# Patient Record
Sex: Female | Born: 1950
Health system: Southern US, Community
[De-identification: ages and names within clinical notes are randomized; demographics above are authoritative.]

## PROBLEM LIST (undated history)

## (undated) DIAGNOSIS — Z8269 Family history of other diseases of the musculoskeletal system and connective tissue: Secondary | ICD-10-CM

## (undated) DIAGNOSIS — H269 Unspecified cataract: Secondary | ICD-10-CM

## (undated) DIAGNOSIS — Z87410 Personal history of cervical dysplasia: Secondary | ICD-10-CM

## (undated) DIAGNOSIS — M19041 Primary osteoarthritis, right hand: Secondary | ICD-10-CM

## (undated) DIAGNOSIS — M3501 Sicca syndrome with keratoconjunctivitis: Secondary | ICD-10-CM

## (undated) DIAGNOSIS — Z8659 Personal history of other mental and behavioral disorders: Secondary | ICD-10-CM

## (undated) DIAGNOSIS — M1990 Unspecified osteoarthritis, unspecified site: Secondary | ICD-10-CM

## (undated) DIAGNOSIS — F32A Depression, unspecified: Secondary | ICD-10-CM

## (undated) DIAGNOSIS — F329 Major depressive disorder, single episode, unspecified: Secondary | ICD-10-CM

## (undated) DIAGNOSIS — R0789 Other chest pain: Secondary | ICD-10-CM

## (undated) DIAGNOSIS — F419 Anxiety disorder, unspecified: Secondary | ICD-10-CM

## (undated) DIAGNOSIS — E785 Hyperlipidemia, unspecified: Secondary | ICD-10-CM

## (undated) DIAGNOSIS — Z87891 Personal history of nicotine dependence: Secondary | ICD-10-CM

## (undated) DIAGNOSIS — T7840XA Allergy, unspecified, initial encounter: Secondary | ICD-10-CM

## (undated) DIAGNOSIS — G4730 Sleep apnea, unspecified: Secondary | ICD-10-CM

## (undated) DIAGNOSIS — J309 Allergic rhinitis, unspecified: Secondary | ICD-10-CM

## (undated) DIAGNOSIS — M797 Fibromyalgia: Secondary | ICD-10-CM

## (undated) DIAGNOSIS — R0602 Shortness of breath: Secondary | ICD-10-CM

## (undated) DIAGNOSIS — E7800 Pure hypercholesterolemia, unspecified: Secondary | ICD-10-CM

## (undated) DIAGNOSIS — Z8639 Personal history of other endocrine, nutritional and metabolic disease: Secondary | ICD-10-CM

## (undated) DIAGNOSIS — K219 Gastro-esophageal reflux disease without esophagitis: Secondary | ICD-10-CM

## (undated) DIAGNOSIS — M7061 Trochanteric bursitis, right hip: Secondary | ICD-10-CM

## (undated) DIAGNOSIS — M19042 Primary osteoarthritis, left hand: Secondary | ICD-10-CM

## (undated) HISTORY — PX: LYMPH NODE BIOPSY: SHX201

## (undated) HISTORY — DX: Other chest pain: R07.89

## (undated) HISTORY — PX: HYSTEROTOMY: SHX1776

## (undated) HISTORY — DX: Allergic rhinitis, unspecified: J30.9

## (undated) HISTORY — DX: Depression, unspecified: F32.A

## (undated) HISTORY — DX: Unspecified osteoarthritis, unspecified site: M19.90

## (undated) HISTORY — DX: Personal history of other endocrine, nutritional and metabolic disease: Z86.39

## (undated) HISTORY — DX: Fibromyalgia: M79.7

## (undated) HISTORY — DX: Primary osteoarthritis, left hand: M19.042

## (undated) HISTORY — DX: Gastro-esophageal reflux disease without esophagitis: K21.9

## (undated) HISTORY — DX: Allergy, unspecified, initial encounter: T78.40XA

## (undated) HISTORY — DX: Primary osteoarthritis, right hand: M19.041

## (undated) HISTORY — DX: Sicca syndrome with keratoconjunctivitis: M35.01

## (undated) HISTORY — DX: Family history of other diseases of the musculoskeletal system and connective tissue: Z82.69

## (undated) HISTORY — DX: Pure hypercholesterolemia, unspecified: E78.00

## (undated) HISTORY — DX: Trochanteric bursitis, right hip: M70.61

## (undated) HISTORY — DX: Shortness of breath: R06.02

## (undated) HISTORY — DX: Unspecified cataract: H26.9

## (undated) HISTORY — DX: Hyperlipidemia, unspecified: E78.5

## (undated) HISTORY — DX: Major depressive disorder, single episode, unspecified: F32.9

## (undated) HISTORY — PX: EYE SURGERY: SHX253

## (undated) HISTORY — DX: Personal history of other mental and behavioral disorders: Z86.59

## (undated) HISTORY — DX: Sleep apnea, unspecified: G47.30

## (undated) HISTORY — DX: Personal history of cervical dysplasia: Z87.410

## (undated) HISTORY — PX: KNEE SURGERY: SHX244

## (undated) HISTORY — DX: Personal history of nicotine dependence: Z87.891

## (undated) HISTORY — DX: Anxiety disorder, unspecified: F41.9

---

## 2010-09-11 ENCOUNTER — Encounter
Admission: RE | Admit: 2010-09-11 | Discharge: 2010-09-11 | Payer: Self-pay | Source: Home / Self Care | Attending: Obstetrics and Gynecology | Admitting: Obstetrics and Gynecology

## 2011-11-05 ENCOUNTER — Other Ambulatory Visit: Payer: Self-pay | Admitting: Obstetrics and Gynecology

## 2011-11-05 DIAGNOSIS — Z1231 Encounter for screening mammogram for malignant neoplasm of breast: Secondary | ICD-10-CM

## 2011-11-05 DIAGNOSIS — Z13820 Encounter for screening for osteoporosis: Secondary | ICD-10-CM

## 2011-11-20 ENCOUNTER — Ambulatory Visit
Admission: RE | Admit: 2011-11-20 | Discharge: 2011-11-20 | Disposition: A | Discharge status: Home / Self Care | Payer: BC Managed Care – PPO | Source: Ambulatory Visit | Attending: Obstetrics and Gynecology | Admitting: Obstetrics and Gynecology

## 2011-11-20 DIAGNOSIS — Z780 Asymptomatic menopausal state: Secondary | ICD-10-CM

## 2011-11-20 DIAGNOSIS — Z1231 Encounter for screening mammogram for malignant neoplasm of breast: Secondary | ICD-10-CM

## 2011-12-09 ENCOUNTER — Other Ambulatory Visit: Payer: Self-pay | Admitting: Orthopaedic Surgery

## 2011-12-09 DIAGNOSIS — M542 Cervicalgia: Secondary | ICD-10-CM

## 2011-12-12 ENCOUNTER — Ambulatory Visit
Admission: RE | Admit: 2011-12-12 | Discharge: 2011-12-12 | Disposition: A | Discharge status: Home / Self Care | Payer: BC Managed Care – PPO | Source: Ambulatory Visit | Attending: Orthopaedic Surgery | Admitting: Orthopaedic Surgery

## 2011-12-12 DIAGNOSIS — M542 Cervicalgia: Secondary | ICD-10-CM

## 2013-03-08 ENCOUNTER — Other Ambulatory Visit: Payer: Self-pay

## 2013-03-08 DIAGNOSIS — Z1231 Encounter for screening mammogram for malignant neoplasm of breast: Secondary | ICD-10-CM

## 2013-04-01 ENCOUNTER — Ambulatory Visit
Admission: RE | Admit: 2013-04-01 | Discharge: 2013-04-01 | Disposition: A | Discharge status: Home / Self Care | Payer: BC Managed Care – PPO | Source: Ambulatory Visit

## 2013-04-01 DIAGNOSIS — Z1231 Encounter for screening mammogram for malignant neoplasm of breast: Secondary | ICD-10-CM

## 2014-04-01 ENCOUNTER — Other Ambulatory Visit: Payer: Self-pay

## 2014-04-01 DIAGNOSIS — Z1231 Encounter for screening mammogram for malignant neoplasm of breast: Secondary | ICD-10-CM

## 2014-04-11 ENCOUNTER — Ambulatory Visit
Admission: RE | Admit: 2014-04-11 | Discharge: 2014-04-11 | Disposition: A | Discharge status: Home / Self Care | Payer: BC Managed Care – PPO | Source: Ambulatory Visit

## 2014-04-11 DIAGNOSIS — Z1231 Encounter for screening mammogram for malignant neoplasm of breast: Secondary | ICD-10-CM

## 2016-04-02 DIAGNOSIS — E7800 Pure hypercholesterolemia, unspecified: Secondary | ICD-10-CM | POA: Insufficient documentation

## 2016-04-02 DIAGNOSIS — F419 Anxiety disorder, unspecified: Secondary | ICD-10-CM

## 2016-04-02 DIAGNOSIS — Z87410 Personal history of cervical dysplasia: Secondary | ICD-10-CM

## 2016-04-02 DIAGNOSIS — E785 Hyperlipidemia, unspecified: Secondary | ICD-10-CM | POA: Insufficient documentation

## 2016-04-02 HISTORY — DX: Anxiety disorder, unspecified: F41.9

## 2016-04-02 HISTORY — DX: Personal history of cervical dysplasia: Z87.410

## 2016-04-02 HISTORY — DX: Pure hypercholesterolemia, unspecified: E78.00

## 2016-04-03 ENCOUNTER — Other Ambulatory Visit: Payer: Self-pay | Admitting: Obstetrics and Gynecology

## 2016-04-03 DIAGNOSIS — Z1231 Encounter for screening mammogram for malignant neoplasm of breast: Secondary | ICD-10-CM

## 2016-04-10 ENCOUNTER — Ambulatory Visit
Admission: RE | Admit: 2016-04-10 | Discharge: 2016-04-10 | Disposition: A | Discharge status: Home / Self Care | Payer: BLUE CROSS/BLUE SHIELD | Source: Ambulatory Visit | Attending: Obstetrics and Gynecology | Admitting: Obstetrics and Gynecology

## 2016-04-10 DIAGNOSIS — Z1231 Encounter for screening mammogram for malignant neoplasm of breast: Secondary | ICD-10-CM

## 2017-09-29 ENCOUNTER — Ambulatory Visit (INDEPENDENT_AMBULATORY_CARE_PROVIDER_SITE_OTHER): Payer: Medicare Other

## 2017-09-29 ENCOUNTER — Encounter (INDEPENDENT_AMBULATORY_CARE_PROVIDER_SITE_OTHER): Payer: Self-pay | Admitting: Orthopaedic Surgery

## 2017-09-29 ENCOUNTER — Ambulatory Visit (INDEPENDENT_AMBULATORY_CARE_PROVIDER_SITE_OTHER): Payer: Medicare Other | Admitting: Orthopaedic Surgery

## 2017-09-29 VITALS — BP 99/67 | HR 59 | Resp 16 | Ht 62.0 in | Wt 160.0 lb

## 2017-09-29 DIAGNOSIS — M545 Low back pain, unspecified: Secondary | ICD-10-CM

## 2017-09-29 NOTE — Progress Notes (Signed)
Office Visit Note   Patient: Kaitlyn Hudson           Date of Birth: [birth year]           MRN: [id number] Visit Date: 09/29/2017              Requested by: No referring provider defined for this encounter. PCP: Nonnie Done., MD   Assessment & Plan: Visit Diagnoses:  1. Lumbar pain     Plan: Recurrent episodes of low back pain associated with right buttock and hip discomfort. Has significant degenerative changes in the lower lumbar spine. I think at this point it's time to obtain an MRI scan. Long discussion with Kaitlyn Hudson who agrees. We'll set this up. plan to see her back  thereafter and consider treatment options.  Follow-Up Instructions: No Follow-up on file.   Orders:  Orders Placed This Encounter  Procedures  . XR Lumbar Spine 2-3 Views  . MR Lumbar Spine w/o contrast   No orders of the defined types were placed in this encounter.     Procedures: No procedures performed   Clinical Data: No additional findings.   Subjective: Chief Complaint  Patient presents with  . Right Hip - Pain    Kaitlyn Hudson is a 67 y o here today for chronic Right hip and lumbar pain.   Kaitlyn Hudson has been seen on number of occasions over the past several years for problems referable to her back buttock and right hip. Cortisone injection over the greater trochanter her hip, provided little if any relief. She has trouble when she sits for a length of time. Her pain seems to originate somewhere in the lower lumbar spine and radiates to the right side of her buttock and her right leg as far distally as the right knee. She does not have any groin pain. Does not walk with a limp. Prior x-rays of her pelvis did not reveal any degenerative changes of hip joints. She was last seen in 2016 when she had the right trochanteric per the dictation there were some degenerative changes at L5-S1. She has tried over-the-counter medicines and a back support. Pain is reached a point where it really compromises her  activities. She's been having trouble for over 3-4 years with no prior further advanced diagnostic studies.  HPI  Review of Systems  Constitutional: Negative for chills, fatigue and fever.  Eyes: Negative for itching.  Respiratory: Negative for chest tightness and shortness of breath.   Cardiovascular: Negative for chest pain, palpitations and leg swelling.  Gastrointestinal: Negative for blood in stool, constipation and diarrhea.  Endocrine: Negative for polyuria.  Genitourinary: Negative for dysuria.  Musculoskeletal: Positive for arthralgias and back pain. Negative for joint swelling, neck pain and neck stiffness.  Allergic/Immunologic: Negative for immunocompromised state.  Neurological: Negative for dizziness and numbness.  Hematological: Does not bruise/bleed easily.  Psychiatric/Behavioral: Positive for sleep disturbance. The patient is not nervous/anxious.      Objective: Vital Signs: BP 99/67   Pulse (!) 59   Resp 16   Ht 5\' 2"  (1.575 m)   Wt 160 lb (72.6 kg)   BMI 29.26 kg/m   Physical Exam  Ortho Exam awake alert and oriented 3. Comfortable sitting. Pleasant. Straight leg raise minimally positive for back pain and not leg pain more on the right than the left. Reflexes symmetrical. Motor and sensory exam intact. No significant tenderness over either greater trochanter. Painless range of motion of both hips. Good strength.  Specialty  Comments:  No specialty comments available.  Imaging: Xr Lumbar Spine 2-3 Views  Result Date: 09/29/2017 Films of the lumbar spine obtained in 2 projections.  no films for comparison. There is significant decrease in the disc space height at L5-S1. Diffuse calcification the abdominal aorta without aneurysmal dilatation. No evidence of listhesis. Diffuse facet joint sclerosis at L5-S1    PMFS History: There are no active problems to display for this patient.  Past Medical History:  Diagnosis Date  . Osteoarthritis     History  reviewed. No pertinent family history.  Past Surgical History:  Procedure Laterality Date  . HYSTEROTOMY    . KNEE ARTHROSCOPY    . KNEE SURGERY     Social History   Occupational History  . Not on file  Tobacco Use  . Smoking status: Former Games developermoker  . Smokeless tobacco: Never Used  Substance and Sexual Activity  . Alcohol use: No    Frequency: Never  . Drug use: No  . Sexual activity: Not on file

## 2017-10-07 ENCOUNTER — Ambulatory Visit
Admission: RE | Admit: 2017-10-07 | Discharge: 2017-10-07 | Disposition: A | Discharge status: Home / Self Care | Payer: Medicare Other | Source: Ambulatory Visit | Attending: Orthopaedic Surgery | Admitting: Orthopaedic Surgery

## 2017-10-07 DIAGNOSIS — M545 Low back pain, unspecified: Secondary | ICD-10-CM

## 2017-10-09 ENCOUNTER — Other Ambulatory Visit (INDEPENDENT_AMBULATORY_CARE_PROVIDER_SITE_OTHER): Payer: Self-pay | Admitting: *Deleted

## 2017-10-09 ENCOUNTER — Encounter (INDEPENDENT_AMBULATORY_CARE_PROVIDER_SITE_OTHER): Payer: Self-pay | Admitting: Orthopaedic Surgery

## 2017-10-09 ENCOUNTER — Ambulatory Visit (INDEPENDENT_AMBULATORY_CARE_PROVIDER_SITE_OTHER): Payer: Medicare Other | Admitting: Orthopaedic Surgery

## 2017-10-09 DIAGNOSIS — M545 Low back pain, unspecified: Secondary | ICD-10-CM

## 2017-10-09 DIAGNOSIS — M25551 Pain in right hip: Secondary | ICD-10-CM | POA: Diagnosis not present

## 2017-10-09 MED ORDER — LIDOCAINE HCL 1 % IJ SOLN
2.0000 mL | INTRAMUSCULAR | Status: AC | PRN
  Administered 2017-10-09: 2 mL

## 2017-10-09 MED ORDER — BUPIVACAINE HCL 0.5 % IJ SOLN
2.0000 mL | INTRAMUSCULAR | Status: AC | PRN
  Administered 2017-10-09: 2 mL via INTRA_ARTICULAR

## 2017-10-09 MED ORDER — METHYLPREDNISOLONE ACETATE 40 MG/ML IJ SUSP
80.0000 mg | INTRAMUSCULAR | Status: AC | PRN
  Administered 2017-10-09: 80 mg

## 2017-10-09 NOTE — Progress Notes (Signed)
Office Visit Note   Patient: Kaitlyn Hudson           Date of Birth: 04/07/1951           MRN: [id number] Visit Date: 10/09/2017              Requested by: Nonnie DoneSlatosky, John J., MD [address] [place], [place] [id number] PCP: Nonnie DoneSlatosky, John J., MD   Assessment & Plan: Visit Diagnoses:  1. Pain of right hip joint     Plan: Pain right hip consistent with greater trochanteric bursitis. Has had a prior diagnosis of the same with successful cortisone injections. MRI of lumbar spine reveals some mild degenerative changes throughout the lumbar spine I discussed this in detail with Kaitlyn Hudson. She like to try a cortisone injection over the greater trochanter.  Follow-Up Instructions: Return if symptoms worsen or fail to improve.   Orders:  No orders of the defined types were placed in this encounter.  No orders of the defined types were placed in this encounter.     Procedures: Large Joint Inj: R greater trochanter on 10/09/2017 3:49 PM Indications: pain and diagnostic evaluation Details: 25 G 1.5 in needle  Arthrogram: No  Medications: 2 mL lidocaine 1 %; 2 mL bupivacaine 0.5 %; 80 mg methylPREDNISolone acetate 40 MG/ML Procedure, treatment alternatives, risks and benefits explained, specific risks discussed. Consent was given by the patient. Immediately prior to procedure a time out was called to verify the correct patient, procedure, equipment, support staff and site/side marked as required. Patient was prepped and draped in the usual sterile fashion.       Clinical Data: No additional findings.   Subjective: No chief complaint on file. Has had some chronic issues with her lumbar spine. I obtained an MRI scan and she's here for review. This demonstrates some mild degenerative changes throughout the lumbar spine but greater in the lower spine region. She also was had recurrent pain directly over the greater trochanter of her right hip. She believes that this is recurrent  Bursitis.  She's had cortisone in an area before with excellent relief. Spend 4 years  HPI  Review of Systems   Objective: Vital Signs: There were no vitals taken for this visit.  Physical Exam  Ortho Exam awake alert and oriented 3. Comfortable sitting. Tenderness directly over the greater trochanter of her right hip. No pain in the groin with range of motion. Straight leg raise negative. No percussible tenderness lumbar spine. Skin intact. Neurovascular exam intact  Specialty Comments:  No specialty comments available.  Imaging: No results found.   PMFS History: There are no active problems to display for this patient.  Past Medical History:  Diagnosis Date  . Osteoarthritis     History reviewed. No pertinent family history.  Past Surgical History:  Procedure Laterality Date  . HYSTEROTOMY    . KNEE ARTHROSCOPY    . KNEE SURGERY     Social History   Occupational History  . Not on file  Tobacco Use  . Smoking status: Former Games developermoker  . Smokeless tobacco: Never Used  Substance and Sexual Activity  . Alcohol use: No    Frequency: Never  . Drug use: No  . Sexual activity: Not on file     Valeria BatmanPeter W Kaede Clendenen, MD   Note - This record has been created using [employer] software.  Chart creation errors have been sought, but may not always  have been located. Such creation errors do not reflect on  the standard of medical care.

## 2018-01-02 ENCOUNTER — Ambulatory Visit (INDEPENDENT_AMBULATORY_CARE_PROVIDER_SITE_OTHER): Payer: Medicare Other | Admitting: Orthopaedic Surgery

## 2018-01-09 ENCOUNTER — Encounter (INDEPENDENT_AMBULATORY_CARE_PROVIDER_SITE_OTHER): Payer: Self-pay | Admitting: Orthopaedic Surgery

## 2018-01-09 ENCOUNTER — Ambulatory Visit (INDEPENDENT_AMBULATORY_CARE_PROVIDER_SITE_OTHER): Payer: Medicare Other | Admitting: Orthopaedic Surgery

## 2018-01-09 VITALS — BP 129/70 | HR 75 | Resp 18 | Ht 63.0 in | Wt 170.0 lb

## 2018-01-09 DIAGNOSIS — M7061 Trochanteric bursitis, right hip: Secondary | ICD-10-CM | POA: Diagnosis not present

## 2018-01-09 HISTORY — DX: Trochanteric bursitis, right hip: M70.61

## 2018-01-09 MED ORDER — LIDOCAINE HCL 1 % IJ SOLN
2.0000 mL | INTRAMUSCULAR | Status: AC | PRN
  Administered 2018-01-09: 2 mL

## 2018-01-09 MED ORDER — METHYLPREDNISOLONE ACETATE 40 MG/ML IJ SUSP
80.0000 mg | INTRAMUSCULAR | Status: AC | PRN
  Administered 2018-01-09: 80 mg

## 2018-01-09 MED ORDER — BUPIVACAINE HCL 0.5 % IJ SOLN
2.0000 mL | INTRAMUSCULAR | Status: AC | PRN
Start: 2018-01-09 — End: 2018-01-09
  Administered 2018-01-09: 2 mL via INTRA_ARTICULAR

## 2018-01-09 NOTE — Progress Notes (Signed)
Office Visit Note   Patient: Kaitlyn Hudson           Date of Birth: 17-Jan-1951           MRN: [id number] Visit Date: 01/09/2018              Requested by: Nonnie Done., MD [address] [place], [place] [address] PCP: Nonnie Done., MD   Assessment & Plan: Visit  Diagnoses:  1. Trochanteric bursitis, right hip     Plan: Recurrent pain over the greater trochanter right hip.  Excellent response to cortisone 3 months ago.  Would like another injection.  Prior MRI scan of lumbar spine reveals some mild spondylosis and foraminal stenosis.. Limited views of right hip did not reveal any degenerative change.  No recent history of injury or trauma.  Will reinject hip and return as needed  Follow-Up Instructions: Return if symptoms worsen or fail to improve.   Orders:  No orders of the defined types were placed in this encounter.  No orders of the defined types were placed in this encounter.     Procedures: Large Joint Inj: R greater trochanter on 01/09/2018 11:28 AM Indications: pain and diagnostic evaluation Details: 25 G 1.5 in needle  Arthrogram: No  Medications: 2 mL lidocaine 1 %; 2 mL bupivacaine 0.5 %; 80 mg methylPREDNISolone acetate 40 MG/ML Procedure, treatment alternatives, risks and benefits explained, specific risks discussed. Consent was given by the patient. Immediately prior to procedure a time out was called to verify the correct patient, procedure, equipment, support staff and site/side marked as required. Patient was prepped and draped in the usual sterile fashion.       Clinical Data: No additional findings.   Subjective: Chief Complaint  Patient presents with  . Right Hip - Pain  . Follow-up    R HIP INJECTION, LAST ONE WAS FEB/2019, NO INJURY  Recurrent pain over the greater trochanter right hip.  Prior injection 3 months ago alleviated all of her pain.  Just had recurrent pain without injury or trauma.  No numbness or tingling.  No related groin pain.  HPI  Review of Systems  Constitutional: Negative for fatigue and fever.  HENT: Negative for ear pain.   Eyes: Negative for pain.  Respiratory: Negative for cough and shortness of breath.   Cardiovascular: Negative for leg swelling.  Gastrointestinal: Negative for  constipation and diarrhea.  Genitourinary: Negative for difficulty urinating.  Musculoskeletal: Positive for back pain and neck pain.  Skin: Negative for rash.  Allergic/Immunologic: Negative for food allergies.  Neurological: Negative for weakness and numbness.  Hematological: Does not bruise/bleed easily.  Psychiatric/Behavioral: Positive for sleep disturbance.     Objective: Vital Signs: BP 129/70 (BP Location: Left Arm, Patient Position: Sitting, Cuff Size: Normal)   Pulse 75   Resp 18   Ht  (1.6 m)   Wt 170 lb (77.1 kg)   BMI 30.11 kg/m   Physical Exam  Constitutional: She is oriented to person, place, and time. She appears well-developed and well-nourished.  HENT:  Mouth/Throat: Oropharynx is clear and moist.  Eyes: Pupils are equal, round, and reactive to light. EOM are normal.  Pulmonary/Chest: Effort normal.  Neurological: She is alert and oriented to person, place, and time.  Skin: Skin is warm and dry.  Psychiatric: She has a normal mood and affect. Her behavior is normal.    Ortho Exam awake alert and oriented x3.  Comfortable sitting.  No pain with internal/external  rotation of right hip.  Tenderness directly over the greater trochanter right hip skin intact.  Straight leg raise negative.  No percussible tenderness of lumbar spine  Specialty Comments:  No specialty comments available.  Imaging: No results found.   PMFS History: Patient Active Problem List   Diagnosis Date Noted  . Trochanteric bursitis, right hip 01/09/2018   Past Medical History:  Diagnosis Date  . Osteoarthritis     History reviewed. No pertinent family history.  Past Surgical History:  Procedure Laterality Date  . HYSTEROTOMY    . KNEE ARTHROSCOPY    . KNEE SURGERY     Social History   Occupational History  . Not on file  Tobacco Use  . Smoking status: Former Games developer  . Smokeless tobacco: Current User    Types: Chew  Substance and Sexual Activity  . Alcohol use: No      Frequency: Never  . Drug use: No  . Sexual activity: Not on file

## 2018-03-04 NOTE — Progress Notes (Signed)
Office Visit Note  Patient: Kaitlyn Hudson             Date of Birth: 1951-03-22         Kaitlyn Hudson  MRN: [id number]             PCP: Nonnie DoneSlatosky, John J., MD Referring: Nonnie DoneSlatosky, John J., MD Visit Date: 03/16/2018 Occupation: @GUAROCC @    Subjective:  Pain in multiple joints and muscles.  History of Present Illness: Kaitlyn Hudson is a 67 y.o. female seen in consultation per request of her PCP.  According to patient she has had history of fatigue for at least 2 years.  She is also noticed some arthritic changes in her hands over the years and had discomfort in her bilateral CMC joints.  She states she has been having joint pain which moves around for a while.  Approximately a month ago she started experiencing some pain in her neck, jawline, across her shoulders chest area in her rib cage.  She states the pain moves around except that the right shoulder pain and right arm pain has been persistent.  She was seen by her PCP about a month ago when she had labs which were abnormal and she was referred to me for further evaluation.  She also reports having weight gain of 30 pounds in the last 1 year.  She has history of chronic insomnia.  There is positive family history of systemic lupus in her mother.  She was treated with the statins in the past which she has not taken in the last 2 years.  She denies any history of joint swelling.    Activities of Daily Living:  Patient reports morning stiffness for 5  minutes.   Patient Reports nocturnal pain.  Difficulty dressing/grooming: Denies Difficulty climbing stairs: Denies Difficulty getting out of chair: Denies Difficulty using hands for taps, buttons, cutlery, and/or writing: Denies   Review of Systems  Constitutional: Positive for fatigue and weight gain.  HENT: Negative for mouth sores, mouth dryness and nose dryness.   Eyes: Positive for dryness. Negative for pain and visual disturbance.  Respiratory: Negative for cough, hemoptysis, shortness of breath  and difficulty breathing.   Cardiovascular: Negative for chest pain, palpitations, hypertension and swelling in legs/feet.  Gastrointestinal: Negative for blood in stool, constipation and diarrhea.  Endocrine: Negative for increased urination.  Genitourinary: Negative for painful urination.  Musculoskeletal: Positive for arthralgias, joint pain, myalgias, morning stiffness, muscle tenderness and myalgias. Negative for joint swelling and muscle weakness.  Skin: Positive for hair loss and sensitivity to sunlight. Negative for color change, pallor, rash, nodules/bumps, skin tightness and ulcers.  Allergic/Immunologic: Negative for susceptible to infections.  Neurological: Positive for headaches. Negative for dizziness, numbness and weakness.  Hematological: Negative for swollen glands.  Psychiatric/Behavioral: Positive for depressed mood. Negative for sleep disturbance. The patient is nervous/anxious.     PMFS History:  Patient Active Problem List   Diagnosis Date Noted  . History of depression 03/16/2018  . History of hyperlipidemia 03/16/2018  . Trochanteric bursitis, right hip 01/09/2018    Past Medical History:  Diagnosis Date  . Osteoarthritis     Family History  Problem Relation Age of Onset  . Lupus Mother   . Osteoarthritis Mother   . Polymyalgia rheumatica Mother   . Osteoarthritis Father    Past Surgical History:  Procedure Laterality Date  . HYSTEROTOMY    . KNEE SURGERY Right   . LYMPH NODE BIOPSY     groin  Social History   Social History Narrative  . Not on file     Objective: Vital Signs: BP 119/74 (BP Location: Right Arm, Patient Position: Sitting, Cuff Size: Normal)   Pulse 74   Resp 14   Ht 5' 2.6" (1.59 m)   Wt 190 lb (86.2 kg)   BMI 34.09 kg/m    Physical Exam  Constitutional: She is oriented to person, place, and time. She appears well-developed and well-nourished.  HENT:  Head: Normocephalic and atraumatic.  Eyes: Conjunctivae and EOM are  normal.  Neck: Normal range of motion.  Cardiovascular: Normal rate, regular rhythm, normal heart sounds and intact distal pulses.  Pulmonary/Chest: Effort normal and breath sounds normal.  Abdominal: Soft. Bowel sounds are normal.  Lymphadenopathy:    She has no cervical adenopathy.  Neurological: She is alert and oriented to person, place, and time.  Skin: Skin is warm and dry. Capillary refill takes less than 2 seconds.  She has hyperemia over bilateral upper extremities.  Psychiatric: She has a normal mood and affect. Her behavior is normal.  Nursing note and vitals reviewed.    Musculoskeletal Exam: C-spine thoracic lumbar spine good range of motion.  She has some discomfort with range of motion of her lumbar spine.  She has discomfort range of motion of her right shoulder.  Elbow joints wrist joints were in good range of motion.  She has tenderness over bilateral CMC joints.  She has DIP and PIP thickening in her bilateral hands.  No synovitis was noted.  Hip joints and knee joints were in good range of motion with no synovitis.  She had tenderness over right trochanteric area.  CDAI Exam: No CDAI exam completed.    Investigation: Findings:  02/20/18: Uric acid 4.7 CBC WNL, sed rate 50, RF 36.4 CRP 1, [place] spotted fever IgG negative, CMP WNL, TSH 3.230, ANA positive, dsDNA 1, RNP 0.6, Smith negative, SCL 70-, Ro positive, La positive, anti-chromatin negative, anti-Jo 1-, anticentromere antibodies negative, parvo B19 negative, Lyme disease antibodies negative     Imaging: Xr Hand 2 View Left  Result Date: 03/16/2018 All PIP and DIP narrowing was noted.  Subluxation of third PIP joint was noted.  No MCP PIP or intercarpal joint space narrowing was noted.  CMC narrowing was noted. Impression: These findings are consistent with osteoarthritis of the hand.  Xr Hand 2 View Right  Result Date: 03/16/2018 All PIP and DIP narrowing was noted.  No MCP joint changes were noted.  No  intercarpal radiocarpal joint space narrowing was noted.  Severe CMC narrowing and spurring was noted. Impression: These findings are consistent with osteoarthritis of the hand.  Xr Shoulder Right  Result Date: 03/16/2018 No glenohumeral joint or acromioclavicular joint space narrowing was noted.  No chondrocalcinosis was noted.   Speciality Comments: No specialty comments available.    Procedures:  No procedures performed Allergies: Codeine   Assessment / Plan:     Visit diagnosis- Positive ANA- 02/20/18: Uric acid 4.7 CBC WNL, sed rate 50, RF 36.4 CRP 1, ANA +, SSA positive, SSB positive.  Patient gives history of fatigue and sicca symptoms.  She also has history of polyarthralgia.  She gives history of polymyalgia.  There is positive family history of systemic lupus in her mother.  She states her mother has been treated with methotrexate.  She had some hyperemia on her extremities.  No synovitis was noted.  I will obtain AVISE study.  Pain in both hands -the clinical findings  are consistent with osteoarthritis.  Plan: XR Hand 2 View Right, XR Hand 2 View Left.  The x-rays were consistent with osteoarthritis of bilateral hands.  Chronic right shoulder pain - Plan: XR Shoulder Right.  The x-ray of the shoulder joint was unremarkable.  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  I do not see any synovitis on examination.  Myalgia-she also complains of muscle pain which moves around.  Other fatigue-she has been experiencing fatigue for some time.  Trochanteric bursitis, right hip-IT band stretches were discussed.  Shortness of breath -she is concerned about shortness of breath.  She states she is was a former smoker.  She would like a referral to pulmonary.  Plan: DG Chest 2 View  Former smoker  History of hyperlipidemia-patient was treated with statins in the past.  She states she has not taken any statins in 2 years.  History of  depression  Dyslipidemia  Family history of systemic lupus erythematosus (SLE) in mother -her mother is treated with methotrexate.   Orders: Orders Placed This Encounter  Procedures  . XR Shoulder Right  . XR Hand 2 View Right  . XR Hand 2 View Left  . DG Chest 2 View  . CK  . Urinalysis, Routine w reflex microscopic  . TSH  . Lupus Anticoagulant Eval w/Reflex  . Serum protein electrophoresis with reflex  . Glucose 6 phosphate dehydrogenase  . Hepatitis B core antibody, IgM  . Hepatitis B surface antigen  . Hepatitis C antibody  . IgG, IgA, IgM  . Sedimentation rate  . Ambulatory referral to Pulmonology   No orders of the defined types were placed in this encounter.   Face-to-face time spent with patient was 50 minutes. Greater than 50% of time was spent in counseling and coordination of care.  Follow-Up Instructions: Return for Autoimmune disease.   Pollyann Savoy, MD  Note - This record has been created using [employer].  Chart creation errors have been sought, but may not always  have been located. Such creation errors do not reflect on  the standard of medical care.

## 2018-03-16 ENCOUNTER — Ambulatory Visit (INDEPENDENT_AMBULATORY_CARE_PROVIDER_SITE_OTHER): Payer: Medicare Other | Admitting: Rheumatology

## 2018-03-16 ENCOUNTER — Encounter: Payer: Self-pay | Admitting: Rheumatology

## 2018-03-16 ENCOUNTER — Ambulatory Visit (INDEPENDENT_AMBULATORY_CARE_PROVIDER_SITE_OTHER): Payer: Self-pay

## 2018-03-16 ENCOUNTER — Ambulatory Visit (INDEPENDENT_AMBULATORY_CARE_PROVIDER_SITE_OTHER): Payer: Medicare Other

## 2018-03-16 VITALS — BP 119/74 | HR 74 | Resp 14 | Ht 62.6 in | Wt 190.0 lb

## 2018-03-16 DIAGNOSIS — Z8659 Personal history of other mental and behavioral disorders: Secondary | ICD-10-CM

## 2018-03-16 DIAGNOSIS — M7061 Trochanteric bursitis, right hip: Secondary | ICD-10-CM

## 2018-03-16 DIAGNOSIS — M79641 Pain in right hand: Secondary | ICD-10-CM

## 2018-03-16 DIAGNOSIS — M79642 Pain in left hand: Secondary | ICD-10-CM

## 2018-03-16 DIAGNOSIS — M791 Myalgia, unspecified site: Secondary | ICD-10-CM

## 2018-03-16 DIAGNOSIS — G8929 Other chronic pain: Secondary | ICD-10-CM

## 2018-03-16 DIAGNOSIS — Z8269 Family history of other diseases of the musculoskeletal system and connective tissue: Secondary | ICD-10-CM

## 2018-03-16 DIAGNOSIS — M2550 Pain in unspecified joint: Secondary | ICD-10-CM | POA: Diagnosis not present

## 2018-03-16 DIAGNOSIS — Z87891 Personal history of nicotine dependence: Secondary | ICD-10-CM | POA: Diagnosis not present

## 2018-03-16 DIAGNOSIS — Z8639 Personal history of other endocrine, nutritional and metabolic disease: Secondary | ICD-10-CM | POA: Insufficient documentation

## 2018-03-16 DIAGNOSIS — E785 Hyperlipidemia, unspecified: Secondary | ICD-10-CM | POA: Diagnosis not present

## 2018-03-16 DIAGNOSIS — M25511 Pain in right shoulder: Secondary | ICD-10-CM

## 2018-03-16 DIAGNOSIS — R0602 Shortness of breath: Secondary | ICD-10-CM

## 2018-03-16 DIAGNOSIS — R768 Other specified abnormal immunological findings in serum: Secondary | ICD-10-CM

## 2018-03-16 DIAGNOSIS — R5383 Other fatigue: Secondary | ICD-10-CM

## 2018-03-16 HISTORY — DX: Personal history of other endocrine, nutritional and metabolic disease: Z86.39

## 2018-03-16 HISTORY — DX: Personal history of other mental and behavioral disorders: Z86.59

## 2018-03-16 NOTE — Patient Instructions (Signed)
Iliotibial Band Syndrome Rehab  Ask your health care provider which exercises are safe for you. Do exercises exactly as told by your health care provider and adjust them as directed. It is normal to feel mild stretching, pulling, tightness, or discomfort as you do these exercises, but you should stop right away if you feel sudden pain or your pain gets worse. Do not begin these exercises until told by your health care provider.  Stretching and range of motion exercises  These exercises warm up your muscles and joints and improve the movement and flexibility of your hip and pelvis.  Exercise A: Quadriceps, prone    1. Lie on your abdomen on a firm surface, such as a bed or padded floor.  2. Bend your left / right knee and hold your ankle. If you cannot reach your ankle or pant leg, loop a belt around your foot and grab the belt instead.  3. Gently pull your heel toward your buttocks. Your knee should not slide out to the side. You should feel a stretch in the front of your thigh and knee.  4. Hold this position for __________ seconds.  Repeat __________ times. Complete this stretch __________ times a day.  Exercise B: Iliotibial band    1. Lie on your side with your left / right leg in the top position.  2. Bend both of your knees and grab your left / right ankle. Stretch out your bottom arm to help you balance.  3. Slowly bring your top knee back so your thigh goes behind your trunk.  4. Slowly lower your top leg toward the floor until you feel a gentle stretch on the outside of your left / right hip and thigh. If you do not feel a stretch and your knee will not fall farther, place the heel of your other foot on top of your knee and pull your knee down toward the floor with your foot.  5. Hold this position for __________ seconds.  Repeat __________ times. Complete this stretch __________ times a day.  Strengthening exercises  These exercises build strength and endurance in your hip and pelvis. Endurance is the  ability to use your muscles for a long time, even after they get tired.  Exercise C: Straight leg raises (  hip abductors)  1. Lie on your side with your left / right leg in the top position. Lie so your head, shoulder, knee, and hip line up. You may bend your bottom knee to help you balance.  2. Roll your hips slightly forward so your hips are stacked directly over each other and your left / right knee is facing forward.  3. Tense the muscles in your outer thigh and lift your top leg 4-6 inches (10-15 cm).  4. Hold this position for __________ seconds.  5. Slowly return to the starting position. Let your muscles relax completely before doing another repetition.  Repeat __________ times. Complete this exercise __________ times a day.  Exercise D: Straight leg raises (  hip extensors)  1. Lie on your abdomen on your bed or a firm surface. You can put a pillow under your hips if that is more comfortable.  2. Bend your left / right knee so your foot is straight up in the air.  3. Squeeze your buttock muscles and lift your left / right thigh off the bed. Do not let your back arch.  4. Tense this muscle as hard as you can without increasing any knee pain.    5. Hold this position for __________ seconds.  6. Slowly lower your leg to the starting position and allow it to relax completely.  Repeat __________ times. Complete this exercise __________ times a day.  Exercise E: Hip hike  1. Stand sideways on a bottom step. Stand on your left / right leg with your other foot unsupported next to the step. You can hold onto the railing or wall if needed for balance.  2. Keep your knees straight and your torso square. Then, lift your left / right hip up toward the ceiling.  3. Slowly let your left / right hip lower toward the floor, past the starting position. Your foot should get closer to the floor. Do not lean or bend your knees.  Repeat __________ times. Complete this exercise __________ times a day.  This information is not  intended to replace advice given to you by your health care provider. Make sure you discuss any questions you have with your health care provider.  Document Released: 08/19/2005 Document Revised: 04/23/2016 Document Reviewed: 07/21/2015  Elsevier Interactive Patient Education © 2018 [employer]

## 2018-03-18 LAB — PROTEIN ELECTROPHORESIS, SERUM, WITH REFLEX
Albumin ELP: 4.6 g/dL (ref 3.8–4.8)
Alpha 1: 0.2 g/dL (ref 0.2–0.3)
Alpha 2: 0.6 g/dL (ref 0.5–0.9)
BETA 2: 0.3 g/dL (ref 0.2–0.5)
Beta Globulin: 0.4 g/dL (ref 0.4–0.6)
GAMMA GLOBULIN: 1.6 g/dL (ref 0.8–1.7)
Total Protein: 7.8 g/dL (ref 6.1–8.1)

## 2018-03-18 LAB — TSH: TSH: 3.64 mIU/L (ref 0.40–4.50)

## 2018-03-18 LAB — LUPUS ANTICOAGULANT EVAL W/ REFLEX
DRVVT: 32 s (ref <=45)
PTT-LA SCREEN: 33 s (ref <=40)

## 2018-03-18 LAB — HEPATITIS B SURFACE ANTIGEN: Hepatitis B Surface Ag: NONREACTIVE

## 2018-03-18 LAB — URINALYSIS, ROUTINE W REFLEX MICROSCOPIC
BILIRUBIN URINE: NEGATIVE
Glucose, UA: NEGATIVE
HGB URINE DIPSTICK: NEGATIVE
KETONES UR: NEGATIVE
Leukocytes, UA: NEGATIVE
Nitrite: NEGATIVE
Protein, ur: NEGATIVE
Specific Gravity, Urine: 1.014 (ref 1.001–1.03)

## 2018-03-18 LAB — GLUCOSE 6 PHOSPHATE DEHYDROGENASE: G-6PDH: 13.7 U/g Hgb (ref 7.0–20.5)

## 2018-03-18 LAB — HEPATITIS C ANTIBODY
Hepatitis C Ab: NONREACTIVE
SIGNAL TO CUT-OFF: 0.02 (ref <1.00)

## 2018-03-18 LAB — HEPATITIS B CORE ANTIBODY, IGM: Hep B C IgM: NONREACTIVE

## 2018-03-18 LAB — SEDIMENTATION RATE: SED RATE: 6 mm/h (ref 0–30)

## 2018-03-18 LAB — CK: Total CK: 50 U/L (ref 29–143)

## 2018-03-18 LAB — IGG, IGA, IGM
IgG (Immunoglobin G), Serum: 1748 mg/dL — ABNORMAL HIGH (ref 600–1540)
IgM, Serum: 163 mg/dL (ref 50–300)
Immunoglobulin A: 228 mg/dL (ref 20–320)

## 2018-03-18 NOTE — Progress Notes (Signed)
I will discuss at the follow-up visit.

## 2018-03-25 ENCOUNTER — Ambulatory Visit: Payer: Medicare Other | Admitting: Rheumatology

## 2018-04-03 ENCOUNTER — Ambulatory Visit (INDEPENDENT_AMBULATORY_CARE_PROVIDER_SITE_OTHER): Payer: Medicare Other | Admitting: Pulmonary Disease

## 2018-04-03 ENCOUNTER — Ambulatory Visit (INDEPENDENT_AMBULATORY_CARE_PROVIDER_SITE_OTHER)
Admission: RE | Admit: 2018-04-03 | Discharge: 2018-04-03 | Disposition: A | Discharge status: Home / Self Care | Payer: Medicare Other | Source: Ambulatory Visit | Attending: Pulmonary Disease | Admitting: Pulmonary Disease

## 2018-04-03 ENCOUNTER — Encounter: Payer: Self-pay | Admitting: Pulmonary Disease

## 2018-04-03 VITALS — BP 122/72 | HR 80 | Ht 62.5 in | Wt 189.0 lb

## 2018-04-03 DIAGNOSIS — R0602 Shortness of breath: Secondary | ICD-10-CM

## 2018-04-03 DIAGNOSIS — G4710 Hypersomnia, unspecified: Secondary | ICD-10-CM

## 2018-04-03 NOTE — Progress Notes (Signed)
Kaitlyn Hudson    [id number]    1951-08-22  Primary Care Physician:Slatosky, Marshall Cork., MD  Referring Physician: Bo Merino, MD [address] [place], [place] [address]  Chief complaint:  Patient being evaluated for possible obstructive lung disease, history of shortness of breath at rest and with activity Progressive symptoms with shortness of breath with mild activity  HPI:  Symptoms have been for about 6 months  Been evaluated for possible autoimmune disorder History of lupus in her mother She does have joint pains, arthralgias, myalgias, sensitivity to sunlight  1 block of walking has become challenging for her  She has become less active and has gained about 30 pounds  Nonrestorative sleep, waking up feeling exhausted, excessive daytime sleepiness  She denies any significant central chest pain or pleuritic chest pains  She does have a history of depression, trochanteric bursitis  pet: Has a dog Occupation: No pertinent occupational history Exposures: No skin exposure history Smoking history: She did smoke in the past quit in 2012 Travel history: No recent travel Relevant family history: Mother had lupus  Outpatient Encounter Medications as of 04/03/2018  Medication Sig  . ibuprofen (ADVIL) 200 MG tablet Take 800 mg by mouth every 6 (six) hours as needed.  . pravastatin (PRAVACHOL) 10 MG tablet Take 10 mg by mouth daily.  . rosuvastatin (CRESTOR) 10 MG tablet Take 10 mg by mouth daily.  . sertraline (ZOLOFT) 25 MG tablet Take 25 mg by mouth daily.   No facility-administered encounter medications on file as of 04/03/2018.     Allergies as of 04/03/2018 - Review Complete 04/03/2018  Allergen Reaction Noted  . Codeine  09/29/2017    Past Medical History:  Diagnosis Date  . Osteoarthritis     Past Surgical History:  Procedure Laterality Date  . HYSTEROTOMY    . KNEE SURGERY Right   . LYMPH NODE BIOPSY     groin     Family History    Problem Relation Age of Onset  . Lupus Mother   . Osteoarthritis Mother   . Polymyalgia rheumatica Mother   . Osteoarthritis Father     Social History   Socioeconomic History  . Marital status: Single    Spouse name: Not on file  . Number of children: Not on file  . Years of education: Not on file  . Highest education level: Not on file  Occupational History  . Not on file  Social Needs  . Financial resource strain: Not on file  . Food insecurity:    Worry: Not on file    Inability: Not on file  . Transportation needs:    Medical: Not on file    Non-medical: Not on file  Tobacco Use  . Smoking status: Former Smoker    Packs/day: 0.50    Years: 30.00    Pack years: 15.00    Types: Cigarettes    Last attempt to quit: 09/02/2010    Years since quitting: 7.5  . Smokeless tobacco: Never Used  Substance and Sexual Activity  . Alcohol use: No    Frequency: Never  . Drug use: Never  . Sexual activity: Not on file  Lifestyle  . Physical activity:    Days per week: Not on file    Minutes per session: Not on file  . Stress: Not on file  Relationships  . Social connections:    Talks on phone: Not on file    Gets together: Not  on file    Attends religious service: Not on file    Active member of club or organization: Not on file    Attends meetings of clubs or organizations: Not on file    Relationship status: Not on file  . Intimate partner violence:    Fear of current or ex partner: Not on file    Emotionally abused: Not on file    Physically abused: Not on file    Forced sexual activity: Not on file  Other Topics Concern  . Not on file  Social History Narrative  . Not on file    Review of Systems  Constitutional: Positive for activity change and fatigue. Negative for fever.  HENT: Negative.   Eyes: Negative.   Respiratory: Positive for shortness of breath. Negative for wheezing.   Cardiovascular: Negative for chest pain and leg swelling.  Gastrointestinal:  Negative.   Endocrine: Negative.   Genitourinary: Negative.   Musculoskeletal: Positive for arthralgias and myalgias.  Skin: Negative.   Allergic/Immunologic: Negative.   Neurological: Negative.   Hematological: Negative.   Psychiatric/Behavioral: Negative.     Vitals:   04/03/18 1449  BP: 122/72  Pulse: 80  SpO2: 96%     Physical Exam  Constitutional: She is oriented to person, place, and time. She appears well-developed and well-nourished.  HENT:  Head: Normocephalic and atraumatic.  Eyes: Pupils are equal, round, and reactive to light. Conjunctivae are normal. Right eye exhibits no discharge. Left eye exhibits no discharge.  Neck: Normal range of motion. Neck supple. No tracheal deviation present. No thyromegaly present.  Cardiovascular: Normal rate and regular rhythm.  Pulmonary/Chest: Breath sounds normal. No respiratory distress. She has no wheezes. She has no rales.  Abdominal: Soft. Bowel sounds are normal. She exhibits no distension. There is no tenderness.  Musculoskeletal: Normal range of motion. She exhibits no edema or deformity.  Neurological: She is alert and oriented to person, place, and time. She has normal reflexes. No cranial nerve deficit. Coordination normal.  Skin: Skin is warm and dry. No rash noted. No erythema.  Psychiatric: Her behavior is normal.   Data Reviewed:  Latest ESR was 6 She did have an elevated ESR -as high as 50 in the past ANA is positive, anti-Ro and anti-La positive  Assessment:  Shortness of breath  Past history of smoking  History of daytime sleepiness and nonrestorative sleep Significant weight gain in the recent months  Autoimmune disease work-up is ongoing at present--may have Sjogren's  Polyarthralgia  Plan/Recommendations:  Obtain pulmonary function study-looking for obstructive and also restrictive disease Obtain chest x-ray Obtain echocardiogram To further evaluate her shortness of breath-evaluate pulmonary  pressures  Obtain a home sleep study Nonrestorative sleep, excessive daytime sleepiness, significant weight gain recently    Sherrilyn Rist MD [facility] 04/03/2018, 3:08 PM  CC: Bo Merino, MD

## 2018-04-03 NOTE — Patient Instructions (Signed)
Shortness of breath  Musculoskeletal pain and discomfort with fatigue  Daytime sleepiness and exhaustion  History of smoking   Obtain pulmonary function study  Obtain echocardiogram for shortness of breath  Obtain chest x-ray  Obtain home sleep study--fatigue, nonrestorative sleep, daytime sleepiness  We will see you back in the office in 3 months  We will update you as the studies come through

## 2018-04-07 ENCOUNTER — Telehealth: Payer: Self-pay | Admitting: Pulmonary Disease

## 2018-04-07 NOTE — Telephone Encounter (Signed)
Called and spoke with patient she was already made aware of results. Nothing further needed.

## 2018-04-08 DIAGNOSIS — Z8269 Family history of other diseases of the musculoskeletal system and connective tissue: Secondary | ICD-10-CM | POA: Insufficient documentation

## 2018-04-08 DIAGNOSIS — Z87891 Personal history of nicotine dependence: Secondary | ICD-10-CM

## 2018-04-08 DIAGNOSIS — M19041 Primary osteoarthritis, right hand: Secondary | ICD-10-CM | POA: Insufficient documentation

## 2018-04-08 DIAGNOSIS — M3501 Sicca syndrome with keratoconjunctivitis: Secondary | ICD-10-CM | POA: Insufficient documentation

## 2018-04-08 DIAGNOSIS — E785 Hyperlipidemia, unspecified: Secondary | ICD-10-CM

## 2018-04-08 DIAGNOSIS — M19042 Primary osteoarthritis, left hand: Secondary | ICD-10-CM

## 2018-04-08 HISTORY — DX: Sjogren syndrome with keratoconjunctivitis: M35.01

## 2018-04-08 HISTORY — DX: Family history of other diseases of the musculoskeletal system and connective tissue: Z82.69

## 2018-04-08 HISTORY — DX: Hyperlipidemia, unspecified: E78.5

## 2018-04-08 HISTORY — DX: Personal history of nicotine dependence: Z87.891

## 2018-04-08 HISTORY — DX: Primary osteoarthritis, right hand: M19.041

## 2018-04-08 HISTORY — DX: Primary osteoarthritis, left hand: M19.042

## 2018-04-08 NOTE — Progress Notes (Signed)
Office Visit Note  Patient: Kaitlyn Hudson             Date of Birth: [birth year]           MRN: [id number]             PCP: Enid Skeens., MD Referring: Enid Skeens., MD Visit Date: 04/22/2018 Occupation: @GUAROCC @  Subjective:  Pain in right shoulder , both knees and muscles.  History of Present Illness: Kaitlyn Hudson is a 67 y.o. female history of Sjogren's and osteoarthritis.  She states she continues to have some discomfort in her right shoulder, bilateral knee joints.  She has been also having a lot of muscle pain which moves around.  She has been having discomfort in the bilateral trapezius area and neck.  She continues to have shortness of breath.  Activities of Daily Living:  Patient reports morning stiffness for a few minutes.   Patient Reports nocturnal pain.  Difficulty dressing/grooming: Denies Difficulty climbing stairs: Reports Difficulty getting out of chair: Denies Difficulty using hands for taps, buttons, cutlery, and/or writing: Reports  Review of Systems  Constitutional: Positive for fatigue. Negative for night sweats, weight gain and weight loss.  HENT: Positive for mouth dryness. Negative for mouth sores, trouble swallowing, trouble swallowing and nose dryness.   Eyes: Positive for dryness. Negative for pain, redness and visual disturbance.  Respiratory: Negative for cough, shortness of breath, wheezing and difficulty breathing.   Cardiovascular: Negative for chest pain, palpitations, hypertension, irregular heartbeat and swelling in legs/feet.  Gastrointestinal: Negative for abdominal pain, blood in stool, constipation, diarrhea, nausea and vomiting.  Endocrine: Negative for increased urination.  Genitourinary: Negative for nocturia, pelvic pain and vaginal dryness.  Musculoskeletal: Positive for arthralgias, joint pain and morning stiffness. Negative for joint swelling, myalgias, muscle weakness, muscle tenderness and myalgias.  Skin: Negative for color  change, rash, hair loss, skin tightness, ulcers and sensitivity to sunlight.  Allergic/Immunologic: Negative for susceptible to infections.  Neurological: Negative for dizziness, light-headedness, numbness, headaches, memory loss, night sweats and weakness.  Hematological: Negative for bruising/bleeding tendency and swollen glands.  Psychiatric/Behavioral: Negative for depressed mood, confusion and sleep disturbance. The patient is not nervous/anxious.     PMFS History:  Patient Active Problem List   Diagnosis Date Noted  . Sjogren's syndrome with keratoconjunctivitis sicca ([place]) 04/08/2018  . Former smoker 04/08/2018  . Family history of systemic lupus erythematosus (SLE) in mother 04/08/2018  . Dyslipidemia 04/08/2018  . Primary osteoarthritis of both hands 04/08/2018  . History of depression 03/16/2018  . History of hyperlipidemia 03/16/2018  . Trochanteric bursitis, right hip 01/09/2018    Past Medical History:  Diagnosis Date  . Osteoarthritis     Family History  Problem Relation Age of Onset  . Lupus Mother   . Osteoarthritis Mother   . Polymyalgia rheumatica Mother   . Osteoarthritis Father    Past Surgical History:  Procedure Laterality Date  . HYSTEROTOMY    . KNEE SURGERY Right   . LYMPH NODE BIOPSY     groin    Social History   Social History Narrative  . Not on file    Objective: Vital Signs: BP 123/78 (BP Location: Left Arm, Patient Position: Sitting, Cuff Size: Normal)   Pulse 90   Resp 14   Ht 5' 2.5" (1.588 m)   Wt 189 lb 9.6 oz (86 kg)   BMI 34.13 kg/m    Physical Exam  Constitutional: She is oriented to person, place,  and time. She appears well-developed and well-nourished.  HENT:  Head: Normocephalic and atraumatic.  Eyes: Conjunctivae and EOM are normal.  Neck: Normal range of motion.  Cardiovascular: Normal rate, regular rhythm, normal heart sounds and intact distal pulses.  Pulmonary/Chest: Effort normal and breath sounds normal.    Abdominal: Soft. Bowel sounds are normal.  Lymphadenopathy:    She has no cervical adenopathy.  Neurological: She is alert and oriented to person, place, and time.  Skin: Skin is warm and dry. Capillary refill takes less than 2 seconds.  Psychiatric: She has a normal mood and affect. Her behavior is normal.  Nursing note and vitals reviewed.    Musculoskeletal Exam: C-spine thoracic lumbar spine good range of motion.  Shoulder joints elbow joints wrist joints MCPs PIPs DIPs were in good range of motion.  She has some DIP PIP thickening in her hands consistent with osteoarthritis.  Hip joints knee joints ankles MTPs PIPs were in good range of motion with no synovitis.  She had tenderness on palpation of bilateral trapezius area, bilateral lateral epicondyle, bilateral trochanteric bursa area, bilateral medial aspect of her knee joints.  She also generalized hyperalgesia.  CDAI Exam: No CDAI exam completed.   Investigation: Findings:  02/20/18: Uric acid 4.7 CBC WNL, sed rate 50, RF 36.4 CRP 1, [place] spotted fever IgG negative, CMP WNL, TSH 3.230, ANA positive, dsDNA 1, RNP 0.6, Smith negative, SCL 70-, Ro positive, La positive, anti-chromatin negative, anti-Jo 1-, anticentromere antibodies negative, parvo B19 negative, Lyme disease antibodies negative  01/14/2018 AVISE index -1.7, ANA 1: 640 homogeneous, SSA positive, SSB positive (dsDNA, Smith, RNP, SCL 70, Jo 1-), CB CAP negative, RF positive, anti-CCP negative, anti-carbamylated protein negative, anticardiolipin negative, beta-2 GP 1-, antithyroglobulin antibody negative, antithyroid peroxidase antibody negative   Imaging: Dg Chest 2 View  Result Date: 04/03/2018 CLINICAL DATA:  Dyspnea for 6 months.  Ex smoker. EXAM: CHEST - 2 VIEW FINDINGS: The heart size and mediastinal contours are within normal limits. Both lungs are clear. The visualized skeletal structures are unremarkable. IMPRESSION: No active cardiopulmonary disease.  Electronically Signed   By: Staci Righter M.D.   On: 04/03/2018 16:28    Recent Labs: Lab Results  Component Value Date   PROT 7.8 03/16/2018  UA negative, SPEP negative, immunoglobulins IgG elevated, hepatitis B-, hepatitis C negative, CK normal, TSH normal, G6PD normal, ESR 6, lupus anticoagulant negative  Speciality Comments: No specialty comments available.  Procedures:  No procedures performed Allergies: Codeine   Assessment / Plan:     Visit Diagnoses: Sjogren's syndrome with keratoconjunctivitis sicca (HCC) - +ANA, +Ro, +La, sicca symptoms, fatigue.  Patient has significant sicca symptoms.  Over-the-counter products were discussed at length.  I also discussed the option of possible Plaquenil.  Patient declined.  When she has pulmonary work-up we may consider pilocarpine.  Primary osteoarthritis of both hands-joint protection muscle strengthening was discussed.  Fibromyalgia-she continues to have generalized pain and positive tender points.  She has hyperalgesia.  She meets the criteria for fibromyalgia.  Detailed counseling was provided.  I have advised her to discuss this further with her PCP.  She may benefit from water therapy and good sleep hygiene.  A muscle relaxer could be helpful but it will cause dry mouth.  He was given a prescription for tizanidine 4 mg p.o. nightly as needed total 30 tablets with 0 refills were given.  She was made aware that the medication can cause worsening of dry mouth and dry eyes.  Shortness of breath - Former smoker, April 03, 2018 chest x-ray normal, referred to pulmonologist.  Pulmonary work-up is a still pending.  Patient states she has appointment with cardiologist as well.  Former smoker  Dyslipidemia  History of depression  Family history of systemic lupus erythematosus (SLE) in mother - AVISE index was negative.  Orders: No orders of the defined types were placed in this encounter.  Meds ordered this encounter  Medications  .  tiZANidine (ZANAFLEX) 4 MG tablet    Sig: Take 1 tablet (4 mg total) by mouth every 6 (six) hours as needed for muscle spasms.    Dispense:  30 tablet    Refill:  0    Face-to-face time spent with patient was 30 minutes. Greater than 50% of time was spent in counseling and coordination of care.  Follow-Up Instructions: Return in about 3 months (around 07/23/2018) for Sjogren's syndrome, Osteoarthritis.   Bo Merino, MD  Note - This record has been created using Editor, commissioning.  Chart creation errors have been sought, but may not always  have been located. Such creation errors do not reflect on  the standard of medical care.

## 2018-04-22 ENCOUNTER — Ambulatory Visit (INDEPENDENT_AMBULATORY_CARE_PROVIDER_SITE_OTHER): Payer: Medicare Other | Admitting: Rheumatology

## 2018-04-22 ENCOUNTER — Encounter: Payer: Self-pay | Admitting: Rheumatology

## 2018-04-22 VITALS — BP 123/78 | HR 90 | Resp 14 | Ht 62.5 in | Wt 189.6 lb

## 2018-04-22 DIAGNOSIS — Z87891 Personal history of nicotine dependence: Secondary | ICD-10-CM

## 2018-04-22 DIAGNOSIS — E785 Hyperlipidemia, unspecified: Secondary | ICD-10-CM

## 2018-04-22 DIAGNOSIS — Z8659 Personal history of other mental and behavioral disorders: Secondary | ICD-10-CM

## 2018-04-22 DIAGNOSIS — M3501 Sicca syndrome with keratoconjunctivitis: Secondary | ICD-10-CM

## 2018-04-22 DIAGNOSIS — Z8269 Family history of other diseases of the musculoskeletal system and connective tissue: Secondary | ICD-10-CM

## 2018-04-22 DIAGNOSIS — M19041 Primary osteoarthritis, right hand: Secondary | ICD-10-CM | POA: Diagnosis not present

## 2018-04-22 DIAGNOSIS — M797 Fibromyalgia: Secondary | ICD-10-CM

## 2018-04-22 DIAGNOSIS — R0602 Shortness of breath: Secondary | ICD-10-CM | POA: Diagnosis not present

## 2018-04-22 DIAGNOSIS — M19042 Primary osteoarthritis, left hand: Secondary | ICD-10-CM

## 2018-04-22 MED ORDER — TIZANIDINE HCL 4 MG PO TABS: 4.0000 mg | ORAL_TABLET | Freq: Four times a day (QID) | ORAL | 0 refills | Status: DC | PRN

## 2018-04-22 NOTE — Progress Notes (Signed)
Pharmacy Counseling Note  Patient presents today to the [facility] to see Dr. Corliss Skainseveshwar for Sjogren's and fibromyalgia.   Patient seen by the pharmacist for counseling on new medications tizanidine, magnesium, and biotene products.  Counseled patient on purpose, proper use, and adverse effects of tizanidine.  Reviewed the most common adverse effects including dizziness, drowsiness, and dry mouth.  Recommended for patient to try at night as it can help with sleep and she can see how she tolerates the medication before taking during the daytime.  Advised abstaining from alcohol since it can increase the risk of adverse effects.  Advised patient to stop taking if dry mouth becomes an issue.  Recommended Biotene products to help with the dry mouth such as the toothpaste and mouthwash.    Counseled on purpose, proper use, and adverse effects of magnesium malate.   All questions were encouraged and answered.   Verlin FesterAmber Yue Glasheen, PharmD, [facility] LPBCACP Rheumatology Clinical Pharmacist  04/22/2018 3:55 PM

## 2018-04-23 ENCOUNTER — Telehealth: Payer: Self-pay | Admitting: Rheumatology

## 2018-04-23 NOTE — Telephone Encounter (Signed)
I spoke with patient and discussed that the treatment for fibromyalgia is good night sleep, exercise and water aerobics.  She was in agreement .

## 2018-04-23 NOTE — Telephone Encounter (Signed)
Patient called stating Dr. Corliss Skainseveshwar told her she should see her PCP for her fibromyalgia.  Patient states she spoke with her PCP Dr. Egbert GaribaldiSlatosky who told her he refers Fibromyalgia to Rheumatology.  Patient is not sure who she should see and is requesting a return call.

## 2018-04-29 ENCOUNTER — Other Ambulatory Visit: Payer: Self-pay | Admitting: Obstetrics and Gynecology

## 2018-04-29 DIAGNOSIS — Z1231 Encounter for screening mammogram for malignant neoplasm of breast: Secondary | ICD-10-CM

## 2018-05-07 DIAGNOSIS — G4733 Obstructive sleep apnea (adult) (pediatric): Secondary | ICD-10-CM

## 2018-05-08 ENCOUNTER — Telehealth: Payer: Self-pay | Admitting: Pulmonary Disease

## 2018-05-08 DIAGNOSIS — G4733 Obstructive sleep apnea (adult) (pediatric): Secondary | ICD-10-CM

## 2018-05-08 NOTE — Telephone Encounter (Signed)
Dr. Wynona Neat has reviewed the home sleep test this showed moderately severe sleep apnea with moderate oxygen desaturation .   Recommendations   Treatment options are CPAP with the settings auto 5 to 15.    Weight loss measures .   Advise against driving while sleepy & against medication with sedative side effects.   Patient will need ono once on cpap therapy to ensure adequate saturation is maintained with therapy.   Make appointment for 8 to 10 weeks for compliance with download with Dr. Wynona Neat.

## 2018-05-11 ENCOUNTER — Other Ambulatory Visit: Payer: Self-pay | Admitting: *Deleted

## 2018-05-11 ENCOUNTER — Encounter: Payer: Self-pay | Admitting: Cardiology

## 2018-05-11 DIAGNOSIS — R0602 Shortness of breath: Secondary | ICD-10-CM

## 2018-05-11 DIAGNOSIS — M797 Fibromyalgia: Secondary | ICD-10-CM | POA: Insufficient documentation

## 2018-05-11 DIAGNOSIS — R0789 Other chest pain: Secondary | ICD-10-CM | POA: Insufficient documentation

## 2018-05-11 DIAGNOSIS — G4710 Hypersomnia, unspecified: Secondary | ICD-10-CM

## 2018-05-11 HISTORY — DX: Shortness of breath: R06.02

## 2018-05-11 HISTORY — DX: Other chest pain: R07.89

## 2018-05-11 NOTE — Telephone Encounter (Signed)
Patient is aware of results and orders have been placed. Follow up appointment was made also.

## 2018-05-13 ENCOUNTER — Other Ambulatory Visit: Payer: Self-pay

## 2018-05-13 ENCOUNTER — Ambulatory Visit (INDEPENDENT_AMBULATORY_CARE_PROVIDER_SITE_OTHER): Payer: Medicare Other

## 2018-05-13 DIAGNOSIS — R0602 Shortness of breath: Secondary | ICD-10-CM | POA: Diagnosis not present

## 2018-05-13 NOTE — Progress Notes (Signed)
Complete echocardiogram has been performed.  Jimmy Danisha Brassfield RDCS 

## 2018-05-14 ENCOUNTER — Ambulatory Visit
Admission: RE | Admit: 2018-05-14 | Discharge: 2018-05-14 | Disposition: A | Discharge status: Home / Self Care | Payer: Medicare Other | Source: Ambulatory Visit | Attending: Obstetrics and Gynecology | Admitting: Obstetrics and Gynecology

## 2018-05-14 DIAGNOSIS — Z1231 Encounter for screening mammogram for malignant neoplasm of breast: Secondary | ICD-10-CM

## 2018-05-14 NOTE — Progress Notes (Signed)
Cardiology Office Note:    Date:  05/15/2018   ID:  Kaitlyn FriendlyMary A Mccole, DOB 12-Sep-1950, MRN [id number]  PCP:  Nonnie DoneSlatosky, John J., MD  Cardiologist:  Norman HerrlichBrian Munley, MD   Referring MD: Nonnie DoneSlatosky, John J., MD  ASSESSMENT:    1. Angina pectoris (HCC)   2. Chest pressure   3. SOB (shortness of breath) on exertion   4. Sjogren's syndrome with keratoconjunctivitis sicca (HCC)   5. Obstructive sleep apnea syndrome   6. Mixed hyperlipidemia    PLAN:    In order of problems listed above:  1. She is having atypical angina after review of options of typical cardiac imaging nuclear or echo versus cardiac CTA we decided to pursue cardiac CTA for diagnosis of coronary artery disease.  If present and evidence of flow-limiting stenosis she would benefit from revascularization.  In the interim she will continue aspirin she has a prescription for nitroglycerin and was started on lipid-lowering therapy with pravastatin. 2. See above her symptoms are quite concerning appear to be atypical to angina further evaluation of cardiac CTA 3. Without obvious cause, she has been seen by pulmonary is pending pulmonary function test was found to have sleep apnea we will initiate CPAP for further evaluation we will ask her to have a BNP level her echocardiogram does not show evidence of heart failure or pulmonary artery hypertension.  This may well be anginal equivalent symptoms. 4. Stable managed by pulmonary note she does have an elevated sed rate and a positive ANA 5. Hyperlipidemia initiated on lipid-lowering therapy with a statin 6. Sleep apnea pending evaluation and treatment CPAP  Next appointment 2 months    Medication Adjustments/Labs and Tests Ordered: Current medicines are reviewed at length with the patient today.  Concerns regarding medicines are outlined above.  Orders Placed This Encounter  Procedures  . CT CORONARY MORPH W/CTA COR W/SCORE W/CA W/CM &/OR WO/CM  . CT CORONARY FRACTIONAL FLOW RESERVE DATA  PREP  . CT CORONARY FRACTIONAL FLOW RESERVE FLUID ANALYSIS  . Troponin I  . Pro b natriuretic peptide (BNP)  . EKG 12-Lead   Meds ordered this encounter  Medications  . aspirin EC 81 MG tablet    Sig: Take 1 tablet (81 mg total) by mouth daily.    Dispense:  90 tablet    Refill:  3     Chief Complaint  Patient presents with  . Chest Pain  . Shortness of Breath    History of Present Illness:    Kaitlyn Hudson is a 67 y.o. female who is being seen today for the evaluation of chest pain and SOB at the request of Slatosky, Excell SeltzerJohn J., MD. She was recently diagnosed with sleep apnea and is awaiting CPAP.   She has not felt well for several weeks her deterioration really began in January she was caring for her father who has since died was physically and emotionally fatigued and had the onset of chest pain she describes it as severe substernal pressure radiates up to the shoulders clavicles and into the neck her first episode was so severe she reports she is having a heart attack and she consider coming to the hospital but did not subsequent episodes have not been as severe last a few minutes and resolve with rest and have been occurring about every other day.  The chest pain is nonexertional in nature.  She also has developed progressive shortness of breath with relatively mild old activities.  She has had a little  or no cough no bronchospasm has a history of previous cigarette smoking and normal chest x-ray and was found to have sleep apnea she is been seen by pulmonary she is pending PFTs.  As a child there is a question rheumatic fever but no history of murmur or rheumatic heart disease she has not had edema orthopnea palpitation or syncope.  Unfortunately I do not have access to routine labs performed through ribs PCP but she tells me that lipids showed a very elevated triglyceride cholesterol and was started on lipid-lowering therapy.  Echo 05/13/18:  ------------------------------------------------------------------- Study Conclusions - Left ventricle: The cavity size was normal. Systolic function was   normal. The estimated ejection fraction was in the range of 55%   to 60%. Wall motion was normal; there were no regional wall   motion abnormalities. Left ventricular diastolic function   parameters were normal. Impressions: - Grossly normal study.  CXR 04/03/18 Normal Past Medical History:  Diagnosis Date  . Allergic rhinitis   . Anxiety 04/02/2016  . Chest pressure 05/11/2018  . Depression   . Dyslipidemia 04/08/2018  . Exertional shortness of breath 05/11/2018  . Family history of systemic lupus erythematosus (SLE) in mother 04/08/2018  . Fibromyalgia   . Former smoker 04/08/2018  . History of cervical dysplasia 04/02/2016   2013; hysterectomy d/t recurrent dysplasia s/p LEEP  . History of depression 03/16/2018  . History of hyperlipidemia 03/16/2018  . Hypercholesteremia 04/02/2016  . Osteoarthritis   . Primary osteoarthritis of both hands 04/08/2018  . Sjogren's syndrome with keratoconjunctivitis sicca (HCC) 04/08/2018   +ANA, +Ro, +La, sicca symptoms, fatigue  . Trochanteric bursitis, right hip 01/09/2018    Past Surgical History:  Procedure Laterality Date  . HYSTEROTOMY    . KNEE SURGERY Right   . LYMPH NODE BIOPSY     groin     Current Medications: Current Meds  Medication Sig  . ibuprofen (ADVIL) 200 MG tablet Take 800 mg by mouth every 6 (six) hours as needed.  . NON FORMULARY Take 0.25 drops by mouth daily as needed. CBD Oil Full Spectrum 1000mg   . pravastatin (PRAVACHOL) 10 MG tablet Take 10 mg by mouth daily.  . sertraline (ZOLOFT) 100 MG tablet Take 100 mg by mouth daily.      Allergies:   Codeine   Social History   Socioeconomic History  . Marital status: Married    Spouse name: Not on file  . Number of children: Not on file  . Years of education: Not on file  . Highest education level: Not on file    Occupational History  . Not on file  Social Needs  . Financial resource strain: Not on file  . Food insecurity:    Worry: Not on file    Inability: Not on file  . Transportation needs:    Medical: Not on file    Non-medical: Not on file  Tobacco Use  . Smoking status: Former Smoker    Packs/day: 0.50    Years: 30.00    Pack years: 15.00    Types: Cigarettes    Last attempt to quit: 09/02/2010    Years since quitting: 7.7  . Smokeless tobacco: Never Used  Substance and Sexual Activity  . Alcohol use: No    Frequency: Never  . Drug use: Never  . Sexual activity: Not on file  Lifestyle  . Physical activity:    Days per week: Not on file    Minutes per session: Not on file  .  Stress: Not on file  Relationships  . Social connections:    Talks on phone: Not on file    Gets together: Not on file    Attends religious service: Not on file    Active member of club or organization: Not on file    Attends meetings of clubs or organizations: Not on file    Relationship status: Not on file  Other Topics Concern  . Not on file  Social History Narrative  . Not on file     Family History: The patient's family history includes Alzheimer's disease in her father; Breast cancer in her maternal grandmother; Lupus in her mother; Osteoarthritis in her father and mother; Polymyalgia rheumatica in her mother.  ROS:   Review of Systems  Constitution: Positive for malaise/fatigue.  HENT: Negative.   Eyes: Negative.   Cardiovascular: Positive for chest pain and dyspnea on exertion.  Respiratory: Positive for shortness of breath.   Endocrine: Negative.   Hematologic/Lymphatic: Negative.   Skin: Negative.   Musculoskeletal: Positive for joint pain, muscle weakness and myalgias.  Gastrointestinal: Negative.   Genitourinary: Negative.   Neurological: Negative.   Psychiatric/Behavioral: Negative.   Allergic/Immunologic: Negative.    Please see the history of present illness.     All  other systems reviewed and are negative.  EKGs/Labs/Other Studies Reviewed:    The following studies were reviewed today: Pulmonary consultation reviewed prior to visit Echo EF 60-65% LA normal  EKG:  EKG is  ordered today.  The ekg ordered today demonstrates [facility] normal EKG 05/01/18 [facility] and normal Recent Labs:   CBC CMP are normal 03/16/2018: TSH 3.64  Recent Lipid Panel   Chol 277 HDL 38 LDL No results found for: CHOL, TRIG, HDL, CHOLHDL, VLDL, LDLCALC, LDLDIRECT  Physical Exam:    VS:  BP 114/72 (BP Location: Right Arm, Patient Position: Sitting, Cuff Size: Large)   Pulse 65   Ht 5' 2.5" (1.588 m)   Wt 191 lb (86.6 kg)   SpO2 98%   BMI 34.38 kg/m     Wt Readings from Last 3 Encounters:  05/15/18 191 lb (86.6 kg)  04/22/18 189 lb 9.6 oz (86 kg)  04/03/18 189 lb (85.7 kg)     GEN:  Well nourished, well developed in no acute distress HEENT: Normal NECK: No JVD; No carotid bruits LYMPHATICS: No lymphadenopathy CARDIAC: RRR, no murmurs, rubs, gallops RESPIRATORY:  Clear to auscultation without rales, wheezing or rhonchi  ABDOMEN: Soft, non-tender, non-distended MUSCULOSKELETAL:  No edema; No deformity  SKIN: Warm and dry NEUROLOGIC:  Alert and oriented x 3 PSYCHIATRIC:  Normal affect     Signed, Norman Herrlich, MD  05/15/2018 2:20 PM    [facility]

## 2018-05-15 ENCOUNTER — Ambulatory Visit (INDEPENDENT_AMBULATORY_CARE_PROVIDER_SITE_OTHER): Payer: Medicare Other | Admitting: Cardiology

## 2018-05-15 ENCOUNTER — Encounter: Payer: Self-pay | Admitting: Cardiology

## 2018-05-15 ENCOUNTER — Encounter: Payer: Self-pay | Admitting: *Deleted

## 2018-05-15 VITALS — BP 114/72 | HR 65 | Ht 62.5 in | Wt 191.0 lb

## 2018-05-15 DIAGNOSIS — R0789 Other chest pain: Secondary | ICD-10-CM

## 2018-05-15 DIAGNOSIS — R0602 Shortness of breath: Secondary | ICD-10-CM | POA: Diagnosis not present

## 2018-05-15 DIAGNOSIS — E782 Mixed hyperlipidemia: Secondary | ICD-10-CM

## 2018-05-15 DIAGNOSIS — I209 Angina pectoris, unspecified: Secondary | ICD-10-CM | POA: Insufficient documentation

## 2018-05-15 DIAGNOSIS — G4733 Obstructive sleep apnea (adult) (pediatric): Secondary | ICD-10-CM

## 2018-05-15 DIAGNOSIS — M3501 Sicca syndrome with keratoconjunctivitis: Secondary | ICD-10-CM | POA: Diagnosis not present

## 2018-05-15 DIAGNOSIS — G4730 Sleep apnea, unspecified: Secondary | ICD-10-CM | POA: Insufficient documentation

## 2018-05-15 HISTORY — DX: Angina pectoris, unspecified: I20.9

## 2018-05-15 MED ORDER — METOPROLOL TARTRATE 50 MG PO TABS: 50.0000 mg | ORAL_TABLET | Freq: Once | ORAL | 0 refills | Status: DC

## 2018-05-15 MED ORDER — ASPIRIN EC 81 MG PO TBEC: 81.0000 mg | DELAYED_RELEASE_TABLET | Freq: Every day | ORAL | 3 refills | Status: AC

## 2018-05-15 NOTE — Patient Instructions (Addendum)
Medication Instructions:  Your physician has recommended you make the following change in your medication:  DECREASE aspirin 81 mg daily   Labwork: Your physician recommends that you return for lab work today: STAT troponin I, ProBNP.   Testing/Procedures: You had an EKG today.   Your physician has requested that you have cardiac CT. Cardiac computed tomography (CT) is a painless test that uses an x-ray machine to take clear, detailed pictures of your heart. For further information please visit [email or web address] Please follow instruction sheet as given.   Please arrive at the [facility] main entrance of [facility] at xx:xx AM (30-45 minutes prior to test start time)  [facility] [address] [place], [place] [address] [phone]  Proceed to the [facility] Radiology Department (First Floor).  Please follow these instructions carefully (unless otherwise directed):   On the Night Before the Test: . Drink plenty of water. . Do not consume any caffeinated/decaffeinated beverages or chocolate 12 hours prior to your test. . Do not take any antihistamines 12 hours prior to your test.  On the Day of the Test: . Drink plenty of water. Do not drink any water within one hour of the test. . Do not eat any food 4 hours prior to the test. . You may take your regular medications prior to the test. . IF NOT ON A BETA BLOCKER - Take 50 mg of lopressor (metoprolol) one hour before the test.  After the Test: . Drink plenty of water. . After receiving IV contrast, you may experience a mild flushed feeling. This is normal. . On occasion, you may experience a mild rash up to 24 hours after the test. This is not dangerous. If this occurs, you can take Benadryl 25 mg and increase your fluid intake. . If you experience trouble breathing, this can be serious. If it is severe call 911 IMMEDIATELY. If it is mild, please call our office.   Follow-Up: Your physician  recommends that you schedule a follow-up appointment in: 6 weeks.  If you need a refill on your cardiac medications before your next appointment, please call your pharmacy.   Thank you for choosing [facility]! Mady Gemma, RN [phone]    Cardiac CT Angiogram A cardiac CT angiogram is a procedure to look at the heart and the area around the heart. It may be done to help find the cause of chest pains or other symptoms of heart disease. During this procedure, a large X-ray machine, called a CT scanner, takes detailed pictures of the heart and the surrounding area after a dye (contrast material) has been injected into blood vessels in the area. The procedure is also sometimes called a coronary CT angiogram, coronary artery scanning, or CTA. A cardiac CT angiogram allows the health care provider to see how well blood is flowing to and from the heart. The health care provider will be able to see if there are any problems, such as:  Blockage or narrowing of the coronary arteries in the heart.  Fluid around the heart.  Signs of weakness or disease in the muscles, valves, and tissues of the heart.  Tell a health care provider about:  Any allergies you have. This is especially important if you have had a previous allergic reaction to contrast dye.  All medicines you are taking, including vitamins, herbs, eye drops, creams, and over-the-counter medicines.  Any blood disorders you have.  Any surgeries you have had.  Any medical conditions you have.  Whether you are pregnant or may be pregnant.  Any anxiety disorders, chronic pain, or other conditions you have that may increase your stress or prevent you from lying still. What are the risks? Generally, this is a safe procedure. However, problems may occur, including:  Bleeding.  Infection.  Allergic reactions to medicines or dyes.  Damage to other structures or organs.  Kidney damage from the dye or contrast that is  used.  Increased risk of cancer from radiation exposure. This risk is low. Talk with your health care provider about: ? The risks and benefits of testing. ? How you can receive the lowest dose of radiation.  What happens before the procedure?  Wear comfortable clothing and remove any jewelry, glasses, dentures, and hearing aids.  Follow instructions from your health care provider about eating and drinking. This may include: ? For 12 hours before the test - avoid caffeine. This includes tea, coffee, soda, energy drinks, and diet pills. Drink plenty of water or other fluids that do not have caffeine in them. Being well-hydrated can prevent complications. ? For 4-6 hours before the test - stop eating and drinking. The contrast dye can cause nausea, but this is less likely if your stomach is empty.  Ask your health care provider about changing or stopping your regular medicines. This is especially important if you are taking diabetes medicines, blood thinners, or medicines to treat erectile dysfunction. What happens during the procedure?  Hair on your chest may need to be removed so that small sticky patches called electrodes can be placed on your chest. These will transmit information that helps to monitor your heart during the test.  An IV tube will be inserted into one of your veins.  You might be given a medicine to control your heart rate during the test. This will help to ensure that good images are obtained.  You will be asked to lie on an exam table. This table will slide in and out of the CT machine during the procedure.  Contrast dye will be injected into the IV tube. You might feel warm, or you may get a metallic taste in your mouth.  You will be given a medicine (nitroglycerin) to relax (dilate) the arteries in your heart.  The table that you are lying on will move into the CT machine tunnel for the scan.  The person running the machine will give you instructions while the  scans are being done. You may be asked to: ? Keep your arms above your head. ? Hold your breath. ? Stay very still, even if the table is moving.  When the scanning is complete, you will be moved out of the machine.  The IV tube will be removed. The procedure may vary among health care providers and hospitals. What happens after the procedure?  You might feel warm, or you may get a metallic taste in your mouth from the contrast dye.  You may have a headache from the nitroglycerin.  After the procedure, drink water or other fluids to wash (flush) the contrast material out of your body.  Contact a health care provider if you have any symptoms of allergy to the contrast. These symptoms include: ? Shortness of breath. ? Rash or hives. ? A racing heartbeat.  Most people can return to their normal activities right after the procedure. Ask your health care provider what activities are safe for you.  It is up to you to get the results of your procedure. Ask your  health care provider, or the department that is doing the procedure, when your results will be ready. Summary  A cardiac CT angiogram is a procedure to look at the heart and the area around the heart. It may be done to help find the cause of chest pains or other symptoms of heart disease.  During this procedure, a large X-ray machine, called a CT scanner, takes detailed pictures of the heart and the surrounding area after a dye (contrast material) has been injected into blood vessels in the area.  Ask your health care provider about changing or stopping your regular medicines before the procedure. This is especially important if you are taking diabetes medicines, blood thinners, or medicines to treat erectile dysfunction.  After the procedure, drink water or other fluids to wash (flush) the contrast material out of your body. This information is not intended to replace advice given to you by your health care provider. Make sure you  discuss any questions you have with your health care provider. Document Released: 08/01/2008 Document Revised: 07/08/2016 Document Reviewed: 07/08/2016 Elsevier Interactive Patient Education  2017 Elsevier Inc.   Cardiac-Specific Troponin I and T Test Why am I having this test? You may have this test if you have experienced chest pain. The test can be used to determine if you have had a heart attack or injury to heart (cardiac) muscle. This test can also help predict the possibility of future heart attacks. This test measures the concentration of cardiac-specific troponin in your blood. Troponins are proteins that help muscles contract. There are three forms of troponin, including troponins C, I, and T. The types of troponins I and T that are found in cardiac muscle are different from the troponins I and T that are found in skeletal muscle. Therefore, testing can be done for cardiac-specific troponins I and T. These types of troponin are normally present in very small quantities in the blood. When there is damage to heart muscle cells, cardiac troponins I and T are released into circulation. The more damage there is, the greater the concentration of troponins I and T. When a person has a heart attack, levels of troponin can become elevated in the blood within 3-4 hours after injury and may remain elevated for 10-14 days. What kind of sample is taken? A blood sample is required for this test. It is usually collected by inserting a needle into a vein. Usually, an initial blood sample is collected, and then another blood sample is collected 12 hours later. After these samples, you will have your blood tested daily for 3-5 days. You might also have it tested weekly for 5-6 weeks. How do I prepare for this test? There is no preparation required for this test. However, be aware that you will need to make arrangements to have your blood collected frequently. What are the reference ranges? Reference values  are considered healthy values established after testing a large group of healthy people. Reference values may vary among different people, labs, and hospitals. It is your responsibility to obtain your test results. Ask the lab or department performing the test when and how you will get your results. Reference values for cardiac troponins are as follows:  Cardiac troponin T: less than 0.1 ng/mL.  Cardiac troponin I: less than 0.03 ng/mL.  What do the results mean? Troponin values above the reference values may indicate:  Injury to the heart muscle.  Heart attack.  Talk with your health care provider to discuss your results, treatment  options, and if necessary, the need for more tests. Talk with your health care provider if you have any questions about your results. Talk with your health care provider to discuss your results, treatment options, and if necessary, the need for more tests. Talk with your health care provider if you have any questions about your results. This information is not intended to replace advice given to you by your health care provider. Make sure you discuss any questions you have with your health care provider. Document Released: 09/21/2004 Document Revised: 04/23/2016 Document Reviewed: 01/12/2014 Elsevier Interactive Patient Education  [employer]2018 [employer] Inc.

## 2018-05-16 LAB — PRO B NATRIURETIC PEPTIDE: NT-PRO BNP: 64 pg/mL (ref 0–301)

## 2018-05-16 LAB — TROPONIN I: Troponin I: <0.01 ng/mL (ref 0.00–0.04)

## 2018-05-28 ENCOUNTER — Other Ambulatory Visit: Payer: Self-pay | Admitting: *Deleted

## 2018-05-28 ENCOUNTER — Telehealth: Payer: Self-pay | Admitting: *Deleted

## 2018-05-28 DIAGNOSIS — I209 Angina pectoris, unspecified: Secondary | ICD-10-CM

## 2018-05-28 DIAGNOSIS — Z01818 Encounter for other preprocedural examination: Secondary | ICD-10-CM

## 2018-05-28 NOTE — Telephone Encounter (Signed)
Patient informed that she needs to have lab work drawn 3-7 days before cardiac CT scheduled on 06/11/18 at 1:30 pm. Patient will return to the [place] office on 06/05/18 to have a BMP drawn. No further questions.

## 2018-06-05 LAB — BASIC METABOLIC PANEL
BUN/Creatinine Ratio: 21 (ref 12–28)
BUN: 15 mg/dL (ref 8–27)
CHLORIDE: 103 mmol/L (ref 96–106)
CO2: 21 mmol/L (ref 20–29)
Calcium: 9.3 mg/dL (ref 8.7–10.3)
Creatinine, Ser: 0.72 mg/dL (ref 0.57–1.00)
GFR calc non Af Amer: 88 mL/min/{1.73_m2} (ref >59)
GFR, EST AFRICAN AMERICAN: 102 mL/min/{1.73_m2} (ref >59)
GLUCOSE: 88 mg/dL (ref 65–99)
Potassium: 4 mmol/L (ref 3.5–5.2)
Sodium: 138 mmol/L (ref 134–144)

## 2018-06-11 ENCOUNTER — Encounter: Payer: Medicare Other | Admitting: *Deleted

## 2018-06-11 ENCOUNTER — Ambulatory Visit (HOSPITAL_COMMUNITY)
Admission: RE | Admit: 2018-06-11 | Discharge: 2018-06-11 | Disposition: A | Discharge status: Home / Self Care | Payer: Medicare Other | Source: Ambulatory Visit | Attending: Cardiology | Admitting: Cardiology

## 2018-06-11 ENCOUNTER — Encounter (HOSPITAL_COMMUNITY): Payer: Self-pay

## 2018-06-11 ENCOUNTER — Ambulatory Visit (HOSPITAL_COMMUNITY): Payer: Medicare Other

## 2018-06-11 DIAGNOSIS — I209 Angina pectoris, unspecified: Secondary | ICD-10-CM | POA: Diagnosis present

## 2018-06-11 DIAGNOSIS — I2510 Atherosclerotic heart disease of native coronary artery without angina pectoris: Secondary | ICD-10-CM | POA: Insufficient documentation

## 2018-06-11 DIAGNOSIS — Z006 Encounter for examination for normal comparison and control in clinical research program: Secondary | ICD-10-CM

## 2018-06-11 MED ORDER — NITROGLYCERIN 0.4 MG SL SUBL
SUBLINGUAL_TABLET | SUBLINGUAL | Status: AC
  Filled 2018-06-11: qty 2

## 2018-06-11 MED ORDER — METOPROLOL TARTRATE 5 MG/5ML IV SOLN
INTRAVENOUS | Status: AC
  Filled 2018-06-11: qty 20

## 2018-06-11 MED ORDER — METOPROLOL TARTRATE 5 MG/5ML IV SOLN
10.0000 mg | INTRAVENOUS | Status: DC | PRN
  Administered 2018-06-11: 5 mg via INTRAVENOUS
  Filled 2018-06-11 (×2): qty 10

## 2018-06-11 MED ORDER — NITROGLYCERIN 0.4 MG SL SUBL
0.8000 mg | SUBLINGUAL_TABLET | Freq: Once | SUBLINGUAL | Status: AC
  Administered 2018-06-11: 0.8 mg via SUBLINGUAL
  Filled 2018-06-11: qty 25

## 2018-06-11 MED ORDER — IOPAMIDOL (ISOVUE-370) INJECTION 76%
100.0000 mL | Freq: Once | INTRAVENOUS | Status: AC | PRN
  Administered 2018-06-11: 80 mL via INTRAVENOUS

## 2018-06-11 NOTE — Research (Signed)
Kaitlyn Hudson met criteria for the CADFEM trial. The trial was explained to her including the risk/benefits. Kaitlyn Hudson had the opportunity to read the consent and ask questions. The consent was signed and a copy was given to the patient. No study related procedures were performed prior to the consent being signed at 12:35 pm. 

## 2018-06-11 NOTE — Progress Notes (Signed)
CT complete. Patient voices no complaints. Pt having snack and drink at this time.

## 2018-06-11 NOTE — Progress Notes (Signed)
Patient ambulatory out of department with steady gait. Denies any complaints.  

## 2018-06-15 ENCOUNTER — Ambulatory Visit (HOSPITAL_COMMUNITY)
Admission: RE | Admit: 2018-06-15 | Discharge: 2018-06-15 | Disposition: A | Discharge status: Home / Self Care | Payer: Medicare Other | Source: Ambulatory Visit | Attending: Cardiology | Admitting: Cardiology

## 2018-06-15 DIAGNOSIS — I209 Angina pectoris, unspecified: Secondary | ICD-10-CM | POA: Diagnosis present

## 2018-06-17 ENCOUNTER — Telehealth: Payer: Self-pay | Admitting: *Deleted

## 2018-06-17 NOTE — Telephone Encounter (Signed)
Patient informed of cardiac CTA results and advised to continue current medications. Dr. Dulce Sellar recommends cardiac rehab and patient is interested in this option but wants to discuss in detail during follow up appointment on Friday, 06/26/18 at 3:40 pm. No further questions. Dr. Dulce Sellar made aware.

## 2018-06-26 ENCOUNTER — Encounter: Payer: Self-pay | Admitting: Cardiology

## 2018-06-26 ENCOUNTER — Ambulatory Visit (INDEPENDENT_AMBULATORY_CARE_PROVIDER_SITE_OTHER): Payer: Medicare Other | Admitting: Cardiology

## 2018-06-26 DIAGNOSIS — I2510 Atherosclerotic heart disease of native coronary artery without angina pectoris: Secondary | ICD-10-CM | POA: Insufficient documentation

## 2018-06-26 HISTORY — DX: Atherosclerotic heart disease of native coronary artery without angina pectoris: I25.10

## 2018-06-26 MED ORDER — DILTIAZEM HCL ER COATED BEADS 240 MG PO CP24: 240.0000 mg | ORAL_CAPSULE | Freq: Every day | ORAL | 3 refills | Status: DC

## 2018-06-26 NOTE — Progress Notes (Signed)
Cardiology Office Note:    Date:  06/26/2018   ID:  Kaitlyn Hudson, DOB 1950-10-21, MRN [id number]  PCP:  Kaitlyn Hudson., MD  Cardiologist:  Norman Herrlich, MD    Referring MD: Kaitlyn Hudson., MD    ASSESSMENT:    1. CAD in native artery    PLAN:    In order of problems listed above:  [name] seen in the office after cardiac CTA.  She has mild to moderate CAD but the LAD stenosis looks to be flow-limiting I thought she was taking a beta-blocker but her medication list was for a single tablet before cardiac CTA she continues to have exercise intolerance exertional dyspnea but is had no further chest pain this is anginal equivalent syndrome.  We reviewed the option of referring for coronary angioplasty I think in this case we bed best to embrace medical therapy she will start a rate limiting calcium channel blocker and I will see back in the office in 1 month if she remains symptomatic and is pleased with the quality of her life and is having significant angina and anginal equivalent dyspnea she will be appropriate for cardiac intervention.  She has not needed nitroglycerin.  In the interim she has had a sleep study and has mild sleep apnea.  I sent in the office we looked at the report and I reviewed her findings with her she is comfortable with her diagnosis and treatment plan.  Her medical regimen now includes aspirin rate limiting calcium channel blocker and she will continue her statin   Next appointment: 4 weeks   Medication Adjustments/Labs and Tests Ordered: Current medicines are reviewed at length with the patient today.  Concerns regarding medicines are outlined above.  No orders of the defined types were placed in this encounter.  No orders of the defined types were placed in this encounter.   Chief Complaint  Patient presents with  . Follow-up    History of Present Illness:    Kaitlyn Hudson is a 67 y.o. female with a hx of angina last seen 05/15/18. She had a CCTA with  LAD stenosis, distal LAD FFR was abnormal..  Left Anterior Descending Coronary Artery: Mild ostial LAD mixed atherosclerosis (25-49% stenosis), mild proximal LAD noncalcified atherosclerosis (25-49% stenosis) just proximal to the takeoff of the first septal perforator. There is a moderate mixed atherosclerotic lesion in the mid LAD (50-69 % stenosis). Mild scattered mixed atherosclerosis in the distal LAD (25-49% stenosis). Patent diagonal branches. FFR: LAD: Ostial LAD FFR =0.92, proximal LAD FFR = 0.92, mid LAD FFR = 0.84. There is distal LAD disease with FFR = 0.7  Compliance with diet, lifestyle and medications: yes Past Medical History:  Diagnosis Date  . Allergic rhinitis   . Anxiety 04/02/2016  . Chest pressure 05/11/2018  . Depression   . Dyslipidemia 04/08/2018  . Exertional shortness of breath 05/11/2018  . Family history of systemic lupus erythematosus (SLE) in mother 04/08/2018  . Fibromyalgia   . Former smoker 04/08/2018  . History of cervical dysplasia 04/02/2016   2013; hysterectomy d/t recurrent dysplasia s/p LEEP  . History of depression 03/16/2018  . History of hyperlipidemia 03/16/2018  . Hypercholesteremia 04/02/2016  . Osteoarthritis   . Primary osteoarthritis of both hands 04/08/2018  . Sjogren's syndrome with keratoconjunctivitis sicca (HCC) 04/08/2018   +ANA, +Ro, +La, sicca symptoms, fatigue  . Trochanteric bursitis, right hip 01/09/2018    Past Surgical History:  Procedure Laterality Date  . HYSTEROTOMY    .  KNEE SURGERY Right   . LYMPH NODE BIOPSY     groin     Current Medications: Current Meds  Medication Sig  . aspirin EC 81 MG tablet Take 1 tablet (81 mg total) by mouth daily.  Marland Kitchen ibuprofen (ADVIL) 200 MG tablet Take 800 mg by mouth every 6 (six) hours as needed.  . NON FORMULARY Take 0.25 drops by mouth daily as needed. CBD Oil Full Spectrum 1000mg   . pravastatin (PRAVACHOL) 10 MG tablet Take 10 mg by mouth daily.  . sertraline (ZOLOFT) 100 MG tablet Take 100 mg by  mouth daily.      Allergies:   Codeine   Social History   Socioeconomic History  . Marital status: Married    Spouse name: Not on file  . Number of children: Not on file  . Years of education: Not on file  . Highest education level: Not on file  Occupational History  . Not on file  Social Needs  . Financial resource strain: Not on file  . Food insecurity:    Worry: Not on file    Inability: Not on file  . Transportation needs:    Medical: Not on file    Non-medical: Not on file  Tobacco Use  . Smoking status: Former Smoker    Packs/day: 0.50    Years: 30.00    Pack years: 15.00    Types: Cigarettes    Last attempt to quit: 09/02/2010    Years since quitting: 7.8  . Smokeless tobacco: Never Used  Substance and Sexual Activity  . Alcohol use: No    Frequency: Never  . Drug use: Never  . Sexual activity: Not on file  Lifestyle  . Physical activity:    Days per week: Not on file    Minutes per session: Not on file  . Stress: Not on file  Relationships  . Social connections:    Talks on phone: Not on file    Gets together: Not on file    Attends religious service: Not on file    Active member of club or organization: Not on file    Attends meetings of clubs or organizations: Not on file    Relationship status: Not on file  Other Topics Concern  . Not on file  Social History Narrative  . Not on file     Family History: The patient's family history includes Alzheimer's disease in her father; Breast cancer in her maternal grandmother; Lupus in her mother; Osteoarthritis in her father and mother; Polymyalgia rheumatica in her mother. ROS:   Please see the history of present illness.    All other systems reviewed and are negative.  EKGs/Labs/Other Studies Reviewed:    The following studies were reviewed today:  Recent Labs: 03/16/2018: TSH 3.64 05/15/2018: NT-Pro BNP 64 06/05/2018: BUN 15; Creatinine, Ser 0.72; Potassium 4.0; Sodium 138  Recent Lipid Panel No  results found for: CHOL, TRIG, HDL, CHOLHDL, VLDL, LDLCALC, LDLDIRECT  Physical Exam:    VS:  BP 128/64 (BP Location: Left Arm, Patient Position: Sitting, Cuff Size: Normal)   Pulse 74   Ht 5' 2.5" (1.588 m)   Wt 192 lb (87.1 kg)   SpO2 96%   BMI 34.56 kg/m     Wt Readings from Last 3 Encounters:  06/26/18 192 lb (87.1 kg)  05/15/18 191 lb (86.6 kg)  04/22/18 189 lb 9.6 oz (86 kg)     GEN:  Well nourished, well developed in no acute distress HEENT:  Normal NECK: No JVD; No carotid bruits LYMPHATICS: No lymphadenopathy CARDIAC: RRR, no murmurs, rubs, gallops RESPIRATORY:  Clear to auscultation without rales, wheezing or rhonchi  ABDOMEN: Soft, non-tender, non-distended MUSCULOSKELETAL:  No edema; No deformity  SKIN: Warm and dry NEUROLOGIC:  Alert and oriented x 3 PSYCHIATRIC:  Normal affect    Signed, Norman Herrlich, MD  06/26/2018 3:55 PM    [facility]

## 2018-06-26 NOTE — Patient Instructions (Addendum)
Medication Instructions:  Your physician has recommended you make the following change in your medication:   START diltiazem (cardizem CD) 240 mg: Take 1 capsule daily   If you need a refill on your cardiac medications before your next appointment, please call your pharmacy.   Lab work: None  If you have labs (blood work) drawn today and your tests are completely normal, you will receive your results only by: Marland Kitchen MyChart Message (if you have MyChart) OR . A paper copy in the mail If you have any lab test that is abnormal or we need to change your treatment, we will call you to review the results.  Testing/Procedures: None  Follow-Up: At [facility], you and your health needs are our priority.  As part of our continuing mission to provide you with exceptional heart care, we have created designated Provider Care Teams.  These Care Teams include your primary Cardiologist (physician) and Advanced Practice Providers (APPs -  Physician Assistants and Nurse Practitioners) who all work together to provide you with the care you need, when you need it. You will need a follow up appointment in 1 months.         Angina Pectoris Angina pectoris is a very bad feeling in the chest, neck, or arm. Your doctor may call it angina. There are four types of angina. Angina is caused by a lack of blood in the middle and thickest layer of the heart wall (myocardium). Angina may feel like a crushing or squeezing pain in the chest. It may feel like tightness or heavy pressure in the chest. Some people say it feels like gas, heartburn, or indigestion. Some people have symptoms other than pain. These include:  Shortness of breath.  Cold sweats.  Feeling sick to your stomach (nausea).  Feeling light-headed.  Many women have chest discomfort and some of the other symptoms. However, women often have different symptoms, such as:  Feeling tired (fatigue).  Feeling nervous for no reason.  Feeling weak for  no reason.  Dizziness or fainting.  Women may have angina without any symptoms. Follow these instructions at home:  Take medicines only as told by your doctor.  Take care of other health issues as told by your doctor. These include: ? High blood pressure (hypertension). ? Diabetes.  Follow a heart-healthy diet. Your doctor can help you to choose healthy food options and make changes.  Talk to your doctor to learn more about healthy cooking methods and use them. These include: ? Roasting. ? Grilling. ? Broiling. ? Baking. ? Poaching. ? Steaming. ? Stir-frying.  Follow an exercise program approved by your doctor.  Keep a healthy weight. Lose weight as told by your doctor.  Rest when you are tired.  Learn to manage stress.  Do not use any tobacco, such as cigarettes, chewing tobacco, or electronic cigarettes. If you need help quitting, ask your doctor.  If you drink alcohol, and your doctor says it is okay, limit yourself to no more than 1 drink per day. One drink equals 12 ounces of beer, 5 ounces of wine, or 1 ounces of hard liquor.  Stop illegal drug use.  Keep all follow-up visits as told by your doctor. This is important. Do not take these medicines unless your doctor says that you can:  Nonsteroidal anti-inflammatory drugs (NSAIDs). These include: ? Ibuprofen. ? Naproxen. ? Celecoxib.  Vitamin supplements that have vitamin A, vitamin E, or both.  Hormone therapy that contains estrogen with or without progestin.  Get help right away if:  You have pain in your chest, neck, arm, jaw, stomach, or back that: ? Lasts more than a few minutes. ? Comes back. ? Does not get better after you take medicine under your tongue (sublingual nitroglycerin).  You have any of these symptoms for no reason: ? Gas, heartburn, or indigestion. ? Sweating a lot. ? Shortness of breath or trouble breathing. ? Feeling sick to your stomach or throwing up. ? Feeling more tired than  usual. ? Feeling nervous or worrying more than usual. ? Feeling weak. ? Diarrhea.  You are suddenly dizzy or light-headed.  You faint or pass out. These symptoms may be an emergency. Do not wait to see if the symptoms will go away. Get medical help right away. Call your local emergency services (911 in the [place] Do not drive yourself to the hospital. This information is not intended to replace advice given to you by your health care provider. Make sure you discuss any questions you have with your health care provider. Document Released: 02/05/2008 Document Revised: 01/25/2016 Document Reviewed: 12/21/2013 Elsevier Interactive Patient Education  2017 Elsevier Inc.    Diltiazem extended-release capsules or tablets What is this medicine? DILTIAZEM (dil TYE a zem) is a calcium-channel blocker. It affects the amount of calcium found in your heart and muscle cells. This relaxes your blood vessels, which can reduce the amount of work the heart has to do. This medicine is used to treat high blood pressure and chest pain caused by angina. This medicine may be used for other purposes; ask your health care provider or pharmacist if you have questions. COMMON BRAND NAME(S): Cardizem CD, Cardizem LA, Cardizem SR, Cartia XT, Dilacor XR, Dilt-CD, Diltia XT, Diltzac, Matzim LA, Verna Czech, Tiamate, Tiazac What should I tell my health care provider before I take this medicine? They need to know if you have any of these conditions: -heart problems, low blood pressure, irregular heartbeat -liver disease -previous heart attack -an unusual or allergic reaction to diltiazem, other medicines, foods, dyes, or preservatives -pregnant or trying to get pregnant -breast-feeding How should I use this medicine? Take this medicine by mouth with a glass of water. Follow the directions on the prescription label. Swallow whole, do not crush or chew. Ask your doctor or pharmacist if your should take this medicine with  food. Take your doses at regular intervals. Do not take your medicine more often then directed. Do not stop taking except on the advice of your doctor or health care professional. Ask your doctor or health care professional how to gradually reduce the dose. Talk to your pediatrician regarding the use of this medicine in children. Special care may be needed. Overdosage: If you think you have taken too much of this medicine contact a poison control center or emergency room at once. NOTE: This medicine is only for you. Do not share this medicine with others. What if I miss a dose? If you miss a dose, take it as soon as you can. If it is almost time for your next dose, take only that dose. Do not take double or extra doses. What may interact with this medicine? Do not take this medicine with any of the following medications: -cisapride -hawthorn -pimozide -ranolazine -red yeast rice This medicine may also interact with the following medications: -buspirone -carbamazepine -cimetidine -cyclosporine -digoxin -local anesthetics or general anesthetics -lovastatin -medicines for anxiety or difficulty sleeping like midazolam and triazolam -medicines for high blood pressure or heart problems -quinidine -  rifampin, rifabutin, or rifapentine This list may not describe all possible interactions. Give your health care provider a list of all the medicines, herbs, non-prescription drugs, or dietary supplements you use. Also tell them if you smoke, drink alcohol, or use illegal drugs. Some items may interact with your medicine. What should I watch for while using this medicine? Check your blood pressure and pulse rate regularly. Ask your doctor or health care professional what your blood pressure and pulse rate should be and when you should contact him or her. You may feel dizzy or lightheaded. Do not drive, use machinery, or do anything that needs mental alertness until you know how this medicine affects  you. To reduce the risk of dizzy or fainting spells, do not sit or stand up quickly, especially if you are an older patient. Alcohol can make you more dizzy or increase flushing and rapid heartbeats. Avoid alcoholic drinks. What side effects may I notice from receiving this medicine? Side effects that you should report to your doctor or health care professional as soon as possible: -allergic reactions like skin rash, itching or hives, swelling of the face, lips, or tongue -confusion, mental depression -feeling faint or lightheaded, falls -redness, blistering, peeling or loosening of the skin, including inside the mouth -slow, irregular heartbeat -swelling of the feet and ankles -unusual bleeding or bruising, pinpoint red spots on the skin Side effects that usually do not require medical attention (report to your doctor or health care professional if they continue or are bothersome): -constipation or diarrhea -difficulty sleeping -facial flushing -headache -nausea, vomiting -sexual dysfunction -weak or tired This list may not describe all possible side effects. Call your doctor for medical advice about side effects. You may report side effects to FDA at 1-800-FDA-1088. Where should I keep my medicine? Keep out of the reach of children. Store at room temperature between 15 and 30 degrees C (59 and 86 degrees F). Protect from humidity. Throw away any unused medicine after the expiration date. NOTE: This sheet is a summary. It may not cover all possible information. If you have questions about this medicine, talk to your doctor, pharmacist, or health care provider.  2018 Elsevier/Gold Standard (2007-12-10 14:35:47)

## 2018-06-30 ENCOUNTER — Ambulatory Visit: Payer: Medicare Other | Admitting: Pulmonary Disease

## 2018-07-01 ENCOUNTER — Encounter: Payer: Self-pay | Admitting: Pulmonary Disease

## 2018-07-01 ENCOUNTER — Ambulatory Visit (INDEPENDENT_AMBULATORY_CARE_PROVIDER_SITE_OTHER): Payer: Medicare Other | Admitting: Pulmonary Disease

## 2018-07-01 VITALS — BP 110/62 | HR 59 | Ht 62.5 in | Wt 190.6 lb

## 2018-07-01 DIAGNOSIS — R0602 Shortness of breath: Secondary | ICD-10-CM | POA: Diagnosis not present

## 2018-07-01 NOTE — Patient Instructions (Addendum)
Contiune CPAP uses.  PFT in 3months with follow up with Dr.Olalere.

## 2018-07-01 NOTE — Progress Notes (Signed)
Kaitlyn Hudson    [id number]    [birth year]  Primary Care Physician:Slatosky, Marshall Cork., MD  Referring Physician: Enid Skeens., MD [address], [place] [address]  Chief complaint:  Patient was evaluated for obstructive lung disease, history of shortness of breath at rest and with activity Progressive symptoms with shortness of breath with mild activity Diagnosed with moderate obstructive sleep apnea for which she is on CPAP at present  HPI:  Diagnosed with moderate obstructive sleep apnea, has been using CPAP, compliant with use She feels she is not sleeping too well at night Multiple comorbidities contributing to fatigue during the day including fibromyalgia She does have some shortness of breath with activity  Been evaluated for possible autoimmune disorder History of lupus in her mother She does have joint pains, arthralgias, myalgias, sensitivity to sunlight  1 block of walking has become challenging for her  She has become less active and has gained about 30 pounds  Nonrestorative sleep, waking up feeling exhausted, excessive daytime sleepiness  She denies any significant central chest pain or pleuritic chest pains  She does have a history of depression, trochanteric bursitis  pet: Has a dog Occupation: No pertinent occupational history Exposures: No skin exposure history Smoking history: She did smoke in the past quit in 2012 Travel history: No recent travel Relevant family history: Mother had lupus  Outpatient Encounter Medications as of 07/01/2018  Medication Sig  . aspirin EC 81 MG tablet Take 1 tablet (81 mg total) by mouth daily.  Marland Kitchen diltiazem (CARDIZEM CD) 240 MG 24 hr capsule Take 1 capsule (240 mg total) by mouth daily.  Marland Kitchen ibuprofen (ADVIL) 200 MG tablet Take 800 mg by mouth every 6 (six) hours as needed.  . NON FORMULARY Take 0.25 drops by mouth daily as needed. CBD Oil Full Spectrum 1033m  . pravastatin (PRAVACHOL) 10 MG tablet Take  10 mg by mouth daily.  . sertraline (ZOLOFT) 100 MG tablet Take 100 mg by mouth daily.    No facility-administered encounter medications on file as of 07/01/2018.     Allergies as of 07/01/2018 - Review Complete 07/01/2018  Allergen Reaction Noted  . Codeine Nausea And Vomiting 09/29/2017    Past Medical History:  Diagnosis Date  . Allergic rhinitis   . Anxiety 04/02/2016  . Chest pressure 05/11/2018  . Depression   . Dyslipidemia 04/08/2018  . Exertional shortness of breath 05/11/2018  . Family history of systemic lupus erythematosus (SLE) in mother 04/08/2018  . Fibromyalgia   . Former smoker 04/08/2018  . History of cervical dysplasia 04/02/2016   2013; hysterectomy d/t recurrent dysplasia s/p LEEP  . History of depression 03/16/2018  . History of hyperlipidemia 03/16/2018  . Hypercholesteremia 04/02/2016  . Osteoarthritis   . Primary osteoarthritis of both hands 04/08/2018  . Sjogren's syndrome with keratoconjunctivitis sicca ([facility] 04/08/2018   +ANA, +Ro, +La, sicca symptoms, fatigue  . Trochanteric bursitis, right hip 01/09/2018    Past Surgical History:  Procedure Laterality Date  . HYSTEROTOMY    . KNEE SURGERY Right   . LYMPH NODE BIOPSY     groin     Family History  Problem Relation Age of Onset  . Lupus Mother   . Osteoarthritis Mother   . Polymyalgia rheumatica Mother   . Osteoarthritis Father   . Alzheimer's disease Father   . Breast cancer Maternal Grandmother     Social History   Socioeconomic History  . Marital  status: Married    Spouse name: Not on file  . Number of children: Not on file  . Years of education: Not on file  . Highest education level: Not on file  Occupational History  . Not on file  Social Needs  . Financial resource strain: Not on file  . Food insecurity:    Worry: Not on file    Inability: Not on file  . Transportation needs:    Medical: Not on file    Non-medical: Not on file  Tobacco Use  . Smoking status: Former Smoker     Packs/day: 0.50    Years: 30.00    Pack years: 15.00    Types: Cigarettes    Last attempt to quit: 09/02/2010    Years since quitting: 7.8  . Smokeless tobacco: Never Used  Substance and Sexual Activity  . Alcohol use: No    Frequency: Never  . Drug use: Never  . Sexual activity: Not on file  Lifestyle  . Physical activity:    Days per week: Not on file    Minutes per session: Not on file  . Stress: Not on file  Relationships  . Social connections:    Talks on phone: Not on file    Gets together: Not on file    Attends religious service: Not on file    Active member of club or organization: Not on file    Attends meetings of clubs or organizations: Not on file    Relationship status: Not on file  . Intimate partner violence:    Fear of current or ex partner: Not on file    Emotionally abused: Not on file    Physically abused: Not on file    Forced sexual activity: Not on file  Other Topics Concern  . Not on file  Social History Narrative  . Not on file    Review of Systems  Constitutional: Positive for activity change and fatigue. Negative for fever.  HENT: Negative.   Eyes: Negative.   Respiratory: Positive for shortness of breath.   Cardiovascular: Negative for chest pain.  Gastrointestinal: Negative.   Endocrine: Negative.   Genitourinary: Negative.   Musculoskeletal: Positive for arthralgias and myalgias.  Skin: Negative.     Vitals:   07/01/18 0944  BP: 110/62  Pulse: (!) 59  SpO2: 96%     Physical Exam  Constitutional: She appears well-developed and well-nourished.  HENT:  Head: Normocephalic and atraumatic.  Eyes: Pupils are equal, round, and reactive to light. Conjunctivae are normal. Right eye exhibits no discharge. Left eye exhibits no discharge.  Neck: Normal range of motion. Neck supple. No tracheal deviation present. No thyromegaly present.  Cardiovascular: Normal rate and regular rhythm.  Pulmonary/Chest: Breath sounds normal. No respiratory  distress. She has no wheezes. She has no rales.  Skin: Skin is warm.  Psychiatric: Her behavior is normal.   Data Reviewed:  Latest ESR was 6 She did have an elevated ESR -as high as 50 in the past ANA is positive, anti-Ro and anti-La positive Compliance data reveals 90% compliance with a residual AHI of 2.9  Assessment:  Shortness of breath -This is still persistent .  Trying to exercise on a regular basis-water aerobics  Past history of smoking -Has no significant associated diagnoses  History of daytime sleepiness and nonrestorative sleep Significant weight gain in the recent months -Diagnosed with moderate obstructive sleep apnea for which she is on CPAP at present  Autoimmune disease work-up is ongoing  at present--may have Sjogren's  Polyarthralgia  Plan/Recommendations: Encouraged to continue CPAP use  Use of a mild hypnotic discussed-she is not inclined to start anything at present  Will obtain a pulmonary function study  Encouraged to continue to exercise on a regular basis as physical activity will also help nighttime sleep  We will see her back in 3 months    Sherrilyn Rist MD [facility] 07/01/2018, 10:12 AM  CC: Enid Skeens., MD

## 2018-07-10 NOTE — Progress Notes (Signed)
Office Visit Note  Patient: Kaitlyn Hudson             Date of Birth: 1951-04-14           MRN: [id number]             PCP: Nonnie Done., MD Referring: Nonnie Done., MD Visit Date: 07/22/2018 Occupation: @GUAROCC @  Subjective:  Pain in both hands and hips.   History of Present Illness: Kaitlyn Hudson is a 67 y.o. female with history of Sjogren's osteoarthritis and fibromyalgia syndrome.  She continues to have sicca symptoms.  She states she has discomfort in her bilateral hands and bilateral hips which she describes over the trochanteric area.  She is not having much discomfort from fibromyalgia.  She continues to have shortness of breath.  She states she was seen by pulmonologist and has PFTs pending.  She also has an appointment with the cardiologist.  Activities of Daily Living:  Patient reports morning stiffness for 30-45 minutes.   Patient Reports nocturnal pain.  Difficulty dressing/grooming: Denies Difficulty climbing stairs: Denies Difficulty getting out of chair: Denies Difficulty using hands for taps, buttons, cutlery, and/or writing: Reports  Review of Systems  Constitutional: Positive for fatigue. Negative for night sweats, weight gain and weight loss.  HENT: Positive for mouth dryness. Negative for mouth sores, trouble swallowing, trouble swallowing and nose dryness.   Eyes: Positive for dryness. Negative for pain, redness, itching and visual disturbance.  Respiratory: Negative for cough, shortness of breath, wheezing and difficulty breathing.   Cardiovascular: Negative for chest pain, palpitations, hypertension, irregular heartbeat and swelling in legs/feet.       Followed by cardiology   Gastrointestinal: Negative for abdominal pain, blood in stool, constipation, diarrhea, nausea and vomiting.  Endocrine: Negative for increased urination.  Genitourinary: Negative for painful urination, nocturia, pelvic pain and vaginal dryness.  Musculoskeletal: Positive for  arthralgias, joint pain and morning stiffness. Negative for joint swelling, myalgias, muscle weakness, muscle tenderness and myalgias.  Skin: Negative for color change, rash, hair loss, skin tightness, ulcers and sensitivity to sunlight.  Allergic/Immunologic: Negative for susceptible to infections.  Neurological: Negative for dizziness, light-headedness, headaches, memory loss, night sweats and weakness.  Hematological: Negative for bruising/bleeding tendency and swollen glands.  Psychiatric/Behavioral: Positive for sleep disturbance. Negative for depressed mood and confusion. The patient is not nervous/anxious.     PMFS History:  Patient Active Problem List   Diagnosis Date Noted  . CAD in native artery 06/26/2018  . Sleep apnea 05/15/2018  . Angina pectoris (HCC) 05/15/2018  . SOB (shortness of breath) on exertion 05/11/2018  . Chest pressure 05/11/2018  . Fibromyalgia 05/11/2018  . Sjogren's syndrome with keratoconjunctivitis sicca (HCC) 04/08/2018  . Former smoker 04/08/2018  . Family history of systemic lupus erythematosus (SLE) in mother 04/08/2018  . Dyslipidemia 04/08/2018  . Primary osteoarthritis of both hands 04/08/2018  . History of depression 03/16/2018  . History of hyperlipidemia 03/16/2018  . Anxiety 04/02/2016  . History of cervical dysplasia 04/02/2016  . Hyperlipidemia 04/02/2016    Past Medical History:  Diagnosis Date  . Allergic rhinitis   . Anxiety 04/02/2016  . Chest pressure 05/11/2018  . Depression   . Dyslipidemia 04/08/2018  . Exertional shortness of breath 05/11/2018  . Family history of systemic lupus erythematosus (SLE) in mother 04/08/2018  . Fibromyalgia   . Former smoker 04/08/2018  . History of cervical dysplasia 04/02/2016   2013; hysterectomy d/t recurrent dysplasia s/p LEEP  . History  of depression 03/16/2018  . History of hyperlipidemia 03/16/2018  . Hypercholesteremia 04/02/2016  . Osteoarthritis   . Primary osteoarthritis of both hands 04/08/2018  .  Sjogren's syndrome with keratoconjunctivitis sicca (HCC) 04/08/2018   +ANA, +Ro, +La, sicca symptoms, fatigue  . Trochanteric bursitis, right hip 01/09/2018    Family History  Problem Relation Age of Onset  . Lupus Mother   . Osteoarthritis Mother   . Polymyalgia rheumatica Mother   . Osteoarthritis Father   . Alzheimer's disease Father   . Breast cancer Maternal Grandmother    Past Surgical History:  Procedure Laterality Date  . HYSTEROTOMY    . KNEE SURGERY Right   . LYMPH NODE BIOPSY     groin    Social History   Social History Narrative  . Not on file    Objective: Vital Signs: BP 115/71 (BP Location: Left Arm, Patient Position: Sitting, Cuff Size: Normal)   Pulse 80   Resp 14   Ht 5\' 3"  (1.6 m)   Wt 194 lb 9.6 oz (88.3 kg)   BMI 34.47 kg/m    Physical Exam  Constitutional: She is oriented to person, place, and time. She appears well-developed and well-nourished.  HENT:  Head: Normocephalic and atraumatic.  Eyes: Conjunctivae and EOM are normal.  Neck: Normal range of motion.  Cardiovascular: Normal rate, regular rhythm, normal heart sounds and intact distal pulses.  Pulmonary/Chest: Effort normal and breath sounds normal.  Abdominal: Soft. Bowel sounds are normal.  Lymphadenopathy:    She has no cervical adenopathy.  Neurological: She is alert and oriented to person, place, and time.  Skin: Skin is warm and dry. Capillary refill takes less than 2 seconds.  Psychiatric: She has a normal mood and affect. Her behavior is normal.  Nursing note and vitals reviewed.    Musculoskeletal Exam: C-spine thoracic lumbar spine good range of motion.  Shoulder joints elbow joints wrist joint MCPs PIPs DIPs been good range of motion with no synovitis.  Hip joints knee joints ankles MTPs PIPs been good range of motion with no synovitis.  CDAI Exam: CDAI Score: Not documented Patient Global Assessment: Not documented; Provider Global Assessment: Not documented Swollen: Not  documented; Tender: Not documented Joint Exam   Not documented   There is currently no information documented on the homunculus. Go to the Rheumatology activity and complete the homunculus joint exam.  Investigation: No additional findings.  Imaging: No results found.  Recent Labs: Lab Results  Component Value Date   NA 138 06/05/2018   K 4.0 06/05/2018   CL 103 06/05/2018   CO2 21 06/05/2018   GLUCOSE 88 06/05/2018   BUN 15 06/05/2018   CREATININE 0.72 06/05/2018   PROT 7.8 03/16/2018   CALCIUM 9.3 06/05/2018   GFRAA 102 06/05/2018    Speciality Comments: No specialty comments available.  Procedures:  No procedures performed Allergies: Codeine   Assessment / Plan:     Visit Diagnoses: Sjogren's syndrome with keratoconjunctivitis sicca (HCC) - +ANA, +Ro, +La, sicca symptoms, fatigue.  Patient had no synovitis on examination.  She continues to have some sicca symptoms.  Over-the-counter products were discussed.  Primary osteoarthritis of both hands-she has DIP thickening.  Joint protection muscle strengthening was discussed.  Fibromyalgia-patient states her fibromyalgia is not very active currently.  Shortness of breath - Former smoker, April 03, 2018 chest x-ray normal, she was referred to pulmonologist.  Patient states that her PFTs are pending at this time.  She was also referred to  cardiologist.  Family history of systemic lupus erythematosus (SLE) in mother - AVISE index was negative.  History of depression  Former smoker  Dyslipidemia   Orders: No orders of the defined types were placed in this encounter.  No orders of the defined types were placed in this encounter.     Follow-Up Instructions: Return in about 6 months (around 01/20/2019) for Sjogren's, Osteoarthritis, FMS.   Pollyann Savoy, MD  Note - This record has been created using Animal nutritionist.  Chart creation errors have been sought, but may not always  have been located. Such creation  errors do not reflect on  the standard of medical care.

## 2018-07-22 ENCOUNTER — Ambulatory Visit (INDEPENDENT_AMBULATORY_CARE_PROVIDER_SITE_OTHER): Payer: Medicare Other | Admitting: Rheumatology

## 2018-07-22 ENCOUNTER — Encounter: Payer: Self-pay | Admitting: Rheumatology

## 2018-07-22 VITALS — BP 115/71 | HR 80 | Resp 14 | Ht 63.0 in | Wt 194.6 lb

## 2018-07-22 DIAGNOSIS — M797 Fibromyalgia: Secondary | ICD-10-CM

## 2018-07-22 DIAGNOSIS — Z8659 Personal history of other mental and behavioral disorders: Secondary | ICD-10-CM

## 2018-07-22 DIAGNOSIS — M3501 Sicca syndrome with keratoconjunctivitis: Secondary | ICD-10-CM | POA: Diagnosis not present

## 2018-07-22 DIAGNOSIS — R0602 Shortness of breath: Secondary | ICD-10-CM | POA: Diagnosis not present

## 2018-07-22 DIAGNOSIS — I2510 Atherosclerotic heart disease of native coronary artery without angina pectoris: Secondary | ICD-10-CM | POA: Diagnosis not present

## 2018-07-22 DIAGNOSIS — M19041 Primary osteoarthritis, right hand: Secondary | ICD-10-CM

## 2018-07-22 DIAGNOSIS — Z87891 Personal history of nicotine dependence: Secondary | ICD-10-CM

## 2018-07-22 DIAGNOSIS — E785 Hyperlipidemia, unspecified: Secondary | ICD-10-CM

## 2018-07-22 DIAGNOSIS — M19042 Primary osteoarthritis, left hand: Secondary | ICD-10-CM

## 2018-07-22 DIAGNOSIS — Z8269 Family history of other diseases of the musculoskeletal system and connective tissue: Secondary | ICD-10-CM

## 2018-07-26 NOTE — Progress Notes (Signed)
Cardiology Office Note:    Date:  07/27/2018   ID:  ZAHRIA DING, DOB 12/07/1950, MRN [id number]  PCP:  Nonnie Done., MD  Cardiologist:  Norman Herrlich, MD    Referring MD: Nonnie Done., MD    ASSESSMENT:    1. CAD in native artery   2. Mixed hyperlipidemia    PLAN:    In order of problems listed above:  1. Stable having no anginal discomfort at this time we will continue medical treatment and hold on consideration of coronary angiography.  If she has breakthrough and fails medical therapy angiography and PCI which could be an option. 2. Stable continue her statin   Next appointment: 6 months   Medication Adjustments/Labs and Tests Ordered: Current medicines are reviewed at length with the patient today.  Concerns regarding medicines are outlined above.  No orders of the defined types were placed in this encounter.  No orders of the defined types were placed in this encounter.   Chief Complaint  Patient presents with  . Follow-up  . Coronary Artery Disease    History of Present Illness:    Kaitlyn Hudson is a 67 y.o. female with a hx of CAD by cardiac CTA with moderate mid LAD stenosis and reduced FFR  last seen 06/26/18. Compliance with diet, lifestyle and medications: Yes  She is improved on medical therapy calcium channel blocker no further anginal discomfort but unfortunately has had a flare of her fibromyalgia and now has sleep apnea using CPAP.  No chest pain shortness of breath palpitation or syncope Past Medical History:  Diagnosis Date  . Allergic rhinitis   . Anxiety 04/02/2016  . Chest pressure 05/11/2018  . Depression   . Dyslipidemia 04/08/2018  . Exertional shortness of breath 05/11/2018  . Family history of systemic lupus erythematosus (SLE) in mother 04/08/2018  . Fibromyalgia   . Former smoker 04/08/2018  . History of cervical dysplasia 04/02/2016   2013; hysterectomy d/t recurrent dysplasia s/p LEEP  . History of depression 03/16/2018  . History of  hyperlipidemia 03/16/2018  . Hypercholesteremia 04/02/2016  . Osteoarthritis   . Primary osteoarthritis of both hands 04/08/2018  . Sjogren's syndrome with keratoconjunctivitis sicca (HCC) 04/08/2018   +ANA, +Ro, +La, sicca symptoms, fatigue  . Trochanteric bursitis, right hip 01/09/2018    Past Surgical History:  Procedure Laterality Date  . HYSTEROTOMY    . KNEE SURGERY Right   . LYMPH NODE BIOPSY     groin     Current Medications: Current Meds  Medication Sig  . aspirin EC 81 MG tablet Take 1 tablet (81 mg total) by mouth daily.  Marland Kitchen diltiazem (CARDIZEM CD) 240 MG 24 hr capsule Take 1 capsule (240 mg total) by mouth daily.  Marland Kitchen ibuprofen (ADVIL) 200 MG tablet Take 800 mg by mouth every 6 (six) hours as needed.  . NON FORMULARY Take 0.25 drops by mouth daily as needed. CBD Oil Full Spectrum 1000mg   . pravastatin (PRAVACHOL) 10 MG tablet Take 10 mg by mouth daily.  . sertraline (ZOLOFT) 100 MG tablet Take 100 mg by mouth daily.      Allergies:   Codeine   Social History   Socioeconomic History  . Marital status: Married    Spouse name: Not on file  . Number of children: Not on file  . Years of education: Not on file  . Highest education level: Not on file  Occupational History  . Not on file  Social Needs  .  Financial resource strain: Not on file  . Food insecurity:    Worry: Not on file    Inability: Not on file  . Transportation needs:    Medical: Not on file    Non-medical: Not on file  Tobacco Use  . Smoking status: Former Smoker    Packs/day: 0.50    Years: 30.00    Pack years: 15.00    Types: Cigarettes    Last attempt to quit: 09/02/2010    Years since quitting: 7.9  . Smokeless tobacco: Never Used  Substance and Sexual Activity  . Alcohol use: No    Frequency: Never  . Drug use: Never  . Sexual activity: Not on file  Lifestyle  . Physical activity:    Days per week: Not on file    Minutes per session: Not on file  . Stress: Not on file  Relationships  .  Social connections:    Talks on phone: Not on file    Gets together: Not on file    Attends religious service: Not on file    Active member of club or organization: Not on file    Attends meetings of clubs or organizations: Not on file    Relationship status: Not on file  Other Topics Concern  . Not on file  Social History Narrative  . Not on file     Family History: The patient's family history includes Alzheimer's disease in her father; Breast cancer in her maternal grandmother; Lupus in her mother; Osteoarthritis in her father and mother; Polymyalgia rheumatica in her mother. ROS:   Please see the history of present illness.    All other systems reviewed and are negative.  EKGs/Labs/Other Studies Reviewed:    The following studies were reviewed today:  Recent Labs: 03/16/2018: TSH 3.64 05/15/2018: NT-Pro BNP 64 06/05/2018: BUN 15; Creatinine, Ser 0.72; Potassium 4.0; Sodium 138  Recent Lipid Panel No results found for: CHOL, TRIG, HDL, CHOLHDL, VLDL, LDLCALC, LDLDIRECT  Physical Exam:    VS:  BP (!) 94/52 (BP Location: Left Arm, Patient Position: Sitting, Cuff Size: Large)   Pulse 80   Ht 5\' 3"  (1.6 m)   Wt 193 lb 9.6 oz (87.8 kg)   SpO2 96%   BMI 34.29 kg/m     Wt Readings from Last 3 Encounters:  07/27/18 193 lb 9.6 oz (87.8 kg)  07/22/18 194 lb 9.6 oz (88.3 kg)  07/01/18 190 lb 9.6 oz (86.5 kg)     GEN:  Well nourished, well developed in no acute distress HEENT: Normal NECK: No JVD; No carotid bruits LYMPHATICS: No lymphadenopathy CARDIAC: RRR, no murmurs, rubs, gallops RESPIRATORY:  Clear to auscultation without rales, wheezing or rhonchi  ABDOMEN: Soft, non-tender, non-distended MUSCULOSKELETAL:  No edema; No deformity  SKIN: Warm and dry NEUROLOGIC:  Alert and oriented x 3 PSYCHIATRIC:  Normal affect    Signed, Norman HerrlichBrian Cherie Lasalle, MD  07/27/2018 3:00 PM    [facility]

## 2018-07-27 ENCOUNTER — Ambulatory Visit (INDEPENDENT_AMBULATORY_CARE_PROVIDER_SITE_OTHER): Payer: Medicare Other | Admitting: Cardiology

## 2018-07-27 ENCOUNTER — Encounter: Payer: Self-pay | Admitting: Cardiology

## 2018-07-27 VITALS — BP 94/52 | HR 80 | Ht 63.0 in | Wt 193.6 lb

## 2018-07-27 DIAGNOSIS — I2510 Atherosclerotic heart disease of native coronary artery without angina pectoris: Secondary | ICD-10-CM

## 2018-07-27 DIAGNOSIS — E782 Mixed hyperlipidemia: Secondary | ICD-10-CM

## 2018-07-27 NOTE — Patient Instructions (Signed)

## 2018-10-01 ENCOUNTER — Ambulatory Visit (INDEPENDENT_AMBULATORY_CARE_PROVIDER_SITE_OTHER): Payer: Medicare Other | Admitting: Pulmonary Disease

## 2018-10-01 ENCOUNTER — Encounter: Payer: Self-pay | Admitting: Pulmonary Disease

## 2018-10-01 VITALS — BP 126/78 | HR 81 | Ht 62.5 in | Wt 197.0 lb

## 2018-10-01 DIAGNOSIS — R0602 Shortness of breath: Secondary | ICD-10-CM | POA: Diagnosis not present

## 2018-10-01 DIAGNOSIS — G4733 Obstructive sleep apnea (adult) (pediatric): Secondary | ICD-10-CM

## 2018-10-01 DIAGNOSIS — Z9989 Dependence on other enabling machines and devices: Secondary | ICD-10-CM | POA: Diagnosis not present

## 2018-10-01 LAB — PULMONARY FUNCTION TEST
DL/VA % pred: 84 %
DL/VA: 3.91 ml/min/mmHg/L
DLCO unc % pred: 79 %
DLCO unc: 17.74 ml/min/mmHg
FEF 25-75 Post: 2.66 L/sec
FEF 25-75 Pre: 2.03 L/sec
FEF2575-%Change-Post: 31 %
FEF2575-%Pred-Post: 133 %
FEF2575-%Pred-Pre: 101 %
FEV1-%Change-Post: 6 %
FEV1-%PRED-PRE: 92 %
FEV1-%Pred-Post: 98 %
FEV1-PRE: 2.09 L
FEV1-Post: 2.22 L
FEV1FVC-%CHANGE-POST: 3 %
FEV1FVC-%Pred-Pre: 104 %
FEV6-%Change-Post: 2 %
FEV6-%PRED-POST: 94 %
FEV6-%PRED-PRE: 92 %
FEV6-POST: 2.68 L
FEV6-PRE: 2.61 L
FEV6FVC-%Pred-Post: 104 %
FEV6FVC-%Pred-Pre: 104 %
FVC-%Change-Post: 2 %
FVC-%PRED-POST: 90 %
FVC-%PRED-PRE: 88 %
FVC-POST: 2.68 L
FVC-Pre: 2.61 L
POST FEV6/FVC RATIO: 100 %
PRE FEV1/FVC RATIO: 80 %
PRE FEV6/FVC RATIO: 100 %
Post FEV1/FVC ratio: 83 %
RV % PRED: 111 %
RV: 2.28 L
TLC % PRED: 114 %
TLC: 5.52 L

## 2018-10-01 NOTE — Patient Instructions (Addendum)
With your improved symptoms  I will see you back in the office in about 6 months  As discussed, if you are having problems with the shortness of breath in the summer months, starting a rescue inhaler will be appropriate  Call with any significant concerns  Continue to use your CPAP on a regular basis

## 2018-10-01 NOTE — Progress Notes (Signed)
PFT completed today.Kaitlyn Hudson,CMA  

## 2018-10-01 NOTE — Progress Notes (Signed)
Kaitlyn Hudson    [id number]    May 23, 1951  Primary Care Physician:Slatosky, Marshall Cork., MD  Referring Physician: Enid Skeens., MD [address], [place] [address]  Chief complaint:  Patient was evaluated for obstructive lung disease, history of shortness of breath at rest and with activity Improvement in symptoms compared to last visit Diagnosed with moderate obstructive sleep apnea for which she is on CPAP at present  HPI:  Improvement in symptoms since last visit Less short of breath Has been able to increase activities-continues to do water aerobics Fibromyalgia symptoms are better  Diagnosed with moderate obstructive sleep apnea, has been using CPAP, compliant with use She feels she is not sleeping too well at night-a little bit better tolerated Multiple comorbidities contributing to fatigue during the day including fibromyalgia She does have some shortness of breath with activity  Been evaluated for possible autoimmune disorder History of lupus in her mother She does have joint pains, arthralgias, myalgias, sensitivity to sunlight  Daytime sleepiness is a little bit better  She denies any significant central chest pain or pleuritic chest pains  She does have a history of depression, trochanteric bursitis  pet: Has a dog Occupation: No pertinent occupational history Exposures: No skin exposure history Smoking history: She did smoke in the past quit in 2012 Travel history: No recent travel Relevant family history: Mother had lupus  Outpatient Encounter Medications as of 10/01/2018  Medication Sig  . aspirin EC 81 MG tablet Take 1 tablet (81 mg total) by mouth daily.  Marland Kitchen diltiazem (CARDIZEM CD) 240 MG 24 hr capsule Take 1 capsule (240 mg total) by mouth daily.  Marland Kitchen ibuprofen (ADVIL) 200 MG tablet Take 800 mg by mouth every 6 (six) hours as needed.  . NON FORMULARY Take 0.25 drops by mouth daily as needed. CBD Oil Full Spectrum 1072m  . pravastatin  (PRAVACHOL) 10 MG tablet Take 10 mg by mouth daily.  . sertraline (ZOLOFT) 100 MG tablet Take 100 mg by mouth daily.    No facility-administered encounter medications on file as of 10/01/2018.     Allergies as of 10/01/2018 - Review Complete 10/01/2018  Allergen Reaction Noted  . Codeine Nausea And Vomiting 09/29/2017    Past Medical History:  Diagnosis Date  . Allergic rhinitis   . Anxiety 04/02/2016  . Chest pressure 05/11/2018  . Depression   . Dyslipidemia 04/08/2018  . Exertional shortness of breath 05/11/2018  . Family history of systemic lupus erythematosus (SLE) in mother 04/08/2018  . Fibromyalgia   . Former smoker 04/08/2018  . History of cervical dysplasia 04/02/2016   2013; hysterectomy d/t recurrent dysplasia s/p LEEP  . History of depression 03/16/2018  . History of hyperlipidemia 03/16/2018  . Hypercholesteremia 04/02/2016  . Osteoarthritis   . Primary osteoarthritis of both hands 04/08/2018  . Sjogren's syndrome with keratoconjunctivitis sicca ([facility] 04/08/2018   +ANA, +Ro, +La, sicca symptoms, fatigue  . Trochanteric bursitis, right hip 01/09/2018    Past Surgical History:  Procedure Laterality Date  . HYSTEROTOMY    . KNEE SURGERY Right   . LYMPH NODE BIOPSY     groin     Family History  Problem Relation Age of Onset  . Lupus Mother   . Osteoarthritis Mother   . Polymyalgia rheumatica Mother   . Osteoarthritis Father   . Alzheimer's disease Father   . Breast cancer Maternal Grandmother     Social History   Socioeconomic History  .  Marital status: Married    Spouse name: Not on file  . Number of children: Not on file  . Years of education: Not on file  . Highest education level: Not on file  Occupational History  . Not on file  Social Needs  . Financial resource strain: Not on file  . Food insecurity:    Worry: Not on file    Inability: Not on file  . Transportation needs:    Medical: Not on file    Non-medical: Not on file  Tobacco Use  . Smoking  status: Former Smoker    Packs/day: 0.50    Years: 30.00    Pack years: 15.00    Types: Cigarettes    Last attempt to quit: 09/02/2010    Years since quitting: 8.0  . Smokeless tobacco: Never Used  Substance and Sexual Activity  . Alcohol use: No    Frequency: Never  . Drug use: Never  . Sexual activity: Not on file  Lifestyle  . Physical activity:    Days per week: Not on file    Minutes per session: Not on file  . Stress: Not on file  Relationships  . Social connections:    Talks on phone: Not on file    Gets together: Not on file    Attends religious service: Not on file    Active member of club or organization: Not on file    Attends meetings of clubs or organizations: Not on file    Relationship status: Not on file  . Intimate partner violence:    Fear of current or ex partner: Not on file    Emotionally abused: Not on file    Physically abused: Not on file    Forced sexual activity: Not on file  Other Topics Concern  . Not on file  Social History Narrative  . Not on file    Review of Systems  Constitutional: Positive for activity change and fatigue. Negative for fever.  HENT: Negative.   Eyes: Negative.   Respiratory: Negative for shortness of breath.   Cardiovascular: Negative for chest pain.  Gastrointestinal: Negative.   Musculoskeletal: Positive for arthralgias and myalgias.    Vitals:   10/01/18 0949  BP: 126/78  Pulse: 81  SpO2: 98%     Physical Exam  Constitutional: She appears well-developed and well-nourished.  HENT:  Head: Normocephalic and atraumatic.  Eyes: Pupils are equal, round, and reactive to light. Conjunctivae are normal. Right eye exhibits no discharge. Left eye exhibits no discharge.  Neck: Normal range of motion. Neck supple. No tracheal deviation present. No thyromegaly present.  Cardiovascular: Normal rate and regular rhythm.  Pulmonary/Chest: Breath sounds normal. No respiratory distress. She has no wheezes. She has no rales.    Abdominal: Soft. Bowel sounds are normal.  Psychiatric: Her behavior is normal.   Data Reviewed:  Latest ESR was 6 She did have an elevated ESR -as high as 50 in the past ANA is positive, anti-Ro and anti-La positive Compliance data reveals 90% compliance with a residual AHI of 2.9  Pulmonary function study performed 10/01/2018-within normal limits-reviewed with patient  Assessment:  Shortness of breath -Improvement in symptoms -Able to tolerate activities better  Past history of smoking -Has no significant associated diagnoses -PFT within normal limits-no obstructive disease  History of daytime sleepiness and nonrestorative sleep Significant weight gain in the recent months -Diagnosed with moderate obstructive sleep apnea for which she is on CPAP at present -Encouraged to continue using CPAP  Autoimmune disease work-up is ongoing at present--may have Sjogren's  Polyarthralgia  Plan/Recommendations: Encouraged to continue CPAP use  Able to tolerate CPAP a little bit better, still not perfect  Encouraged to continue to exercise on a regular basis as physical activity will also help nighttime sleep   We will see her back in 6 months    Sherrilyn Rist MD [facility] 10/01/2018, 9:51 AM  CC: Enid Skeens., MD

## 2018-10-29 ENCOUNTER — Other Ambulatory Visit: Payer: Self-pay | Admitting: Cardiology

## 2019-01-13 NOTE — Progress Notes (Deleted)
Office Visit Note  Patient: Kaitlyn Hudson             Date of Birth: 1950/12/08           MRN: [id number]             PCP: Nonnie Done., MD Referring: Nonnie Done., MD Visit Date: 01/21/2019 Occupation: @GUAROCC @  Subjective:  No chief complaint on file.   History of Present Illness: Kaitlyn Hudson is a 68 y.o. female ***   Activities of Daily Living:  Patient reports morning stiffness for *** {minute/hour:19697}.   Patient {ACTIONS;DENIES/REPORTS:21021675::"Denies"} nocturnal pain.  Difficulty dressing/grooming: {ACTIONS;DENIES/REPORTS:21021675::"Denies"} Difficulty climbing stairs: {ACTIONS;DENIES/REPORTS:21021675::"Denies"} Difficulty getting out of chair: {ACTIONS;DENIES/REPORTS:21021675::"Denies"} Difficulty using hands for taps, buttons, cutlery, and/or writing: {ACTIONS;DENIES/REPORTS:21021675::"Denies"}  No Rheumatology ROS completed.   PMFS History:  Patient Active Problem List   Diagnosis Date Noted  . CAD in native artery 06/26/2018  . Sleep apnea 05/15/2018  . Angina pectoris (HCC) 05/15/2018  . SOB (shortness of breath) on exertion 05/11/2018  . Chest pressure 05/11/2018  . Fibromyalgia 05/11/2018  . Sjogren's syndrome with keratoconjunctivitis sicca (HCC) 04/08/2018  . Former smoker 04/08/2018  . Family history of systemic lupus erythematosus (SLE) in mother 04/08/2018  . Dyslipidemia 04/08/2018  . Primary osteoarthritis of both hands 04/08/2018  . History of depression 03/16/2018  . History of hyperlipidemia 03/16/2018  . Anxiety 04/02/2016  . History of cervical dysplasia 04/02/2016  . Hyperlipidemia 04/02/2016    Past Medical History:  Diagnosis Date  . Allergic rhinitis   . Anxiety 04/02/2016  . Chest pressure 05/11/2018  . Depression   . Dyslipidemia 04/08/2018  . Exertional shortness of breath 05/11/2018  . Family history of systemic lupus erythematosus (SLE) in mother 04/08/2018  . Fibromyalgia   . Former smoker 04/08/2018  . History of  cervical dysplasia 04/02/2016   2013; hysterectomy d/t recurrent dysplasia s/p LEEP  . History of depression 03/16/2018  . History of hyperlipidemia 03/16/2018  . Hypercholesteremia 04/02/2016  . Osteoarthritis   . Primary osteoarthritis of both hands 04/08/2018  . Sjogren's syndrome with keratoconjunctivitis sicca (HCC) 04/08/2018   +ANA, +Ro, +La, sicca symptoms, fatigue  . Trochanteric bursitis, right hip 01/09/2018    Family History  Problem Relation Age of Onset  . Lupus Mother   . Osteoarthritis Mother   . Polymyalgia rheumatica Mother   . Osteoarthritis Father   . Alzheimer's disease Father   . Breast cancer Maternal Grandmother    Past Surgical History:  Procedure Laterality Date  . HYSTEROTOMY    . KNEE SURGERY Right   . LYMPH NODE BIOPSY     groin    Social History   Social History Narrative  . Not on file   Immunization History  Administered Date(s) Administered  . Influenza, High Dose Seasonal PF 06/24/2018     Objective: Vital Signs: There were no vitals taken for this visit.   Physical Exam   Musculoskeletal Exam: ***  CDAI Exam: CDAI Score: Not documented Patient Global Assessment: Not documented; Provider Global Assessment: Not documented Swollen: Not documented; Tender: Not documented Joint Exam   Not documented   There is currently no information documented on the homunculus. Go to the Rheumatology activity and complete the homunculus joint exam.  Investigation: No additional findings.  Imaging: No results found.  Recent Labs: Lab Results  Component Value Date   NA 138 06/05/2018   K 4.0 06/05/2018   CL 103 06/05/2018   CO2 21 06/05/2018  GLUCOSE 88 06/05/2018   BUN 15 06/05/2018   CREATININE 0.72 06/05/2018   PROT 7.8 03/16/2018   CALCIUM 9.3 06/05/2018   GFRAA 102 06/05/2018    Speciality Comments: No specialty comments available.  Procedures:  No procedures performed Allergies: Codeine   Assessment / Plan:     Visit  Diagnoses: No diagnosis found.   Orders: No orders of the defined types were placed in this encounter.  No orders of the defined types were placed in this encounter.   Face-to-face time spent with patient was *** minutes. Greater than 50% of time was spent in counseling and coordination of care.  Follow-Up Instructions: No follow-ups on file.   Gearldine Bienenstockaylor M Dreden Rivere, PA-C  Note - This record has been created using Dragon software.  Chart creation errors have been sought, but may not always  have been located. Such creation errors do not reflect on  the standard of medical care.

## 2019-01-21 ENCOUNTER — Ambulatory Visit: Payer: Self-pay | Admitting: Rheumatology

## 2019-03-01 ENCOUNTER — Other Ambulatory Visit: Payer: Self-pay | Admitting: Cardiology

## 2019-03-25 ENCOUNTER — Ambulatory Visit: Payer: Medicare Other | Admitting: Rheumatology

## 2019-04-16 NOTE — Progress Notes (Signed)
Office Visit Note  Patient: Kaitlyn Hudson             Date of Birth: October 02, 1950           MRN: [id number]             PCP: Nonnie DoneSlatosky, John J., MD Referring: Nonnie DoneSlatosky, John J., MD Visit Date: 04/28/2019 Occupation: @GUAROCC @  Subjective:  Sicca symptoms   History of Present Illness: Kaitlyn Hudson is a 68 y.o. female with history of Sjogren's, fibromyalgia, and osteoarthritis.  She has chronic sicca symptoms.  She uses restasis BID for eye dryness.  She has been trying biotene products and drinks lots of fluids.  She is having pain in both CMC joints.  She denies any joint swelling.  She has intermittent right trochanteric bursitis.  Her fibromyalgia has been manageable.   Activities of Daily Living:  Patient reports morning stiffness for 5-10 minutes.   Patient Reports nocturnal pain.  Difficulty dressing/grooming: Denies Difficulty climbing stairs: Denies Difficulty getting out of chair: Denies Difficulty using hands for taps, buttons, cutlery, and/or writing: Reports  Review of Systems  Constitutional: Negative for fatigue.  HENT: Positive for mouth dryness. Negative for mouth sores and nose dryness.   Eyes: Positive for itching and dryness. Negative for pain and visual disturbance.  Respiratory: Negative for cough, hemoptysis, shortness of breath, wheezing and difficulty breathing.   Cardiovascular: Negative for chest pain, palpitations, hypertension and swelling in legs/feet.  Gastrointestinal: Negative for blood in stool, constipation and diarrhea.  Endocrine: Negative for increased urination.  Genitourinary: Negative for difficulty urinating and painful urination.  Musculoskeletal: Positive for arthralgias, joint pain and morning stiffness. Negative for joint swelling, myalgias, muscle weakness, muscle tenderness and myalgias.  Skin: Negative for color change, pallor, rash, hair loss, nodules/bumps, redness, skin tightness, ulcers and sensitivity to sunlight.    Allergic/Immunologic: Negative for susceptible to infections.  Neurological: Negative for dizziness, light-headedness, numbness, headaches, memory loss and weakness.  Hematological: Negative for bruising/bleeding tendency and swollen glands.  Psychiatric/Behavioral: Negative for depressed mood, confusion and sleep disturbance. The patient is not nervous/anxious.     PMFS History:  Patient Active Problem List   Diagnosis Date Noted   CAD in native artery 06/26/2018   Sleep apnea 05/15/2018   Angina pectoris (HCC) 05/15/2018   SOB (shortness of breath) on exertion 05/11/2018   Chest pressure 05/11/2018   Fibromyalgia 05/11/2018   Sjogren's syndrome with keratoconjunctivitis sicca (HCC) 04/08/2018   Former smoker 04/08/2018   Family history of systemic lupus erythematosus (SLE) in mother 04/08/2018   Dyslipidemia 04/08/2018   Primary osteoarthritis of both hands 04/08/2018   History of depression 03/16/2018   History of hyperlipidemia 03/16/2018   Anxiety 04/02/2016   History of cervical dysplasia 04/02/2016   Hyperlipidemia 04/02/2016    Past Medical History:  Diagnosis Date   Allergic rhinitis    Anxiety 04/02/2016   Chest pressure 05/11/2018   Depression    Dyslipidemia 04/08/2018   Exertional shortness of breath 05/11/2018   Family history of systemic lupus erythematosus (SLE) in mother 04/08/2018   Fibromyalgia    Former smoker 04/08/2018   History of cervical dysplasia 04/02/2016   2013; hysterectomy d/t recurrent dysplasia s/p LEEP   History of depression 03/16/2018   History of hyperlipidemia 03/16/2018   Hypercholesteremia 04/02/2016   Osteoarthritis    Primary osteoarthritis of both hands 04/08/2018   Sjogren's syndrome with keratoconjunctivitis sicca (HCC) 04/08/2018   +ANA, +Ro, +La, sicca symptoms, fatigue   Trochanteric  bursitis, right hip 01/09/2018    Family History  Problem Relation Age of Onset   Lupus Mother    Osteoarthritis Mother     Polymyalgia rheumatica Mother    Osteoarthritis Father    Alzheimer's disease Father    Breast cancer Maternal Grandmother    Past Surgical History:  Procedure Laterality Date   HYSTEROTOMY     KNEE SURGERY Right    LYMPH NODE BIOPSY     groin    Social History   Social History Narrative   Not on file   Immunization History  Administered Date(s) Administered   Influenza, High Dose Seasonal PF 06/24/2018     Objective: Vital Signs: BP 117/74 (BP Location: Left Arm, Patient Position: Sitting, Cuff Size: Normal)    Pulse 65    Resp 14    Ht 5' 3.5" (1.613 m)    Wt 191 lb 3.2 oz (86.7 kg)    BMI 33.34 kg/m    Physical Exam Vitals signs and nursing note reviewed.  Constitutional:      Appearance: She is well-developed.  HENT:     Head: Normocephalic and atraumatic.  Eyes:     Conjunctiva/sclera: Conjunctivae normal.  Neck:     Musculoskeletal: Normal range of motion.  Cardiovascular:     Rate and Rhythm: Normal rate and regular rhythm.     Heart sounds: Normal heart sounds.  Pulmonary:     Effort: Pulmonary effort is normal.     Breath sounds: Normal breath sounds.  Abdominal:     General: Bowel sounds are normal.     Palpations: Abdomen is soft.  Lymphadenopathy:     Cervical: No cervical adenopathy.  Skin:    General: Skin is warm and dry.     Capillary Refill: Capillary refill takes less than 2 seconds.  Neurological:     Mental Status: She is alert and oriented to person, place, and time.  Psychiatric:        Behavior: Behavior normal.      Musculoskeletal Exam: C-spine, thoracic spine, and lumbar spine good ROM. Shoulder joints, elbow joints, wrist joints, MCPs, PIPs, and DIPs good ROM with no synovitis. DIP synovial thickening.  Bilateral CMC joint tenderness.  Left CMC joint synovial thickening.  Hip joints, knee joints, ankle joints, MTPs, PIPs, and DIPs good ROM with no synovitis.  No warmth or effusion of knee joints.  No tenderness or  swelling of ankle joints.  Right trochanteric bursa tenderness.    CDAI Exam: CDAI Score: -- Patient Global: --; Provider Global: -- Swollen: --; Tender: -- Joint Exam   No joint exam has been documented for this visit   There is currently no information documented on the homunculus. Go to the Rheumatology activity and complete the homunculus joint exam.  Investigation: No additional findings.  Imaging: No results found.  Recent Labs: Lab Results  Component Value Date   NA 138 06/05/2018   K 4.0 06/05/2018   CL 103 06/05/2018   CO2 21 06/05/2018   GLUCOSE 88 06/05/2018   BUN 15 06/05/2018   CREATININE 0.72 06/05/2018   PROT 7.8 03/16/2018   CALCIUM 9.3 06/05/2018   GFRAA 102 06/05/2018    Speciality Comments: No specialty comments available.  Procedures:  No procedures performed Allergies: Codeine   Assessment / Plan:     Visit Diagnoses: Sjogren's syndrome with keratoconjunctivitis sicca (HCC) - +ANA, +Ro, +La, sicca symptoms, fatigue: She has chronic sicca symptoms.  She has been using biotene products and  Restasis eye drops for symptomatic relief. We will obtain CBC, CMP, UA, RF, and SPEP today.  She will follow up in 6  Months.   Primary osteoarthritis of both hands: She has DIP synovial thickening consistent with osteoarthritis of bilateral hands.  She has left CMC joint synovial thickening.  She has tenderness of bilateral CMC joints.  She has been having increased discomfort in both CMC joints.  We discussed natural anti-inflammatories to take and she was provided an informational handout.  She takes ibuprofen as needed for pain relief.  Joint protection and muscle strengthening were discussed.   Fibromyalgia: Her fibromyalgia pain has been manageable recently.  She has had mild muscle aches and muscle tenderness.  She has right trochanter bursitis.  She was encouraged to perform stretching exercises.  She was given a handout of stretching exercises to  perform.  Osteoporosis screening: DEXA in 11/20/11 was normal.  We will order updated DEXA today. She should be taking calcium 1200 mg daily and vitamin D 2000 units.   Other medical conditions are listed as follows:   Family history of systemic lupus erythematosus (SLE) in mother - AVISE index was negative.  Shortness of breath - Former smoker, April 03, 2018 chest x-ray normal, she was referred to pulmonologist.  She was also referred to cardiologist.   Dyslipidemia  History of depression  Former smoker  Orders: Orders Placed This Encounter  Procedures   CBC with Differential/Platelet   COMPLETE METABOLIC PANEL WITH GFR   Urinalysis, Routine w reflex microscopic   Serum protein electrophoresis with reflex   Rheumatoid factor   No orders of the defined types were placed in this encounter.   Face-to-face time spent with patient was 30 minutes. Greater than 50% of time was spent in counseling and coordination of care.  Follow-Up Instructions: Return in about 6 months (around 10/29/2019) for Sjogren's syndrome, Fibromyalgia, Osteoarthritis.   Sherron Alesaylor Bianey Tesoro PA-C  I examined and evaluated the patient with Sherron Alesaylor Kassondra Geil PA.  Patient continues to have sicca symptoms.  We had detailed discussion regarding over-the-counter products.  She also has some discomfort from osteoarthritis.  Natural anti-inflammatories were discussed.  We will also schedule a bone density as she has not had one in a long time.  The plan of care was discussed as noted above.  Pollyann SavoyShaili Deveshwar, MD  Note - This record has been created using Animal nutritionistDragon software.  Chart creation errors have been sought, but may not always  have been located. Such creation errors do not reflect on  the standard of medical care.

## 2019-04-28 ENCOUNTER — Other Ambulatory Visit: Payer: Self-pay

## 2019-04-28 ENCOUNTER — Encounter: Payer: Self-pay | Admitting: Rheumatology

## 2019-04-28 ENCOUNTER — Telehealth: Payer: Self-pay

## 2019-04-28 ENCOUNTER — Ambulatory Visit (INDEPENDENT_AMBULATORY_CARE_PROVIDER_SITE_OTHER): Payer: Medicare Other | Admitting: Rheumatology

## 2019-04-28 VITALS — BP 117/74 | HR 65 | Resp 14 | Ht 63.5 in | Wt 191.2 lb

## 2019-04-28 DIAGNOSIS — M19042 Primary osteoarthritis, left hand: Secondary | ICD-10-CM

## 2019-04-28 DIAGNOSIS — M19041 Primary osteoarthritis, right hand: Secondary | ICD-10-CM | POA: Diagnosis not present

## 2019-04-28 DIAGNOSIS — E785 Hyperlipidemia, unspecified: Secondary | ICD-10-CM

## 2019-04-28 DIAGNOSIS — Z13820 Encounter for screening for osteoporosis: Secondary | ICD-10-CM

## 2019-04-28 DIAGNOSIS — M797 Fibromyalgia: Secondary | ICD-10-CM

## 2019-04-28 DIAGNOSIS — Z8659 Personal history of other mental and behavioral disorders: Secondary | ICD-10-CM

## 2019-04-28 DIAGNOSIS — M3501 Sicca syndrome with keratoconjunctivitis: Secondary | ICD-10-CM

## 2019-04-28 DIAGNOSIS — Z8269 Family history of other diseases of the musculoskeletal system and connective tissue: Secondary | ICD-10-CM

## 2019-04-28 DIAGNOSIS — Z87891 Personal history of nicotine dependence: Secondary | ICD-10-CM

## 2019-04-28 DIAGNOSIS — R0602 Shortness of breath: Secondary | ICD-10-CM

## 2019-04-28 NOTE — Patient Instructions (Signed)
 Hip Bursitis Rehab Ask your health care provider which exercises are safe for you. Do exercises exactly as told by your health care provider and adjust them as directed. It is normal to feel mild stretching, pulling, tightness, or discomfort as you do these exercises. Stop right away if you feel sudden pain or your pain gets worse. Do not begin these exercises until told by your health care provider. Stretching exercise This exercise warms up your muscles and joints and improves the movement and flexibility of your hip. This exercise also helps to relieve pain and stiffness. Iliotibial band stretch An iliotibial band is a strong band of muscle tissue that runs from the outer side of your hip to the outer side of your thigh and knee. 1. Lie on your side with your left / right leg in the top position. 2. Bend your left / right knee and grab your ankle. Stretch out your bottom arm to help you balance. 3. Slowly bring your knee back so your thigh is behind your body. 4. Slowly lower your knee toward the floor until you feel a gentle stretch on the outside of your left / right thigh. If you do not feel a stretch and your knee will not fall farther, place the heel of your other foot on top of your knee and pull your knee down toward the floor with your foot. 5. Hold this position for __________ seconds. 6. Slowly return to the starting position. Repeat __________ times. Complete this exercise __________ times a day. Strengthening exercises These exercises build strength and endurance in your hip and pelvis. Endurance is the ability to use your muscles for a long time, even after they get tired. Bridge This exercise strengthens the muscles that move your thigh backward (hip extensors). 1. Lie on your back on a firm surface with your knees bent and your feet flat on the floor. 2. Tighten your buttocks muscles and lift your buttocks off the floor until your trunk is level with your thighs. ? Do not  arch your back. ? You should feel the muscles working in your buttocks and the back of your thighs. If you do not feel these muscles, slide your feet 1-2 inches (2.5-5 cm) farther away from your buttocks. ? If this exercise is too easy, try doing it with your arms crossed over your chest. 3. Hold this position for __________ seconds. 4. Slowly lower your hips to the starting position. 5. Let your muscles relax completely after each repetition. Repeat __________ times. Complete this exercise __________ times a day. Squats This exercise strengthens the muscles in front of your thigh and knee (quadriceps). 1. Stand in front of a table, with your feet and knees pointing straight ahead. You may rest your hands on the table for balance but not for support. 2. Slowly bend your knees and lower your hips like you are going to sit in a chair. ? Keep your weight over your heels, not over your toes. ? Keep your lower legs upright so they are parallel with the table legs. ? Do not let your hips go lower than your knees. ? Do not bend lower than told by your health care provider. ? If your hip pain increases, do not bend as low. 3. Hold the squat position for __________ seconds. 4. Slowly push with your legs to return to standing. Do not use your hands to pull yourself to standing. Repeat __________ times. Complete this exercise __________ times a day. Hip hike 1.   Stand sideways on a bottom step. Stand on your left / right leg with your other foot unsupported next to the step. You can hold on to the railing or wall for balance if needed. 2. Keep your knees straight and your torso square. Then lift your left / right hip up toward the ceiling. 3. Hold this position for __________ seconds. 4. Slowly let your left / right hip lower toward the floor, past the starting position. Your foot should get closer to the floor. Do not lean or bend your knees. Repeat __________ times. Complete this exercise __________  times a day. Single leg stand 1. Without shoes, stand near a railing or in a doorway. You may hold on to the railing or door frame as needed for balance. 2. Squeeze your left / right buttock muscles, then lift up your other foot. ? Do not let your left / right hip push out to the side. ? It is helpful to stand in front of a mirror for this exercise so you can watch your hip. 3. Hold this position for __________ seconds. Repeat __________ times. Complete this exercise __________ times a day. This information is not intended to replace advice given to you by your health care provider. Make sure you discuss any questions you have with your health care provider. Document Released: 09/26/2004 Document Revised: 12/14/2018 Document Reviewed: 12/14/2018 Elsevier Patient Education  2020 [employer]  

## 2019-04-28 NOTE — Telephone Encounter (Signed)
Patient will contact her PCP, Dr. Wendie Agreste about ordering a bone density scan. Patient had her last DEXA performed at [facility] in 2013. Patient will call the office if Dr. Wendie Agreste cannot order the DEXA.

## 2019-04-30 LAB — CBC WITH DIFFERENTIAL/PLATELET
Absolute Monocytes: 320 cells/uL (ref 200–950)
Basophils Absolute: 70 cells/uL (ref 0–200)
Basophils Relative: 1.7 %
Eosinophils Absolute: 148 cells/uL (ref 15–500)
Eosinophils Relative: 3.6 %
HCT: 44.2 % (ref 35.0–45.0)
Hemoglobin: 14.4 g/dL (ref 11.7–15.5)
Lymphs Abs: 1136 cells/uL (ref 850–3900)
MCH: 27.9 pg (ref 27.0–33.0)
MCHC: 32.6 g/dL (ref 32.0–36.0)
MCV: 85.7 fL (ref 80.0–100.0)
MPV: 11 fL (ref 7.5–12.5)
Monocytes Relative: 7.8 %
Neutro Abs: 2427 cells/uL (ref 1500–7800)
Neutrophils Relative %: 59.2 %
Platelets: 189 10*3/uL (ref 140–400)
RBC: 5.16 10*6/uL — ABNORMAL HIGH (ref 3.80–5.10)
RDW: 13.5 % (ref 11.0–15.0)
Total Lymphocyte: 27.7 %
WBC: 4.1 10*3/uL (ref 3.8–10.8)

## 2019-04-30 LAB — PROTEIN ELECTROPHORESIS, SERUM, WITH REFLEX
Albumin ELP: 4.5 g/dL (ref 3.8–4.8)
Alpha 1: 0.2 g/dL (ref 0.2–0.3)
Alpha 2: 0.6 g/dL (ref 0.5–0.9)
Beta 2: 0.3 g/dL (ref 0.2–0.5)
Beta Globulin: 0.4 g/dL (ref 0.4–0.6)
Gamma Globulin: 1.6 g/dL (ref 0.8–1.7)
Total Protein: 7.6 g/dL (ref 6.1–8.1)

## 2019-04-30 LAB — URINALYSIS, ROUTINE W REFLEX MICROSCOPIC
Bilirubin Urine: NEGATIVE
Glucose, UA: NEGATIVE
Hgb urine dipstick: NEGATIVE
Ketones, ur: NEGATIVE
Leukocytes,Ua: NEGATIVE
Nitrite: NEGATIVE
Protein, ur: NEGATIVE
Specific Gravity, Urine: 1.007 (ref 1.001–1.03)
pH: 5.5 (ref 5.0–8.0)

## 2019-04-30 LAB — COMPLETE METABOLIC PANEL WITH GFR
AG Ratio: 1.5 (calc) (ref 1.0–2.5)
ALT: 6 U/L (ref 6–29)
AST: 30 U/L (ref 10–35)
Albumin: 4.6 g/dL (ref 3.6–5.1)
Alkaline phosphatase (APISO): 54 U/L (ref 37–153)
BUN/Creatinine Ratio: 17 (calc) (ref 6–22)
BUN: 18 mg/dL (ref 7–25)
CO2: 24 mmol/L (ref 20–32)
Calcium: 10.6 mg/dL — ABNORMAL HIGH (ref 8.6–10.4)
Chloride: 105 mmol/L (ref 98–110)
Creat: 1.05 mg/dL — ABNORMAL HIGH (ref 0.50–0.99)
GFR, Est African American: 64 mL/min/{1.73_m2} (ref >=60)
GFR, Est Non African American: 55 mL/min/{1.73_m2} — ABNORMAL LOW (ref >=60)
Globulin: 3.1 g/dL (calc) (ref 1.9–3.7)
Glucose, Bld: 90 mg/dL (ref 65–99)
Potassium: 4.2 mmol/L (ref 3.5–5.3)
Sodium: 140 mmol/L (ref 135–146)
Total Bilirubin: 0.5 mg/dL (ref 0.2–1.2)
Total Protein: 7.7 g/dL (ref 6.1–8.1)

## 2019-04-30 LAB — RHEUMATOID FACTOR: Rhuematoid fact SerPl-aCnc: 28 IU/mL — ABNORMAL HIGH (ref <14)

## 2019-04-30 NOTE — Progress Notes (Signed)
RBC count is borderline elevated.  Rest of CBC WNL.  Creatinine is borderline elevated and GFR is 55.  Please advise patient to avoid NSAIDs.  Forward to PCP.   UA normal.  SPEP WNL.  RF mildly positive.  We will continue to monitor.

## 2019-05-05 ENCOUNTER — Telehealth: Payer: Self-pay | Admitting: Rheumatology

## 2019-05-05 NOTE — Telephone Encounter (Signed)
Attempted to contact the patient and left message for patient to call the office.  

## 2019-05-05 NOTE — Telephone Encounter (Signed)
Patient left a voicemail stating she was returning your call.   

## 2019-05-22 IMAGING — MG DIGITAL SCREENING BILATERAL MAMMOGRAM WITH TOMO AND CAD
8 series · 8 of 24 positions shown · non-contrast
Comparison: Previous exam(s).

CLINICAL DATA: Screening.

EXAM:
DIGITAL SCREENING BILATERAL MAMMOGRAM WITH TOMO AND CAD

[L CC synth-2D]
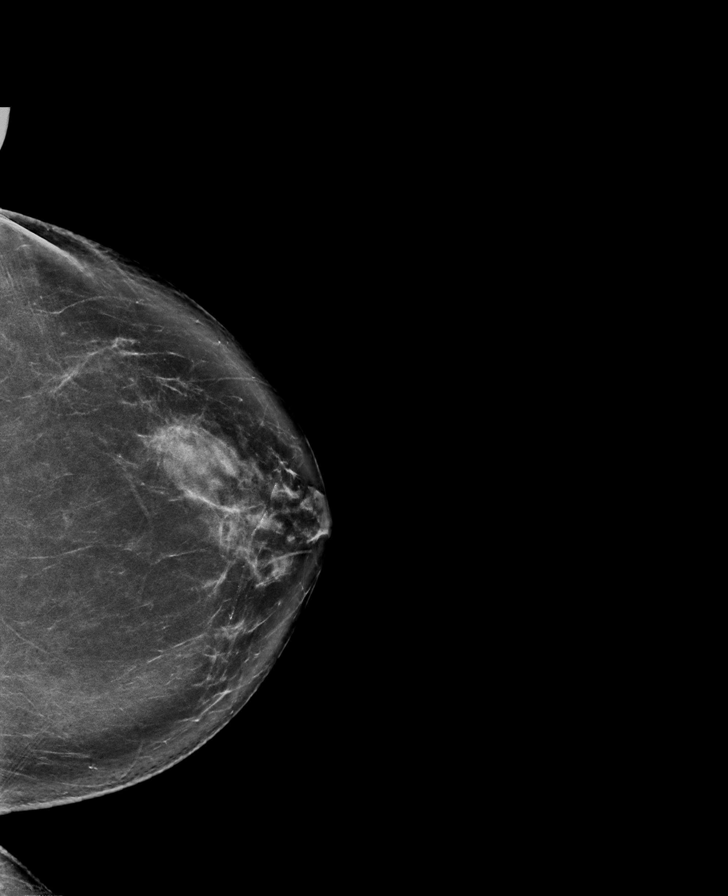

[R CC synth-2D]
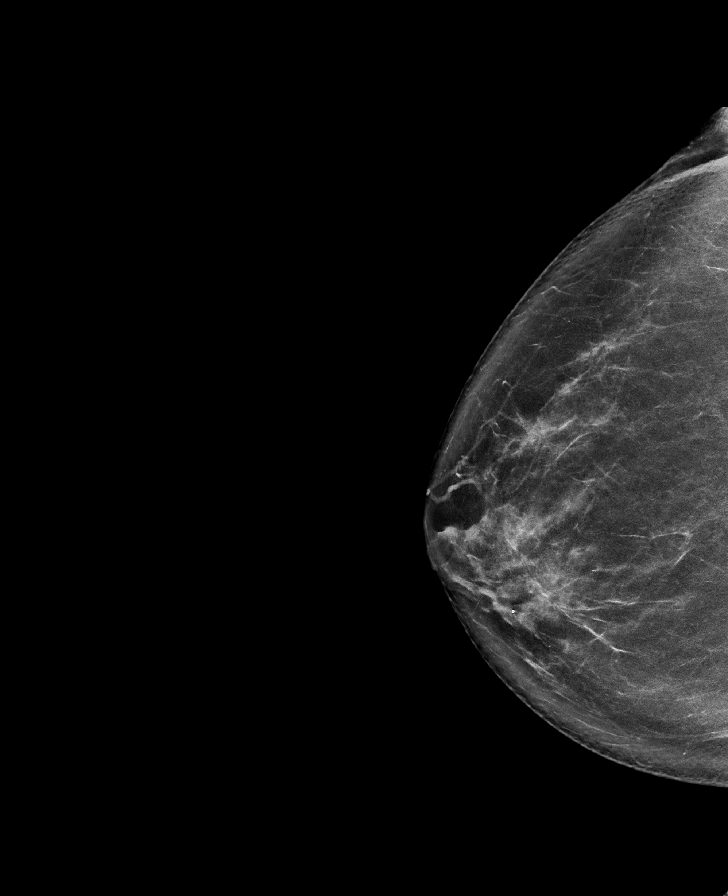

[L MLO synth-2D]
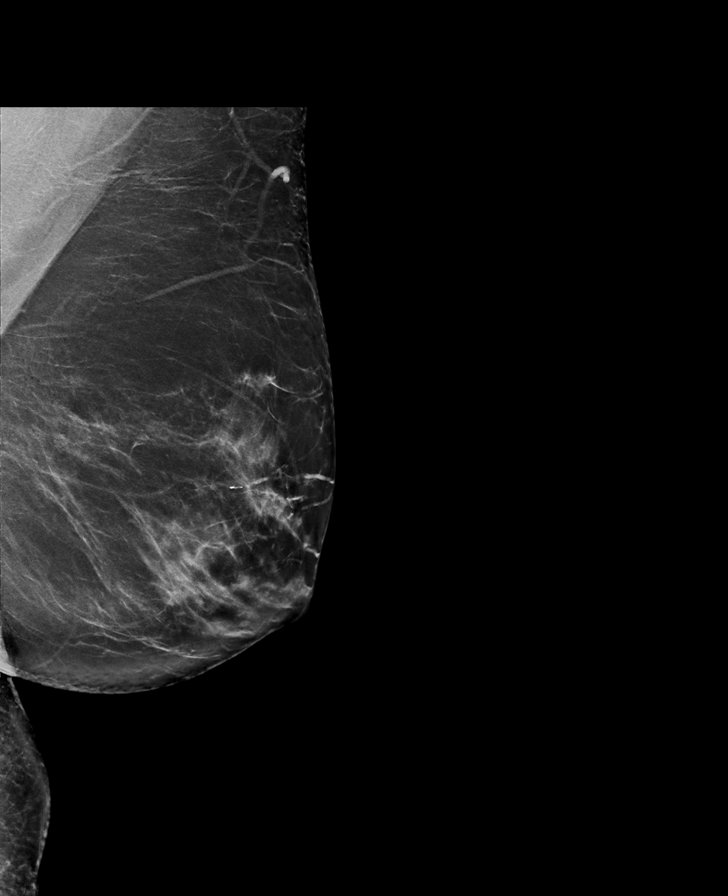

[R MLO synth-2D]
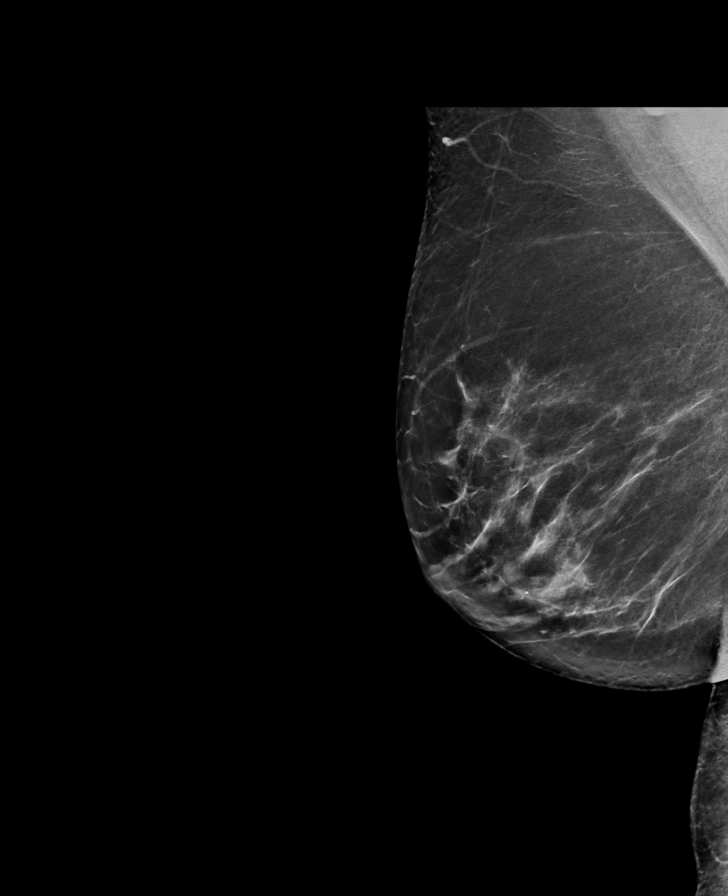

[L MLO tomo · tomo slice 43/84.0]
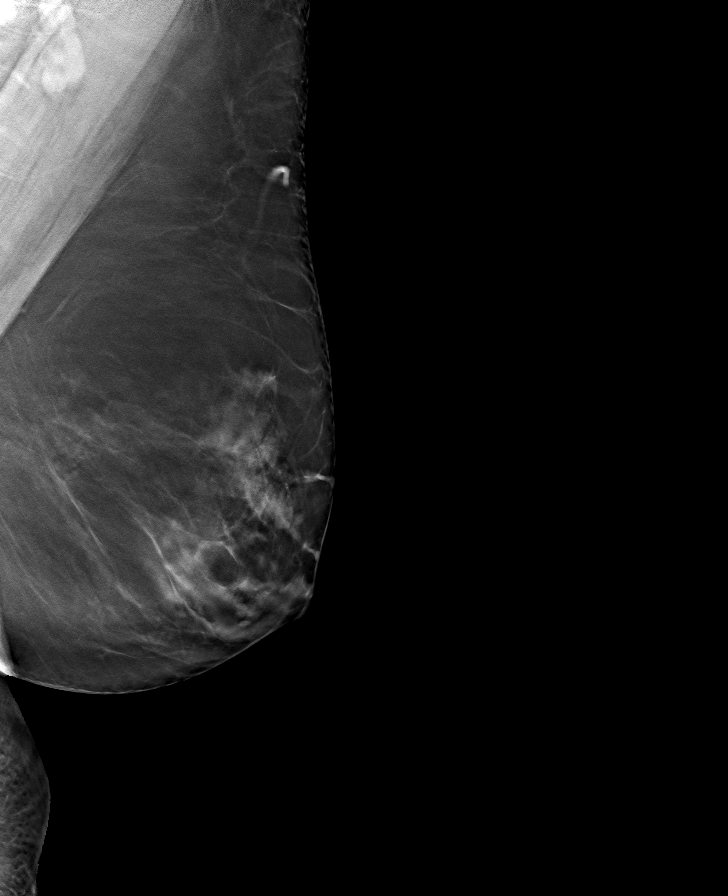

[L CC tomo · tomo slice 43/85.0]
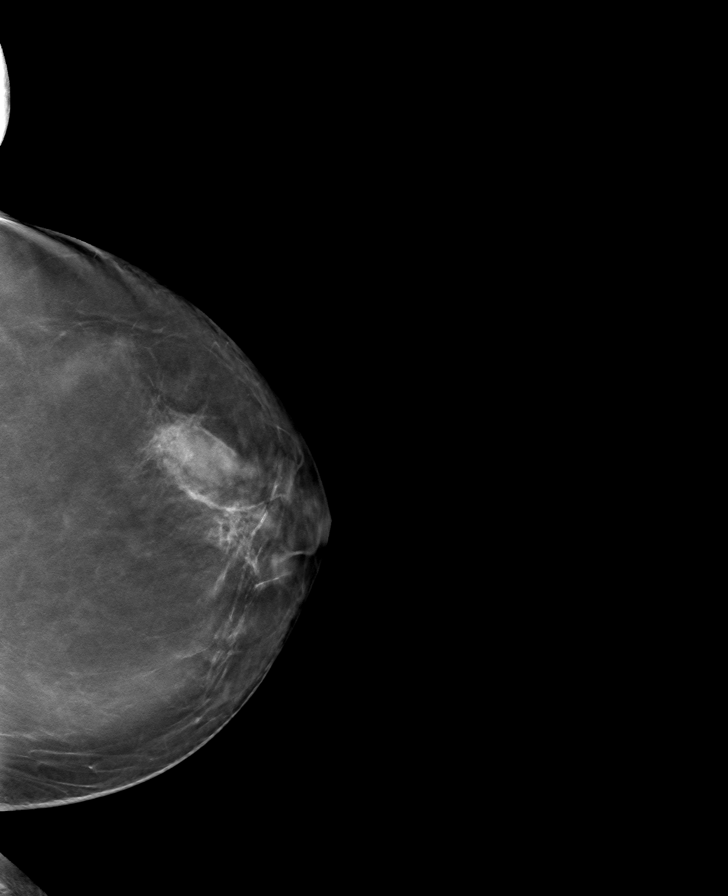

[R CC tomo · tomo slice 43/84.0]
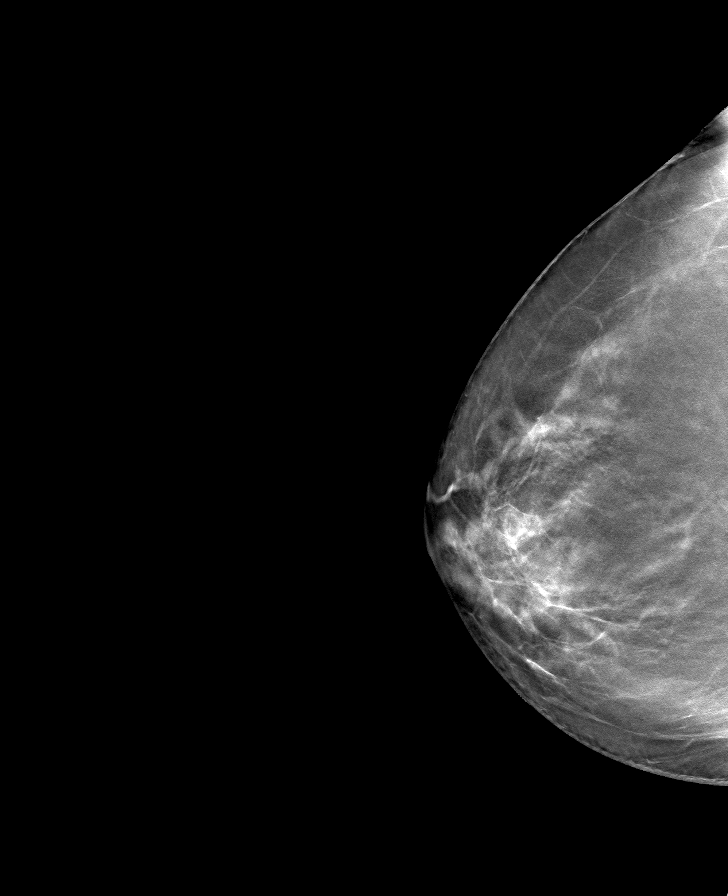

[R MLO tomo · tomo slice 42/83.0]
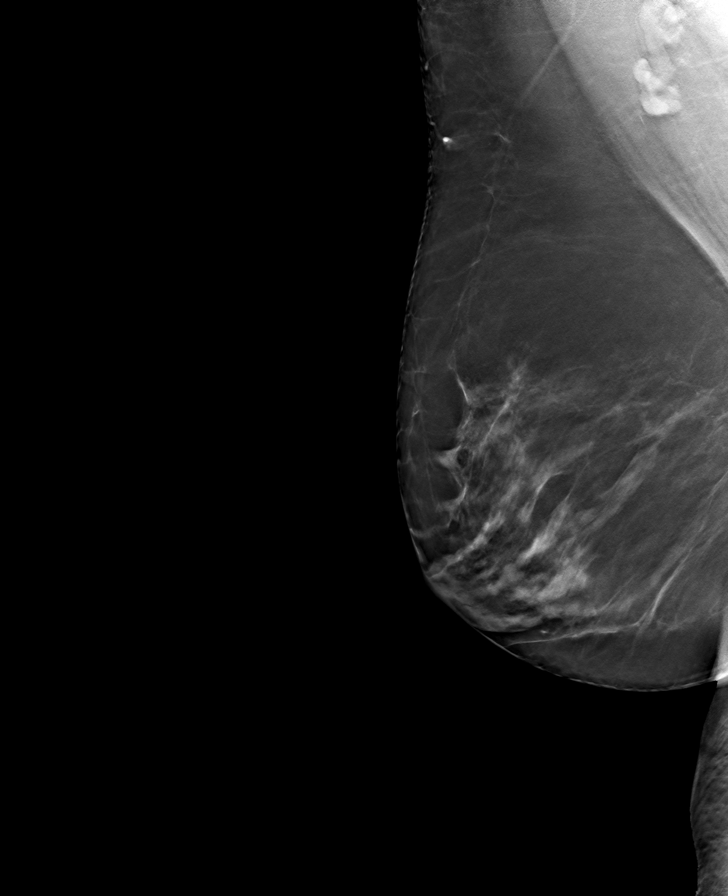

[8 of 24 positions shown; findings below may reference images not displayed]

ACR Breast Density Category b: There are scattered areas of
fibroglandular density.
FINDINGS: There are no findings suspicious for malignancy. Images were
processed with CAD.
IMPRESSION: No mammographic evidence of malignancy. A result letter of this
screening mammogram will be mailed directly to the patient.

RECOMMENDATION:
Screening mammogram in one year. (Code:[id number])

BI-RADS CATEGORY  1: Negative.

## 2019-05-26 ENCOUNTER — Other Ambulatory Visit: Payer: Self-pay | Admitting: Cardiology

## 2019-05-26 NOTE — Telephone Encounter (Signed)
Rx refill sent to pharmacy. 

## 2019-05-27 ENCOUNTER — Telehealth: Payer: Self-pay | Admitting: Pulmonary Disease

## 2019-05-27 DIAGNOSIS — G4733 Obstructive sleep apnea (adult) (pediatric): Secondary | ICD-10-CM

## 2019-05-27 NOTE — Telephone Encounter (Signed)
Called the patient back and confirmed she was still using [facility] and only needed the mask, tubing, etc. No issues with the CPAP machine. Patient last seen in clinic 10/01/18. Sleep test on 05/07/18.  Order placed. Nothing further needed at this time,.

## 2019-06-08 ENCOUNTER — Other Ambulatory Visit: Payer: Self-pay | Admitting: Family Medicine

## 2019-06-08 DIAGNOSIS — Z1231 Encounter for screening mammogram for malignant neoplasm of breast: Secondary | ICD-10-CM

## 2019-06-08 DIAGNOSIS — M8589 Other specified disorders of bone density and structure, multiple sites: Secondary | ICD-10-CM

## 2019-08-16 ENCOUNTER — Other Ambulatory Visit: Payer: Self-pay | Admitting: Cardiology

## 2019-08-18 NOTE — Telephone Encounter (Signed)
Patient overdue for appointment. 15 day supply sent.

## 2019-08-30 ENCOUNTER — Ambulatory Visit
Admission: RE | Admit: 2019-08-30 | Discharge: 2019-08-30 | Disposition: A | Discharge status: Home / Self Care | Payer: Medicare Other | Source: Ambulatory Visit | Attending: Family Medicine | Admitting: Family Medicine

## 2019-08-30 ENCOUNTER — Other Ambulatory Visit: Payer: Self-pay

## 2019-08-30 DIAGNOSIS — M8589 Other specified disorders of bone density and structure, multiple sites: Secondary | ICD-10-CM

## 2019-08-30 DIAGNOSIS — Z1231 Encounter for screening mammogram for malignant neoplasm of breast: Secondary | ICD-10-CM

## 2019-09-18 ENCOUNTER — Other Ambulatory Visit: Payer: Self-pay | Admitting: Family

## 2019-10-13 NOTE — Progress Notes (Signed)
Office Visit Note  Patient: Kaitlyn Hudson             Date of Birth: 25-Mar-1951           MRN: [id number]             PCP: Nonnie Done., MD Referring: Nonnie Done., MD Visit Date: 10/19/2019 Occupation: @GUAROCC @  Subjective:  Fibromyalgia, dry mouth and dry eyes   History of Present Illness: Kaitlyn Hudson is a 69 y.o. female with history of Sjogren's, fibromyalgia and osteoarthritis.  She states she continues to have some pain and stiffness in her hands from osteoarthritis.  She has not noticed any joint swelling.  She also has been having more flares of fibromyalgia with the colder weather.  She has dry mouth and dry eyes for which she has been using over-the-counter products.  Activities of Daily Living:  Patient reports morning stiffness for 30 minutes.   Patient Reports nocturnal pain.  Difficulty dressing/grooming: Denies Difficulty climbing stairs: Denies Difficulty getting out of chair: Denies Difficulty using hands for taps, buttons, cutlery, and/or writing: Reports  Review of Systems  Constitutional: Positive for fatigue. Negative for night sweats, weight gain and weight loss.  HENT: Positive for mouth dryness. Negative for mouth sores, trouble swallowing, trouble swallowing and nose dryness.   Eyes: Positive for dryness. Negative for pain, redness and visual disturbance.  Respiratory: Negative for cough, shortness of breath and difficulty breathing.   Cardiovascular: Negative for chest pain, palpitations, hypertension, irregular heartbeat and swelling in legs/feet.  Gastrointestinal: Negative for blood in stool, constipation and diarrhea.  Endocrine: Positive for excessive thirst. Negative for increased urination.  Genitourinary: Negative for difficulty urinating and vaginal dryness.  Musculoskeletal: Positive for arthralgias, joint pain, morning stiffness and muscle tenderness. Negative for joint swelling, myalgias, muscle weakness and myalgias.  Skin: Negative  for color change, rash, hair loss, skin tightness, ulcers and sensitivity to sunlight.  Allergic/Immunologic: Negative for susceptible to infections.  Neurological: Negative for dizziness, memory loss, night sweats and weakness.  Hematological: Negative for bruising/bleeding tendency and swollen glands.  Psychiatric/Behavioral: Negative for depressed mood and sleep disturbance. The patient is not nervous/anxious.     PMFS History:  Patient Active Problem List   Diagnosis Date Noted  . CAD in native artery 06/26/2018  . Sleep apnea 05/15/2018  . Angina pectoris (HCC) 05/15/2018  . SOB (shortness of breath) on exertion 05/11/2018  . Chest pressure 05/11/2018  . Fibromyalgia 05/11/2018  . Sjogren's syndrome with keratoconjunctivitis sicca (HCC) 04/08/2018  . Former smoker 04/08/2018  . Family history of systemic lupus erythematosus (SLE) in mother 04/08/2018  . Dyslipidemia 04/08/2018  . Primary osteoarthritis of both hands 04/08/2018  . History of depression 03/16/2018  . History of hyperlipidemia 03/16/2018  . Anxiety 04/02/2016  . History of cervical dysplasia 04/02/2016  . Hyperlipidemia 04/02/2016    Past Medical History:  Diagnosis Date  . Allergic rhinitis   . Anxiety 04/02/2016  . Chest pressure 05/11/2018  . Depression   . Dyslipidemia 04/08/2018  . Exertional shortness of breath 05/11/2018  . Family history of systemic lupus erythematosus (SLE) in mother 04/08/2018  . Fibromyalgia   . Former smoker 04/08/2018  . History of cervical dysplasia 04/02/2016   2013; hysterectomy d/t recurrent dysplasia s/p LEEP  . History of depression 03/16/2018  . History of hyperlipidemia 03/16/2018  . Hypercholesteremia 04/02/2016  . Osteoarthritis   . Primary osteoarthritis of both hands 04/08/2018  . Sjogren's syndrome with keratoconjunctivitis sicca (  [place]) 04/08/2018   +ANA, +Ro, +La, sicca symptoms, fatigue  . Trochanteric bursitis, right hip 01/09/2018    Family History  Problem Relation Age of  Onset  . Lupus Mother   . Osteoarthritis Mother   . Polymyalgia rheumatica Mother   . Osteoarthritis Father   . Alzheimer's disease Father   . Breast cancer Maternal Grandmother    Past Surgical History:  Procedure Laterality Date  . HYSTEROTOMY    . KNEE SURGERY Right   . LYMPH NODE BIOPSY     groin    Social History   Social History Narrative  . Not on file   Immunization History  Administered Date(s) Administered  . Influenza, High Dose Seasonal PF 06/24/2018     Objective: Vital Signs: BP 115/61 (BP Location: Left Arm, Patient Position: Sitting, Cuff Size: Normal)   Pulse 69   Resp 16   Ht 5\' 3"  (1.6 m)   Wt 184 lb 6.4 oz (83.6 kg)   BMI 32.66 kg/m    Physical Exam Vitals and nursing note reviewed.  Constitutional:      Appearance: She is well-developed.  HENT:     Head: Normocephalic and atraumatic.  Eyes:     Conjunctiva/sclera: Conjunctivae normal.  Cardiovascular:     Rate and Rhythm: Normal rate and regular rhythm.     Heart sounds: Normal heart sounds.  Pulmonary:     Effort: Pulmonary effort is normal.     Breath sounds: Normal breath sounds.  Abdominal:     General: Bowel sounds are normal.     Palpations: Abdomen is soft.  Musculoskeletal:     Cervical back: Normal range of motion.  Lymphadenopathy:     Cervical: No cervical adenopathy.  Skin:    General: Skin is warm and dry.     Capillary Refill: Capillary refill takes less than 2 seconds.  Neurological:     Mental Status: She is alert and oriented to person, place, and time.  Psychiatric:        Behavior: Behavior normal.      Musculoskeletal Exam: C-spine was in good range of motion.  Shoulder joints, elbow joints, wrist joints, MCPs PIPs and DIPs with good range of motion.  She has DIP and PIP thickening consistent with osteoarthritis.  There was no synovitis.  Hip joints, knee joints, ankles and MTPs and PIPs with good range of motion with no synovitis.  CDAI Exam: CDAI Score: -  Patient Global: -; Provider Global: - Swollen: -; Tender: - Joint Exam 10/19/2019   No joint exam has been documented for this visit   There is currently no information documented on the homunculus. Go to the Rheumatology activity and complete the homunculus joint exam.  Investigation: No additional findings.  Imaging: No results found.  Recent Labs: Lab Results  Component Value Date   WBC 4.1 04/28/2019   HGB 14.4 04/28/2019   PLT 189 04/28/2019   NA 140 04/28/2019   K 4.2 04/28/2019   CL 105 04/28/2019   CO2 24 04/28/2019   GLUCOSE 90 04/28/2019   BUN 18 04/28/2019   CREATININE 1.05 (H) 04/28/2019   BILITOT 0.5 04/28/2019   AST 30 04/28/2019   ALT 6 04/28/2019   PROT 7.7 04/28/2019   PROT 7.6 04/28/2019   CALCIUM 10.6 (H) 04/28/2019   GFRAA 64 04/28/2019    Speciality Comments: No specialty comments available.  Procedures:  No procedures performed Allergies: Codeine   Assessment / Plan:     Visit Diagnoses:  Sjogren's syndrome with keratoconjunctivitis sicca (HCC) - +ANA, +Ro, +La, sicca symptoms, fatigue.  She continues to have sicca symptoms.  She has been using over-the-counter products which were emphasized.  Last labs were reviewed.  She will get labs to her PCP in April which have advised her to forward to me.  Will obtain autoimmune labs again in 6 months.  Primary osteoarthritis of both hands-she has bilateral DIP and PIP thickening.  Joint protection and muscle strengthening was discussed.  A handout on hand exercises was given.  Fibromyalgia-she has been having more flares of fibromyalgia with the colder weather.  Need for regular exercise and good night sleep was discussed.  Osteoporosis screening - DEXA 08/30/2019 which showed T score in the osteopenia range.  Family history of systemic lupus erythematosus (SLE) in mother - AVISE index was negative.  Shortness of breath - Former smoker, April 03, 2018 chest x-ray normal, she was referred to  pulmonologist.  She has been seeing Dr. Wynona Neat who has been treating her for sleep apnea.  Dyslipidemia  History of depression  Former smoker  Orders: No orders of the defined types were placed in this encounter.  No orders of the defined types were placed in this encounter.    Follow-Up Instructions: Return in about 6 months (around 04/17/2020) for Sjogren's, Osteoarthritis.   Pollyann Savoy, MD  Note - This record has been created using Animal nutritionist.  Chart creation errors have been sought, but may not always  have been located. Such creation errors do not reflect on  the standard of medical care.

## 2019-10-19 ENCOUNTER — Encounter: Payer: Self-pay | Admitting: Rheumatology

## 2019-10-19 ENCOUNTER — Ambulatory Visit (INDEPENDENT_AMBULATORY_CARE_PROVIDER_SITE_OTHER): Payer: Medicare Other | Admitting: Rheumatology

## 2019-10-19 ENCOUNTER — Other Ambulatory Visit: Payer: Self-pay

## 2019-10-19 VITALS — BP 115/61 | HR 69 | Resp 16 | Ht 63.0 in | Wt 184.4 lb

## 2019-10-19 DIAGNOSIS — Z87891 Personal history of nicotine dependence: Secondary | ICD-10-CM

## 2019-10-19 DIAGNOSIS — M19041 Primary osteoarthritis, right hand: Secondary | ICD-10-CM | POA: Diagnosis not present

## 2019-10-19 DIAGNOSIS — E785 Hyperlipidemia, unspecified: Secondary | ICD-10-CM

## 2019-10-19 DIAGNOSIS — M797 Fibromyalgia: Secondary | ICD-10-CM | POA: Diagnosis not present

## 2019-10-19 DIAGNOSIS — R0602 Shortness of breath: Secondary | ICD-10-CM

## 2019-10-19 DIAGNOSIS — Z8269 Family history of other diseases of the musculoskeletal system and connective tissue: Secondary | ICD-10-CM

## 2019-10-19 DIAGNOSIS — Z13820 Encounter for screening for osteoporosis: Secondary | ICD-10-CM | POA: Diagnosis not present

## 2019-10-19 DIAGNOSIS — Z8659 Personal history of other mental and behavioral disorders: Secondary | ICD-10-CM

## 2019-10-19 DIAGNOSIS — M19042 Primary osteoarthritis, left hand: Secondary | ICD-10-CM

## 2019-10-19 DIAGNOSIS — M3501 Sicca syndrome with keratoconjunctivitis: Secondary | ICD-10-CM

## 2019-10-19 NOTE — Patient Instructions (Signed)
Hand Exercises Hand exercises can be helpful for almost anyone. These exercises can strengthen the hands, improve flexibility and movement, and increase blood flow to the hands. These results can make work and daily tasks easier. Hand exercises can be especially helpful for people who have joint pain from arthritis or have nerve damage from overuse (carpal tunnel syndrome). These exercises can also help people who have injured a hand. Exercises Most of these hand exercises are gentle stretching and motion exercises. It is usually safe to do them often throughout the day. Warming up your hands before exercise may help to reduce stiffness. You can do this with gentle massage or by placing your hands in warm water for 10-15 minutes. It is normal to feel some stretching, pulling, tightness, or mild discomfort as you begin new exercises. This will gradually improve. Stop an exercise right away if you feel sudden, severe pain or your pain gets worse. Ask your health care provider which exercises are best for you. Knuckle bend or "claw" fist 1. Stand or sit with your arm, hand, and all five fingers pointed straight up. Make sure to keep your wrist straight during the exercise. 2. Gently bend your fingers down toward your palm until the tips of your fingers are touching the top of your palm. Keep your big knuckle straight and just bend the small knuckles in your fingers. 3. Hold this position for __________ seconds. 4. Straighten (extend) your fingers back to the starting position. Repeat this exercise 5-10 times with each hand. Full finger fist 1. Stand or sit with your arm, hand, and all five fingers pointed straight up. Make sure to keep your wrist straight during the exercise. 2. Gently bend your fingers into your palm until the tips of your fingers are touching the middle of your palm. 3. Hold this position for __________ seconds. 4. Extend your fingers back to the starting position, stretching every  joint fully. Repeat this exercise 5-10 times with each hand. Straight fist 1. Stand or sit with your arm, hand, and all five fingers pointed straight up. Make sure to keep your wrist straight during the exercise. 2. Gently bend your fingers at the big knuckle, where your fingers meet your hand, and the middle knuckle. Keep the knuckle at the tips of your fingers straight and try to touch the bottom of your palm. 3. Hold this position for __________ seconds. 4. Extend your fingers back to the starting position, stretching every joint fully. Repeat this exercise 5-10 times with each hand. Tabletop 1. Stand or sit with your arm, hand, and all five fingers pointed straight up. Make sure to keep your wrist straight during the exercise. 2. Gently bend your fingers at the big knuckle, where your fingers meet your hand, as far down as you can while keeping the small knuckles in your fingers straight. Think of forming a tabletop with your fingers. 3. Hold this position for __________ seconds. 4. Extend your fingers back to the starting position, stretching every joint fully. Repeat this exercise 5-10 times with each hand. Finger spread 1. Place your hand flat on a table with your palm facing down. Make sure your wrist stays straight as you do this exercise. 2. Spread your fingers and thumb apart from each other as far as you can until you feel a gentle stretch. Hold this position for __________ seconds. 3. Bring your fingers and thumb tight together again. Hold this position for __________ seconds. Repeat this exercise 5-10 times with each hand.   Making circles 1. Stand or sit with your arm, hand, and all five fingers pointed straight up. Make sure to keep your wrist straight during the exercise. 2. Make a circle by touching the tip of your thumb to the tip of your index finger. 3. Hold for __________ seconds. Then open your hand wide. 4. Repeat this motion with your thumb and each finger on your  hand. Repeat this exercise 5-10 times with each hand. Thumb motion 1. Sit with your forearm resting on a table and your wrist straight. Your thumb should be facing up toward the ceiling. Keep your fingers relaxed as you move your thumb. 2. Lift your thumb up as high as you can toward the ceiling. Hold for __________ seconds. 3. Bend your thumb across your palm as far as you can, reaching the tip of your thumb for the small finger (pinkie) side of your palm. Hold for __________ seconds. Repeat this exercise 5-10 times with each hand. Grip strengthening  1. Hold a stress ball or other soft ball in the middle of your hand. 2. Slowly increase the pressure, squeezing the ball as much as you can without causing pain. Think of bringing the tips of your fingers into the middle of your palm. All of your finger joints should bend when doing this exercise. 3. Hold your squeeze for __________ seconds, then relax. Repeat this exercise 5-10 times with each hand. Contact a health care provider if:  Your hand pain or discomfort gets much worse when you do an exercise.  Your hand pain or discomfort does not improve within 2 hours after you exercise. If you have any of these problems, stop doing these exercises right away. Do not do them again unless your health care provider says that you can. Get help right away if:  You develop sudden, severe hand pain or swelling. If this happens, stop doing these exercises right away. Do not do them again unless your health care provider says that you can. This information is not intended to replace advice given to you by your health care provider. Make sure you discuss any questions you have with your health care provider. Document Revised: 12/10/2018 Document Reviewed: 08/20/2018 Elsevier Patient Education  2020 [employer]  

## 2020-04-06 NOTE — Progress Notes (Signed)
Office Visit Note  Patient: Kaitlyn Hudson             Date of Birth: 17-Mar-1951           MRN: [id number]             PCP: Nonnie Done., MD Referring: Nonnie Done., MD Visit Date: 04/18/2020 Occupation: @GUAROCC @  Subjective:  Dry mouth and dry eyes.   History of Present Illness: Kaitlyn Hudson is a 69 y.o. female with Sjogren's and osteoarthritis.  She states she continues to have dry mouth and dry eyes.  Her pain symptoms are manageable with over-the-counter medications.  She denies any history of joint pain or joint swelling.  She states she was evaluated by pulmonologist for shortness of breath and was diagnosed with sleep apnea.  She denies any shortness of breath now.  She has gained some weight.  She states she is to go for water aerobics on a regular basis and with the pandemic she has not been as active.  Activities of Daily Living:  Patient reports morning stiffness for 30  minutes.   Patient Denies nocturnal pain.  Difficulty dressing/grooming: Denies Difficulty climbing stairs: Denies Difficulty getting out of chair: Denies Difficulty using hands for taps, buttons, cutlery, and/or writing: Reports  Review of Systems  Constitutional: Positive for fatigue. Negative for night sweats, weight gain and weight loss.  HENT: Positive for mouth dryness. Negative for mouth sores, trouble swallowing, trouble swallowing and nose dryness.   Eyes: Positive for dryness. Negative for pain, redness and visual disturbance.  Respiratory: Negative for cough, shortness of breath and difficulty breathing.   Cardiovascular: Negative for chest pain, palpitations, hypertension, irregular heartbeat and swelling in legs/feet.  Gastrointestinal: Negative for blood in stool, constipation and diarrhea.  Endocrine: Negative for increased urination.  Genitourinary: Negative for difficulty urinating and vaginal dryness.  Musculoskeletal: Positive for arthralgias, joint pain, myalgias, morning  stiffness, muscle tenderness and myalgias. Negative for joint swelling and muscle weakness.  Skin: Negative for color change, rash, hair loss, redness, skin tightness, ulcers and sensitivity to sunlight.  Allergic/Immunologic: Negative for susceptible to infections.  Neurological: Negative for dizziness, numbness, headaches, memory loss, night sweats and weakness.  Hematological: Negative for bruising/bleeding tendency and swollen glands.  Psychiatric/Behavioral: Negative for depressed mood, confusion and sleep disturbance. The patient is not nervous/anxious.     PMFS History:  Patient Active Problem List   Diagnosis Date Noted  . CAD in native artery 06/26/2018  . Sleep apnea 05/15/2018  . Angina pectoris (HCC) 05/15/2018  . SOB (shortness of breath) on exertion 05/11/2018  . Chest pressure 05/11/2018  . Fibromyalgia 05/11/2018  . Sjogren's syndrome with keratoconjunctivitis sicca (HCC) 04/08/2018  . Former smoker 04/08/2018  . Family history of systemic lupus erythematosus (SLE) in mother 04/08/2018  . Dyslipidemia 04/08/2018  . Primary osteoarthritis of both hands 04/08/2018  . History of depression 03/16/2018  . History of hyperlipidemia 03/16/2018  . Anxiety 04/02/2016  . History of cervical dysplasia 04/02/2016  . Hyperlipidemia 04/02/2016    Past Medical History:  Diagnosis Date  . Allergic rhinitis   . Anxiety 04/02/2016  . Chest pressure 05/11/2018  . Depression   . Dyslipidemia 04/08/2018  . Exertional shortness of breath 05/11/2018  . Family history of systemic lupus erythematosus (SLE) in mother 04/08/2018  . Fibromyalgia   . Former smoker 04/08/2018  . History of cervical dysplasia 04/02/2016   2013; hysterectomy d/t recurrent dysplasia s/p LEEP  . History of  depression 03/16/2018  . History of hyperlipidemia 03/16/2018  . Hypercholesteremia 04/02/2016  . Osteoarthritis   . Primary osteoarthritis of both hands 04/08/2018  . Sjogren's syndrome with keratoconjunctivitis sicca  (HCC) 04/08/2018   +ANA, +Ro, +La, sicca symptoms, fatigue  . Trochanteric bursitis, right hip 01/09/2018    Family History  Problem Relation Age of Onset  . Lupus Mother   . Osteoarthritis Mother   . Polymyalgia rheumatica Mother   . Osteoarthritis Father   . Alzheimer's disease Father   . Breast cancer Maternal Grandmother    Past Surgical History:  Procedure Laterality Date  . HYSTEROTOMY    . KNEE SURGERY Right   . LYMPH NODE BIOPSY     groin    Social History   Social History Narrative  . Not on file   Immunization History  Administered Date(s) Administered  . Influenza, High Dose Seasonal PF 06/24/2018  . Moderna SARS-COVID-2 Vaccination 09/23/2019, 10/20/2019     Objective: Vital Signs: BP 137/71 (BP Location: Left Arm, Patient Position: Sitting, Cuff Size: Normal)   Pulse 73   Resp 16   Ht 5' 3.5" (1.613 m)   Wt 195 lb 3.2 oz (88.5 kg)   BMI 34.04 kg/m    Physical Exam Vitals and nursing note reviewed.  Constitutional:      Appearance: She is well-developed.  HENT:     Head: Normocephalic and atraumatic.  Eyes:     Conjunctiva/sclera: Conjunctivae normal.  Cardiovascular:     Rate and Rhythm: Normal rate and regular rhythm.     Heart sounds: Normal heart sounds.  Pulmonary:     Effort: Pulmonary effort is normal.     Breath sounds: Normal breath sounds.  Abdominal:     General: Bowel sounds are normal.     Palpations: Abdomen is soft.  Musculoskeletal:     Cervical back: Normal range of motion.  Lymphadenopathy:     Cervical: No cervical adenopathy.  Skin:    General: Skin is warm and dry.     Capillary Refill: Capillary refill takes less than 2 seconds.     Comments: Livedo reticularis was noted.  Neurological:     Mental Status: She is alert and oriented to person, place, and time.  Psychiatric:        Behavior: Behavior normal.      Musculoskeletal Exam: C-spine and lumbar spine with good range of motion.  Shoulder joints, elbow joints,  wrist joints, MCPs PIPs and DIPs with good range of motion.  She has PIP and DIP thickening bilaterally.  Hip joints, knee joints, ankles, MTPs PIPs and DIPs with good range of motion with no synovitis.  CDAI Exam: CDAI Score: -- Patient Global: --; Provider Global: -- Swollen: --; Tender: -- Joint Exam 04/18/2020   No joint exam has been documented for this visit   There is currently no information documented on the homunculus. Go to the Rheumatology activity and complete the homunculus joint exam.  Investigation: No additional findings.  Imaging: No results found.  Recent Labs: Lab Results  Component Value Date   WBC 4.1 04/28/2019   HGB 14.4 04/28/2019   PLT 189 04/28/2019   NA 140 04/28/2019   K 4.2 04/28/2019   CL 105 04/28/2019   CO2 24 04/28/2019   GLUCOSE 90 04/28/2019   BUN 18 04/28/2019   CREATININE 1.05 (H) 04/28/2019   BILITOT 0.5 04/28/2019   AST 30 04/28/2019   ALT 6 04/28/2019   PROT 7.7 04/28/2019  PROT 7.6 04/28/2019   CALCIUM 10.6 (H) 04/28/2019   GFRAA 64 04/28/2019    Speciality Comments: No specialty comments available.  Procedures:  No procedures performed Allergies: Codeine   Assessment / Plan:     Visit Diagnoses: Sjogren's syndrome with keratoconjunctivitis sicca (HCC) - +ANA, +Ro, +La, +RF sicca symptoms, fatigue. -She continues to have sicca symptoms which are manageable with over-the-counter medications.  I will obtain additional labs today.  Plan: CBC with Differential/Platelet, COMPLETE METABOLIC PANEL WITH GFR, Urinalysis, Routine w reflex microscopic, Anti-DNA antibody, double-stranded, C3 and C4, Sedimentation rate, Sjogrens syndrome-A extractable nuclear antibody, Sjogrens syndrome-B extractable nuclear antibody, Rheumatoid factor  Primary osteoarthritis of both hands-joint protection muscle strengthening was discussed.  Fibromyalgia-she continues to have some generalized pain and stiffness.  Osteopenia of multiple sites - DEXA  08/30/2019 which showed T score -1.6.  Use of calcium, vitamin D and resistive exercises were discussed at length.  Family history of systemic lupus erythematosus (SLE) in mother - AVISE index was negative.  Shortness of breath - Former smoker, April 03, 2018 chest x-ray normal, she was referred to pulmonologist.  I reviewed her records.  She was diagnosed with sleep apnea.  History of depression  Dyslipidemia-weight loss diet and exercise was discussed.  Former smoker  Obstructive sleep apnea syndrome  Other fatigue - Plan: Serum protein electrophoresis with reflex  Orders: Orders Placed This Encounter  Procedures  . CBC with Differential/Platelet  . COMPLETE METABOLIC PANEL WITH GFR  . Urinalysis, Routine w reflex microscopic  . Anti-DNA antibody, double-stranded  . C3 and C4  . Sedimentation rate  . Sjogrens syndrome-A extractable nuclear antibody  . Sjogrens syndrome-B extractable nuclear antibody  . Rheumatoid factor  . Serum protein electrophoresis with reflex   No orders of the defined types were placed in this encounter.     Follow-Up Instructions: Return in about 6 months (around 10/19/2020) for Sjogren's, Osteoarthritis.   Pollyann Savoy, MD  Note - This record has been created using Animal nutritionist.  Chart creation errors have been sought, but may not always  have been located. Such creation errors do not reflect on  the standard of medical care.

## 2020-04-18 ENCOUNTER — Encounter: Payer: Self-pay | Admitting: Rheumatology

## 2020-04-18 ENCOUNTER — Other Ambulatory Visit: Payer: Self-pay

## 2020-04-18 ENCOUNTER — Ambulatory Visit (INDEPENDENT_AMBULATORY_CARE_PROVIDER_SITE_OTHER): Payer: Medicare Other | Admitting: Rheumatology

## 2020-04-18 VITALS — BP 137/71 | HR 73 | Resp 16 | Ht 63.5 in | Wt 195.2 lb

## 2020-04-18 DIAGNOSIS — Z87891 Personal history of nicotine dependence: Secondary | ICD-10-CM

## 2020-04-18 DIAGNOSIS — Z8269 Family history of other diseases of the musculoskeletal system and connective tissue: Secondary | ICD-10-CM

## 2020-04-18 DIAGNOSIS — M8589 Other specified disorders of bone density and structure, multiple sites: Secondary | ICD-10-CM | POA: Diagnosis not present

## 2020-04-18 DIAGNOSIS — E785 Hyperlipidemia, unspecified: Secondary | ICD-10-CM

## 2020-04-18 DIAGNOSIS — M3501 Sicca syndrome with keratoconjunctivitis: Secondary | ICD-10-CM | POA: Diagnosis not present

## 2020-04-18 DIAGNOSIS — G4733 Obstructive sleep apnea (adult) (pediatric): Secondary | ICD-10-CM

## 2020-04-18 DIAGNOSIS — M19041 Primary osteoarthritis, right hand: Secondary | ICD-10-CM | POA: Diagnosis not present

## 2020-04-18 DIAGNOSIS — Z8659 Personal history of other mental and behavioral disorders: Secondary | ICD-10-CM

## 2020-04-18 DIAGNOSIS — M797 Fibromyalgia: Secondary | ICD-10-CM

## 2020-04-18 DIAGNOSIS — R5383 Other fatigue: Secondary | ICD-10-CM

## 2020-04-18 DIAGNOSIS — M19042 Primary osteoarthritis, left hand: Secondary | ICD-10-CM

## 2020-04-18 DIAGNOSIS — R0602 Shortness of breath: Secondary | ICD-10-CM

## 2020-04-18 NOTE — Patient Instructions (Signed)
COVID-19 vaccine recommendations:   COVID-19 vaccine is recommended for everyone (unless you are allergic to a vaccine component), even if you are on a medication that suppresses your immune system.   If you are on Methotrexate, Cellcept (mycophenolate), Rinvoq, Xeljanz, and Olumiant- hold the medication for 1 week after each vaccine. Hold Methotrexate for 2 weeks after the single dose COVID-19 vaccine.   If you are on Orencia subcutaneous injection - hold medication one week prior to and one week after the first COVID-19 vaccine dose (only).   If you are on Orencia IV infusions- time vaccination administration so that the first COVID-19 vaccination will occur four weeks after the infusion and postpone the subsequent infusion by one week.   If you are on Cyclophosphamide or Rituxan infusions please contact your doctor prior to receiving the COVID-19 vaccine.   Do not take Tylenol or ant anti-inflammatory medications (NSAIDs) 24 hours prior to the COVID-19 vaccination.   There is no direct evidence about the efficacy of the COVID-19 vaccine in individuals who are on medications that suppress the immune system.   Even if you are fully vaccinated, and you are on any medications that suppress your immune system, please continue to wear a mask, maintain at least six feet social distance and practice hand hygiene.   If you develop a COVID-19 infection, please contact your PCP or our office to determine if you need antibody infusion.  The booster vaccine is now available for immunocompromised patients. It is advised that if you had Pfizer vaccine you should get Pfizer booster.  If you had a Moderna vaccine then you should get a Moderna booster. Johnson and Johnson does not have a booster vaccine at this time.  Please see the following web sites for updated information.    [email or web address]/[email or web address]-[email or web address]-Patient-Resources.[email or web address]  [email or web address]://[email or web address]/[email or web address]  

## 2020-04-20 LAB — COMPLETE METABOLIC PANEL WITH GFR
AG Ratio: 1.4 (calc) (ref 1.0–2.5)
ALT: 8 U/L (ref 6–29)
AST: 26 U/L (ref 10–35)
Albumin: 4.4 g/dL (ref 3.6–5.1)
Alkaline phosphatase (APISO): 48 U/L (ref 37–153)
BUN: 21 mg/dL (ref 7–25)
CO2: 24 mmol/L (ref 20–32)
Calcium: 9.8 mg/dL (ref 8.6–10.4)
Chloride: 105 mmol/L (ref 98–110)
Creat: 0.84 mg/dL (ref 0.50–0.99)
GFR, Est African American: 83 mL/min/{1.73_m2} (ref >=60)
GFR, Est Non African American: 72 mL/min/{1.73_m2} (ref >=60)
Globulin: 3.1 g/dL (calc) (ref 1.9–3.7)
Glucose, Bld: 82 mg/dL (ref 65–99)
Potassium: 4.1 mmol/L (ref 3.5–5.3)
Sodium: 137 mmol/L (ref 135–146)
Total Bilirubin: 0.5 mg/dL (ref 0.2–1.2)
Total Protein: 7.5 g/dL (ref 6.1–8.1)

## 2020-04-20 LAB — CBC WITH DIFFERENTIAL/PLATELET
Absolute Monocytes: 312 cells/uL (ref 200–950)
Basophils Absolute: 60 cells/uL (ref 0–200)
Basophils Relative: 1.5 %
Eosinophils Absolute: 140 cells/uL (ref 15–500)
Eosinophils Relative: 3.5 %
HCT: 40.4 % (ref 35.0–45.0)
Hemoglobin: 13.1 g/dL (ref 11.7–15.5)
Lymphs Abs: 932 cells/uL (ref 850–3900)
MCH: 27.3 pg (ref 27.0–33.0)
MCHC: 32.4 g/dL (ref 32.0–36.0)
MCV: 84.3 fL (ref 80.0–100.0)
MPV: 11 fL (ref 7.5–12.5)
Monocytes Relative: 7.8 %
Neutro Abs: 2556 cells/uL (ref 1500–7800)
Neutrophils Relative %: 63.9 %
Platelets: 167 10*3/uL (ref 140–400)
RBC: 4.79 10*6/uL (ref 3.80–5.10)
RDW: 13.3 % (ref 11.0–15.0)
Total Lymphocyte: 23.3 %
WBC: 4 10*3/uL (ref 3.8–10.8)

## 2020-04-20 LAB — URINALYSIS, ROUTINE W REFLEX MICROSCOPIC
Bilirubin Urine: NEGATIVE
Glucose, UA: NEGATIVE
Hgb urine dipstick: NEGATIVE
Ketones, ur: NEGATIVE
Leukocytes,Ua: NEGATIVE
Nitrite: NEGATIVE
Protein, ur: NEGATIVE
Specific Gravity, Urine: 1.02 (ref 1.001–1.03)
pH: <=5 (ref 5.0–8.0)

## 2020-04-20 LAB — C3 AND C4
C3 Complement: 82 mg/dL — ABNORMAL LOW (ref 83–193)
C4 Complement: 18 mg/dL (ref 15–57)

## 2020-04-20 LAB — SEDIMENTATION RATE: Sed Rate: 6 mm/h (ref 0–30)

## 2020-04-20 LAB — PROTEIN ELECTROPHORESIS, SERUM, WITH REFLEX
Albumin ELP: 4.3 g/dL (ref 3.8–4.8)
Alpha 1: 0.2 g/dL (ref 0.2–0.3)
Alpha 2: 0.6 g/dL (ref 0.5–0.9)
Beta 2: 0.3 g/dL (ref 0.2–0.5)
Beta Globulin: 0.4 g/dL (ref 0.4–0.6)
Gamma Globulin: 1.6 g/dL (ref 0.8–1.7)
Total Protein: 7.4 g/dL (ref 6.1–8.1)

## 2020-04-20 LAB — SJOGRENS SYNDROME-B EXTRACTABLE NUCLEAR ANTIBODY: SSB (La) (ENA) Antibody, IgG: >8 AI — AB

## 2020-04-20 LAB — RHEUMATOID FACTOR: Rhuematoid fact SerPl-aCnc: 25 IU/mL — ABNORMAL HIGH (ref <14)

## 2020-04-20 LAB — ANTI-DNA ANTIBODY, DOUBLE-STRANDED: ds DNA Ab: 3 IU/mL

## 2020-04-20 LAB — SJOGRENS SYNDROME-A EXTRACTABLE NUCLEAR ANTIBODY: SSA (Ro) (ENA) Antibody, IgG: >8 AI — AB

## 2020-04-20 NOTE — Progress Notes (Signed)
Antibody for Sjogren's which include SSA and SSB are positive.  Rheumatoid factor is low positive.  SPEP is normal.  C3 is mildly decreased.  No change in therapy is advised.

## 2020-10-03 NOTE — Progress Notes (Deleted)
Office Visit Note  Patient: Kaitlyn Hudson             Date of Birth: 1951-01-30           MRN: [id number]             PCP: Nonnie Done., MD Referring: Nonnie Done., MD Visit Date: 10/17/2020 Occupation: @GUAROCC @  Subjective:  No chief complaint on file.   History of Present Illness: Kaitlyn Hudson is a 70 y.o. female ***   Activities of Daily Living:  Patient reports morning stiffness for *** {minute/hour:19697}.   Patient {ACTIONS;DENIES/REPORTS:21021675::"Denies"} nocturnal pain.  Difficulty dressing/grooming: {ACTIONS;DENIES/REPORTS:21021675::"Denies"} Difficulty climbing stairs: {ACTIONS;DENIES/REPORTS:21021675::"Denies"} Difficulty getting out of chair: {ACTIONS;DENIES/REPORTS:21021675::"Denies"} Difficulty using hands for taps, buttons, cutlery, and/or writing: {ACTIONS;DENIES/REPORTS:21021675::"Denies"}  No Rheumatology ROS completed.   PMFS History:  Patient Active Problem List   Diagnosis Date Noted  . CAD in native artery 06/26/2018  . Sleep apnea 05/15/2018  . Angina pectoris (HCC) 05/15/2018  . SOB (shortness of breath) on exertion 05/11/2018  . Chest pressure 05/11/2018  . Fibromyalgia 05/11/2018  . Sjogren's syndrome with keratoconjunctivitis sicca (HCC) 04/08/2018  . Former smoker 04/08/2018  . Family history of systemic lupus erythematosus (SLE) in mother 04/08/2018  . Dyslipidemia 04/08/2018  . Primary osteoarthritis of both hands 04/08/2018  . History of depression 03/16/2018  . History of hyperlipidemia 03/16/2018  . Anxiety 04/02/2016  . History of cervical dysplasia 04/02/2016  . Hyperlipidemia 04/02/2016    Past Medical History:  Diagnosis Date  . Allergic rhinitis   . Anxiety 04/02/2016  . Chest pressure 05/11/2018  . Depression   . Dyslipidemia 04/08/2018  . Exertional shortness of breath 05/11/2018  . Family history of systemic lupus erythematosus (SLE) in mother 04/08/2018  . Fibromyalgia   . Former smoker 04/08/2018  . History of  cervical dysplasia 04/02/2016   2013; hysterectomy d/t recurrent dysplasia s/p LEEP  . History of depression 03/16/2018  . History of hyperlipidemia 03/16/2018  . Hypercholesteremia 04/02/2016  . Osteoarthritis   . Primary osteoarthritis of both hands 04/08/2018  . Sjogren's syndrome with keratoconjunctivitis sicca (HCC) 04/08/2018   +ANA, +Ro, +La, sicca symptoms, fatigue  . Trochanteric bursitis, right hip 01/09/2018    Family History  Problem Relation Age of Onset  . Lupus Mother   . Osteoarthritis Mother   . Polymyalgia rheumatica Mother   . Osteoarthritis Father   . Alzheimer's disease Father   . Breast cancer Maternal Grandmother    Past Surgical History:  Procedure Laterality Date  . HYSTEROTOMY    . KNEE SURGERY Right   . LYMPH NODE BIOPSY     groin    Social History   Social History Narrative  . Not on file   Immunization History  Administered Date(s) Administered  . Influenza, High Dose Seasonal PF 06/24/2018  . Moderna Sars-Covid-2 Vaccination 09/23/2019, 10/20/2019     Objective: Vital Signs: There were no vitals taken for this visit.   Physical Exam   Musculoskeletal Exam: ***  CDAI Exam: CDAI Score: -- Patient Global: --; Provider Global: -- Swollen: --; Tender: -- Joint Exam 10/17/2020   No joint exam has been documented for this visit   There is currently no information documented on the homunculus. Go to the Rheumatology activity and complete the homunculus joint exam.  Investigation: No additional findings.  Imaging: No results found.  Recent Labs: Lab Results  Component Value Date   WBC 4.0 04/18/2020   HGB 13.1 04/18/2020   PLT  167 04/18/2020   NA 137 04/18/2020   K 4.1 04/18/2020   CL 105 04/18/2020   CO2 24 04/18/2020   GLUCOSE 82 04/18/2020   BUN 21 04/18/2020   CREATININE 0.84 04/18/2020   BILITOT 0.5 04/18/2020   AST 26 04/18/2020   ALT 8 04/18/2020   PROT 7.5 04/18/2020   PROT 7.4 04/18/2020   CALCIUM 9.8 04/18/2020    GFRAA 83 04/18/2020    Speciality Comments: No specialty comments available.  Procedures:  No procedures performed Allergies: Codeine   Assessment / Plan:     Visit Diagnoses: No diagnosis found.  Orders: No orders of the defined types were placed in this encounter.  No orders of the defined types were placed in this encounter.   Face-to-face time spent with patient was *** minutes. Greater than 50% of time was spent in counseling and coordination of care.  Follow-Up Instructions: No follow-ups on file.   Ellen Henri, CMA  Note - This record has been created using Animal nutritionist.  Chart creation errors have been sought, but may not always  have been located. Such creation errors do not reflect on  the standard of medical care.

## 2020-10-16 NOTE — Progress Notes (Signed)
Office Visit Note  Patient: Kaitlyn Hudson             Date of Birth: Sep 24, 1950           MRN: [id number]             PCP: Nonnie Done., MD Referring: Nonnie Done., MD Visit Date: 10/24/2020 Occupation: @GUAROCC @  Subjective:  Sicca symptoms   History of Present Illness: Kaitlyn Hudson is a 70 y.o. female with history of Sjogren's syndrome and osteoarthritis.  Patient is not taking any immunosuppressive agents at this time.  She has ongoing sicca symptoms which have been tolerable overall.  She has tried to increase her fluid intake and has been using Restasis as well as over-the-counter eyedrops for symptomatic relief.  She had a recent appointment with her ophthalmologist and had a dental exam yesterday.  She denies any recent dental caries.  She denies any parotid tenderness or swelling.  She denies any swollen lymph nodes.  She continues to have chronic pain in both hands but denies any joint swelling.  She has ongoing tender points due to underlying fibromyalgia.  She denies any muscle weakness.    Activities of Daily Living:  Patient reports morning stiffness for 30 minutes.   Patient Denies nocturnal pain.  Difficulty dressing/grooming: Denies Difficulty climbing stairs: Reports Difficulty getting out of chair: Denies Difficulty using hands for taps, buttons, cutlery, and/or writing: Reports  Review of Systems  Constitutional: Positive for fatigue.  HENT: Positive for mouth dryness and nose dryness. Negative for mouth sores.   Eyes: Positive for dryness. Negative for pain and itching.  Respiratory: Negative for shortness of breath and difficulty breathing.   Cardiovascular: Negative for chest pain and palpitations.  Gastrointestinal: Negative for blood in stool, constipation and diarrhea.  Endocrine: Negative for increased urination.  Genitourinary: Negative for difficulty urinating.  Musculoskeletal: Positive for arthralgias, joint pain, myalgias, morning stiffness,  muscle tenderness and myalgias. Negative for joint swelling.  Skin: Negative for color change, rash and redness.  Allergic/Immunologic: Negative for susceptible to infections.  Neurological: Negative for dizziness, numbness, headaches, memory loss and weakness.  Hematological: Negative for bruising/bleeding tendency.  Psychiatric/Behavioral: Negative for confusion.    PMFS History:  Patient Active Problem List   Diagnosis Date Noted  . CAD in native artery 06/26/2018  . Sleep apnea 05/15/2018  . Angina pectoris (HCC) 05/15/2018  . SOB (shortness of breath) on exertion 05/11/2018  . Chest pressure 05/11/2018  . Fibromyalgia 05/11/2018  . Sjogren's syndrome with keratoconjunctivitis sicca (HCC) 04/08/2018  . Former smoker 04/08/2018  . Family history of systemic lupus erythematosus (SLE) in mother 04/08/2018  . Dyslipidemia 04/08/2018  . Primary osteoarthritis of both hands 04/08/2018  . History of depression 03/16/2018  . History of hyperlipidemia 03/16/2018  . Anxiety 04/02/2016  . History of cervical dysplasia 04/02/2016  . Hyperlipidemia 04/02/2016    Past Medical History:  Diagnosis Date  . Allergic rhinitis   . Anxiety 04/02/2016  . Chest pressure 05/11/2018  . Depression   . Dyslipidemia 04/08/2018  . Exertional shortness of breath 05/11/2018  . Family history of systemic lupus erythematosus (SLE) in mother 04/08/2018  . Fibromyalgia   . Former smoker 04/08/2018  . History of cervical dysplasia 04/02/2016   2013; hysterectomy d/t recurrent dysplasia s/p LEEP  . History of depression 03/16/2018  . History of hyperlipidemia 03/16/2018  . Hypercholesteremia 04/02/2016  . Osteoarthritis   . Primary osteoarthritis of both hands 04/08/2018  . Sjogren's  syndrome with keratoconjunctivitis sicca (HCC) 04/08/2018   +ANA, +Ro, +La, sicca symptoms, fatigue  . Trochanteric bursitis, right hip 01/09/2018    Family History  Problem Relation Age of Onset  . Lupus Mother   . Osteoarthritis Mother    . Polymyalgia rheumatica Mother   . Osteoarthritis Father   . Alzheimer's disease Father   . Breast cancer Maternal Grandmother    Past Surgical History:  Procedure Laterality Date  . HYSTEROTOMY    . KNEE SURGERY Right   . LYMPH NODE BIOPSY     groin    Social History   Social History Narrative  . Not on file   Immunization History  Administered Date(s) Administered  . Influenza, High Dose Seasonal PF 06/24/2018  . Moderna Sars-Covid-2 Vaccination 09/23/2019, 10/20/2019, 06/30/2020     Objective: Vital Signs: BP 117/73 (BP Location: Left Arm, Patient Position: Sitting, Cuff Size: Normal)   Pulse 78   Resp 14   Ht 5' 3.5" (1.613 m)   Wt 193 lb (87.5 kg)   BMI 33.65 kg/m    Physical Exam Vitals and nursing note reviewed.  Constitutional:      Appearance: She is well-developed and well-nourished.  HENT:     Head: Normocephalic and atraumatic.     Comments: No parotid tenderness or swelling  Eyes:     Extraocular Movements: EOM normal.     Conjunctiva/sclera: Conjunctivae normal.  Cardiovascular:     Rate and Rhythm: Regular rhythm.     Pulses: Intact distal pulses.  Pulmonary:     Effort: Pulmonary effort is normal.  Abdominal:     Palpations: Abdomen is soft.  Musculoskeletal:     Cervical back: Normal range of motion.  Lymphadenopathy:     Cervical: No cervical adenopathy.  Skin:    General: Skin is warm and dry.     Capillary Refill: Capillary refill takes less than 2 seconds.  Neurological:     Mental Status: She is alert and oriented to person, place, and time.  Psychiatric:        Mood and Affect: Mood and affect normal.        Behavior: Behavior normal.      Musculoskeletal Exam: C-spine, thoracic spine, and lumbar spine good ROM.  Shoulder joints, elbow joints, wrist joints, MCPs, PIPs, and DIPs good ROM with no synovitis.  DIP thickening consistent with osteoarthritis of both hands.  Tenderness over the right CMC joint.  Complete fist formation  bilaterally. Hip joints good ROM with no discomfort.  Tenderness over the right trochanteric bursa.  Knee joints good ROM with no warmth or effusion. Knee crepitus bilaterally.  Ankle joints good ROM with no discomfort.   CDAI Exam: CDAI Score: - Patient Global: -; Provider Global: - Swollen: -; Tender: - Joint Exam 10/24/2020   No joint exam has been documented for this visit   There is currently no information documented on the homunculus. Go to the Rheumatology activity and complete the homunculus joint exam.  Investigation: No additional findings.  Imaging: No results found.  Recent Labs: Lab Results  Component Value Date   WBC 4.0 04/18/2020   HGB 13.1 04/18/2020   PLT 167 04/18/2020   NA 137 04/18/2020   K 4.1 04/18/2020   CL 105 04/18/2020   CO2 24 04/18/2020   GLUCOSE 82 04/18/2020   BUN 21 04/18/2020   CREATININE 0.84 04/18/2020   BILITOT 0.5 04/18/2020   AST 26 04/18/2020   ALT 8 04/18/2020   PROT  7.5 04/18/2020   PROT 7.4 04/18/2020   CALCIUM 9.8 04/18/2020   GFRAA 83 04/18/2020    Speciality Comments: No specialty comments available.  Procedures:  No procedures performed Allergies: Codeine   Assessment / Plan:     Visit Diagnoses: Sjogren's syndrome with keratoconjunctivitis sicca (HCC) - +ANA, +Ro, +La, +RF, sicca symptoms, fatigue: She has ongoing sicca symptoms which have been tolerable overall.  She had a recent appointment with her ophthalmologist and continues to use Restasis as well as over-the-counter eyedrops for symptomatic relief.  She has tried increasing her fluid intake to help with mouth dryness.  She had a dental exam yesterday and did not have any dental caries.  We discussed the importance of good oral hygiene.  We discussed trying Biotene products or XyliMelts over-the-counter for symptomatic relief.  She has no parotid swelling or tenderness on examination today.  No cervical lymphadenopathy was palpable.  She has no signs of inflammatory  arthritis at this time.  No synovitis was noted on examination today.  She does not require immunosuppressive therapy at this time.  We will repeat the following lab work today.  She was advised to notify us if she develops any new or worsening symptoms.  She will follow-up in the office in 5 months.- Plan: Urinalysis, Routine w reflex microscopic, COMPLETE METABOLIC PANEL WITH GFR, CBC with Differential/Platelet, C3 and C4, Sedimentation rate  Primary osteoarthritis of both hands: She has PIP and DIP thickening consistent with osteoarthritis of both hands.  She has tenderness palpation over the right CMC joint.  We discussed the importance of joint protection and muscle strengthening.  She was given a handout of hand exercises to perform.  She was also given a prescription for a right CMC joint brace to try.  We also discussed the use of arthritis compression gloves.  Fibromyalgia: She has positive tender points on examination today.  Her discomfort is most severe over the deltoid insertion site bilaterally and on the medial aspect of both knees.  She has ongoing trapezius muscle tension and muscle tenderness bilaterally.  Discussed trying a heat pad or heated blanket.  Discussed the importance of regular exercise and good sleep hygiene.   Osteopenia of multiple sites - DEXA 08/30/2019 which showed T score -1.6.  We discussed the importance of taking a calcium and vitamin D supplement.  She was given a handout of information about calcium rich foods which she can obtain through her diet.  We also discussed performing resistive exercises on a daily basis.   Other medical conditions are listed as follows:   Family history of systemic lupus erythematosus (SLE) in mother - AVISE index negative   History of depression  Dyslipidemia  Obstructive sleep apnea syndrome  Former smoker  Orders: Orders Placed This Encounter  Procedures  . Urinalysis, Routine w reflex microscopic  . COMPLETE METABOLIC  PANEL WITH GFR  . CBC with Differential/Platelet  . C3 and C4  . Sedimentation rate   No orders of the defined types were placed in this encounter.     Follow-Up Instructions: Return in about 5 months (around 03/23/2021) for Sjogren's syndrome, Osteoarthritis.   Gearldine Bienenstock, PA-C  Note - This record has been created using Dragon software.  Chart creation errors have been sought, but may not always  have been located. Such creation errors do not reflect on  the standard of medical care.

## 2020-10-17 ENCOUNTER — Ambulatory Visit: Payer: Medicare Other | Admitting: Rheumatology

## 2020-10-17 DIAGNOSIS — M19041 Primary osteoarthritis, right hand: Secondary | ICD-10-CM

## 2020-10-17 DIAGNOSIS — R5383 Other fatigue: Secondary | ICD-10-CM

## 2020-10-17 DIAGNOSIS — Z87891 Personal history of nicotine dependence: Secondary | ICD-10-CM

## 2020-10-17 DIAGNOSIS — J343 Hypertrophy of nasal turbinates: Secondary | ICD-10-CM | POA: Diagnosis not present

## 2020-10-17 DIAGNOSIS — Z8659 Personal history of other mental and behavioral disorders: Secondary | ICD-10-CM

## 2020-10-17 DIAGNOSIS — E785 Hyperlipidemia, unspecified: Secondary | ICD-10-CM

## 2020-10-17 DIAGNOSIS — J310 Chronic rhinitis: Secondary | ICD-10-CM | POA: Diagnosis not present

## 2020-10-17 DIAGNOSIS — M797 Fibromyalgia: Secondary | ICD-10-CM

## 2020-10-17 DIAGNOSIS — J342 Deviated nasal septum: Secondary | ICD-10-CM | POA: Diagnosis not present

## 2020-10-17 DIAGNOSIS — R0602 Shortness of breath: Secondary | ICD-10-CM

## 2020-10-17 DIAGNOSIS — M3501 Sicca syndrome with keratoconjunctivitis: Secondary | ICD-10-CM

## 2020-10-17 DIAGNOSIS — R519 Headache, unspecified: Secondary | ICD-10-CM | POA: Diagnosis not present

## 2020-10-17 DIAGNOSIS — G4733 Obstructive sleep apnea (adult) (pediatric): Secondary | ICD-10-CM

## 2020-10-17 DIAGNOSIS — J329 Chronic sinusitis, unspecified: Secondary | ICD-10-CM | POA: Diagnosis not present

## 2020-10-17 DIAGNOSIS — Z87898 Personal history of other specified conditions: Secondary | ICD-10-CM | POA: Diagnosis not present

## 2020-10-17 DIAGNOSIS — Z8269 Family history of other diseases of the musculoskeletal system and connective tissue: Secondary | ICD-10-CM

## 2020-10-17 DIAGNOSIS — R0981 Nasal congestion: Secondary | ICD-10-CM | POA: Diagnosis not present

## 2020-10-17 DIAGNOSIS — J3489 Other specified disorders of nose and nasal sinuses: Secondary | ICD-10-CM | POA: Diagnosis not present

## 2020-10-17 DIAGNOSIS — M8589 Other specified disorders of bone density and structure, multiple sites: Secondary | ICD-10-CM

## 2020-10-24 ENCOUNTER — Ambulatory Visit: Payer: PPO | Admitting: Physician Assistant

## 2020-10-24 ENCOUNTER — Other Ambulatory Visit: Payer: Self-pay

## 2020-10-24 ENCOUNTER — Encounter: Payer: Self-pay | Admitting: Physician Assistant

## 2020-10-24 VITALS — BP 117/73 | HR 78 | Resp 14 | Ht 63.5 in | Wt 193.0 lb

## 2020-10-24 DIAGNOSIS — Z8659 Personal history of other mental and behavioral disorders: Secondary | ICD-10-CM | POA: Diagnosis not present

## 2020-10-24 DIAGNOSIS — M19042 Primary osteoarthritis, left hand: Secondary | ICD-10-CM | POA: Diagnosis not present

## 2020-10-24 DIAGNOSIS — M8589 Other specified disorders of bone density and structure, multiple sites: Secondary | ICD-10-CM

## 2020-10-24 DIAGNOSIS — E785 Hyperlipidemia, unspecified: Secondary | ICD-10-CM

## 2020-10-24 DIAGNOSIS — G4733 Obstructive sleep apnea (adult) (pediatric): Secondary | ICD-10-CM

## 2020-10-24 DIAGNOSIS — M797 Fibromyalgia: Secondary | ICD-10-CM | POA: Diagnosis not present

## 2020-10-24 DIAGNOSIS — M3501 Sicca syndrome with keratoconjunctivitis: Secondary | ICD-10-CM

## 2020-10-24 DIAGNOSIS — Z87891 Personal history of nicotine dependence: Secondary | ICD-10-CM | POA: Diagnosis not present

## 2020-10-24 DIAGNOSIS — Z8269 Family history of other diseases of the musculoskeletal system and connective tissue: Secondary | ICD-10-CM

## 2020-10-24 DIAGNOSIS — M19041 Primary osteoarthritis, right hand: Secondary | ICD-10-CM | POA: Diagnosis not present

## 2020-10-24 LAB — CBC WITH DIFFERENTIAL/PLATELET
Absolute Monocytes: 318 cells/uL (ref 200–950)
Basophils Absolute: 59 cells/uL (ref 0–200)
HCT: 40.8 % (ref 35.0–45.0)

## 2020-10-24 LAB — COMPLETE METABOLIC PANEL WITH GFR
Albumin: 4.4 g/dL (ref 3.6–5.1)
BUN: 18 mg/dL (ref 7–25)
Calcium: 9.9 mg/dL (ref 8.6–10.4)
Total Protein: 7.9 g/dL (ref 6.1–8.1)

## 2020-10-24 NOTE — Patient Instructions (Signed)
Hand Exercises Hand exercises can be helpful for almost anyone. These exercises can strengthen the hands, improve flexibility and movement, and increase blood flow to the hands. These results can make work and daily tasks easier. Hand exercises can be especially helpful for people who have joint pain from arthritis or have nerve damage from overuse (carpal tunnel syndrome). These exercises can also help people who have injured a hand. Exercises Most of these hand exercises are gentle stretching and motion exercises. It is usually safe to do them often throughout the day. Warming up your hands before exercise may help to reduce stiffness. You can do this with gentle massage or by placing your hands in warm water for 10-15 minutes. It is normal to feel some stretching, pulling, tightness, or mild discomfort as you begin new exercises. This will gradually improve. Stop an exercise right away if you feel sudden, severe pain or your pain gets worse. Ask your health care provider which exercises are best for you. Knuckle bend or "claw" fist 1. Stand or sit with your arm, hand, and all five fingers pointed straight up. Make sure to keep your wrist straight during the exercise. 2. Gently bend your fingers down toward your palm until the tips of your fingers are touching the top of your palm. Keep your big knuckle straight and just bend the small knuckles in your fingers. 3. Hold this position for __________ seconds. 4. Straighten (extend) your fingers back to the starting position. Repeat this exercise 5-10 times with each hand. Full finger fist 1. Stand or sit with your arm, hand, and all five fingers pointed straight up. Make sure to keep your wrist straight during the exercise. 2. Gently bend your fingers into your palm until the tips of your fingers are touching the middle of your palm. 3. Hold this position for __________ seconds. 4. Extend your fingers back to the starting position, stretching every  joint fully. Repeat this exercise 5-10 times with each hand. Straight fist 1. Stand or sit with your arm, hand, and all five fingers pointed straight up. Make sure to keep your wrist straight during the exercise. 2. Gently bend your fingers at the big knuckle, where your fingers meet your hand, and the middle knuckle. Keep the knuckle at the tips of your fingers straight and try to touch the bottom of your palm. 3. Hold this position for __________ seconds. 4. Extend your fingers back to the starting position, stretching every joint fully. Repeat this exercise 5-10 times with each hand. Tabletop 1. Stand or sit with your arm, hand, and all five fingers pointed straight up. Make sure to keep your wrist straight during the exercise. 2. Gently bend your fingers at the big knuckle, where your fingers meet your hand, as far down as you can while keeping the small knuckles in your fingers straight. Think of forming a tabletop with your fingers. 3. Hold this position for __________ seconds. 4. Extend your fingers back to the starting position, stretching every joint fully. Repeat this exercise 5-10 times with each hand. Finger spread 1. Place your hand flat on a table with your palm facing down. Make sure your wrist stays straight as you do this exercise. 2. Spread your fingers and thumb apart from each other as far as you can until you feel a gentle stretch. Hold this position for __________ seconds. 3. Bring your fingers and thumb tight together again. Hold this position for __________ seconds. Repeat this exercise 5-10 times with each hand.   Making circles 1. Stand or sit with your arm, hand, and all five fingers pointed straight up. Make sure to keep your wrist straight during the exercise. 2. Make a circle by touching the tip of your thumb to the tip of your index finger. 3. Hold for __________ seconds. Then open your hand wide. 4. Repeat this motion with your thumb and each finger on your  hand. Repeat this exercise 5-10 times with each hand. Thumb motion 1. Sit with your forearm resting on a table and your wrist straight. Your thumb should be facing up toward the ceiling. Keep your fingers relaxed as you move your thumb. 2. Lift your thumb up as high as you can toward the ceiling. Hold for __________ seconds. 3. Bend your thumb across your palm as far as you can, reaching the tip of your thumb for the small finger (pinkie) side of your palm. Hold for __________ seconds. Repeat this exercise 5-10 times with each hand. Grip strengthening 1. Hold a stress ball or other soft ball in the middle of your hand. 2. Slowly increase the pressure, squeezing the ball as much as you can without causing pain. Think of bringing the tips of your fingers into the middle of your palm. All of your finger joints should bend when doing this exercise. 3. Hold your squeeze for __________ seconds, then relax. Repeat this exercise 5-10 times with each hand.   Contact a health care provider if:  Your hand pain or discomfort gets much worse when you do an exercise.  Your hand pain or discomfort does not improve within 2 hours after you exercise. If you have any of these problems, stop doing these exercises right away. Do not do them again unless your health care provider says that you can. Get help right away if:  You develop sudden, severe hand pain or swelling. If this happens, stop doing these exercises right away. Do not do them again unless your health care provider says that you can. This information is not intended to replace advice given to you by your health care provider. Make sure you discuss any questions you have with your health care provider. Document Revised: 12/10/2018 Document Reviewed: 08/20/2018 Elsevier Patient Education  2021 [employer]  

## 2020-10-25 ENCOUNTER — Telehealth: Payer: Self-pay | Admitting: *Deleted

## 2020-10-25 DIAGNOSIS — M3501 Sicca syndrome with keratoconjunctivitis: Secondary | ICD-10-CM

## 2020-10-25 LAB — CBC WITH DIFFERENTIAL/PLATELET
Basophils Relative: 1.6 %
Eosinophils Absolute: 189 cells/uL (ref 15–500)
Eosinophils Relative: 5.1 %
Hemoglobin: 13.2 g/dL (ref 11.7–15.5)
Lymphs Abs: 1103 cells/uL (ref 850–3900)
MCH: 26.4 pg — ABNORMAL LOW (ref 27.0–33.0)
MCHC: 32.4 g/dL (ref 32.0–36.0)
MCV: 81.6 fL (ref 80.0–100.0)
MPV: 11 fL (ref 7.5–12.5)
Monocytes Relative: 8.6 %
Neutro Abs: 2031 cells/uL (ref 1500–7800)
Neutrophils Relative %: 54.9 %
Platelets: 202 10*3/uL (ref 140–400)
RBC: 5 10*6/uL (ref 3.80–5.10)
RDW: 14.3 % (ref 11.0–15.0)
Total Lymphocyte: 29.8 %
WBC: 3.7 10*3/uL — ABNORMAL LOW (ref 3.8–10.8)

## 2020-10-25 LAB — URINALYSIS, ROUTINE W REFLEX MICROSCOPIC
Bilirubin Urine: NEGATIVE
Glucose, UA: NEGATIVE
Hgb urine dipstick: NEGATIVE
Ketones, ur: NEGATIVE
Leukocytes,Ua: NEGATIVE
Nitrite: NEGATIVE
Protein, ur: NEGATIVE
Specific Gravity, Urine: 1.015 (ref 1.001–1.03)
pH: 6 (ref 5.0–8.0)

## 2020-10-25 LAB — COMPLETE METABOLIC PANEL WITH GFR
AG Ratio: 1.3 (calc) (ref 1.0–2.5)
ALT: 6 U/L (ref 6–29)
AST: 24 U/L (ref 10–35)
Alkaline phosphatase (APISO): 58 U/L (ref 37–153)
CO2: 27 mmol/L (ref 20–32)
Chloride: 102 mmol/L (ref 98–110)
Creat: 0.86 mg/dL (ref 0.50–0.99)
GFR, Est African American: 80 mL/min/{1.73_m2} (ref >=60)
GFR, Est Non African American: 69 mL/min/{1.73_m2} (ref >=60)
Globulin: 3.5 g/dL (calc) (ref 1.9–3.7)
Glucose, Bld: 104 mg/dL — ABNORMAL HIGH (ref 65–99)
Potassium: 4.2 mmol/L (ref 3.5–5.3)
Sodium: 137 mmol/L (ref 135–146)
Total Bilirubin: 0.5 mg/dL (ref 0.2–1.2)

## 2020-10-25 LAB — SEDIMENTATION RATE: Sed Rate: 19 mm/h (ref 0–30)

## 2020-10-25 LAB — C3 AND C4
C3 Complement: 119 mg/dL (ref 83–193)
C4 Complement: 27 mg/dL (ref 15–57)

## 2020-10-25 NOTE — Telephone Encounter (Signed)
-----  Message from Ofilia Neas, PA-C sent at 10/25/2020  8:13 AM EST ----- WBC count is borderline low-3.7. CMP WNL.  ESR WNL.  Complements WNL.  UA normal.  Recommend rechecking CBC with diff in 1 month.

## 2020-10-25 NOTE — Progress Notes (Signed)
WBC count is borderline low-3.7. CMP WNL.  ESR WNL.  Complements WNL.  UA normal.  Recommend rechecking CBC with diff in 1 month.

## 2020-11-01 DIAGNOSIS — J3489 Other specified disorders of nose and nasal sinuses: Secondary | ICD-10-CM | POA: Diagnosis not present

## 2020-11-01 DIAGNOSIS — J321 Chronic frontal sinusitis: Secondary | ICD-10-CM | POA: Diagnosis not present

## 2020-11-01 DIAGNOSIS — J0110 Acute frontal sinusitis, unspecified: Secondary | ICD-10-CM | POA: Diagnosis not present

## 2020-11-01 DIAGNOSIS — J329 Chronic sinusitis, unspecified: Secondary | ICD-10-CM | POA: Diagnosis not present

## 2020-11-07 DIAGNOSIS — J343 Hypertrophy of nasal turbinates: Secondary | ICD-10-CM | POA: Diagnosis not present

## 2020-11-07 DIAGNOSIS — J310 Chronic rhinitis: Secondary | ICD-10-CM | POA: Diagnosis not present

## 2020-11-07 DIAGNOSIS — R0981 Nasal congestion: Secondary | ICD-10-CM | POA: Diagnosis not present

## 2020-11-07 DIAGNOSIS — E785 Hyperlipidemia, unspecified: Secondary | ICD-10-CM | POA: Diagnosis not present

## 2020-11-07 DIAGNOSIS — Z79899 Other long term (current) drug therapy: Secondary | ICD-10-CM | POA: Diagnosis not present

## 2020-11-07 DIAGNOSIS — J3489 Other specified disorders of nose and nasal sinuses: Secondary | ICD-10-CM | POA: Diagnosis not present

## 2020-11-07 DIAGNOSIS — J342 Deviated nasal septum: Secondary | ICD-10-CM | POA: Diagnosis not present

## 2020-11-07 DIAGNOSIS — Z7982 Long term (current) use of aspirin: Secondary | ICD-10-CM | POA: Diagnosis not present

## 2020-11-07 DIAGNOSIS — R519 Headache, unspecified: Secondary | ICD-10-CM | POA: Diagnosis not present

## 2020-11-07 DIAGNOSIS — E7801 Familial hypercholesterolemia: Secondary | ICD-10-CM | POA: Diagnosis not present

## 2020-11-07 DIAGNOSIS — F329 Major depressive disorder, single episode, unspecified: Secondary | ICD-10-CM | POA: Diagnosis not present

## 2020-11-30 DIAGNOSIS — R519 Headache, unspecified: Secondary | ICD-10-CM | POA: Diagnosis not present

## 2020-11-30 DIAGNOSIS — R0981 Nasal congestion: Secondary | ICD-10-CM | POA: Diagnosis not present

## 2020-11-30 DIAGNOSIS — J342 Deviated nasal septum: Secondary | ICD-10-CM | POA: Diagnosis not present

## 2020-11-30 DIAGNOSIS — Z7982 Long term (current) use of aspirin: Secondary | ICD-10-CM | POA: Diagnosis not present

## 2020-11-30 DIAGNOSIS — E785 Hyperlipidemia, unspecified: Secondary | ICD-10-CM | POA: Diagnosis not present

## 2020-11-30 DIAGNOSIS — J343 Hypertrophy of nasal turbinates: Secondary | ICD-10-CM | POA: Diagnosis not present

## 2020-12-01 HISTORY — PX: NASAL SEPTUM SURGERY: SHX37

## 2020-12-12 ENCOUNTER — Other Ambulatory Visit: Payer: Self-pay

## 2020-12-12 DIAGNOSIS — M3501 Sicca syndrome with keratoconjunctivitis: Secondary | ICD-10-CM

## 2020-12-12 LAB — CBC WITH DIFFERENTIAL/PLATELET
Absolute Monocytes: 458 cells/uL (ref 200–950)
Basophils Absolute: 59 cells/uL (ref 0–200)
Basophils Relative: 1.4 %
Eosinophils Absolute: 151 cells/uL (ref 15–500)
Eosinophils Relative: 3.6 %
HCT: 41.9 % (ref 35.0–45.0)
Hemoglobin: 13.2 g/dL (ref 11.7–15.5)
Lymphs Abs: 1285 cells/uL (ref 850–3900)
MCH: 25.7 pg — ABNORMAL LOW (ref 27.0–33.0)
MCHC: 31.5 g/dL — ABNORMAL LOW (ref 32.0–36.0)
MCV: 81.5 fL (ref 80.0–100.0)
MPV: 10.1 fL (ref 7.5–12.5)
Monocytes Relative: 10.9 %
Neutro Abs: 2247 cells/uL (ref 1500–7800)
Neutrophils Relative %: 53.5 %
Platelets: 200 10*3/uL (ref 140–400)
RBC: 5.14 10*6/uL — ABNORMAL HIGH (ref 3.80–5.10)
RDW: 13.9 % (ref 11.0–15.0)
Total Lymphocyte: 30.6 %
WBC: 4.2 10*3/uL (ref 3.8–10.8)

## 2020-12-13 DIAGNOSIS — E875 Hyperkalemia: Secondary | ICD-10-CM | POA: Diagnosis not present

## 2020-12-13 NOTE — Progress Notes (Signed)
CBC stable

## 2020-12-20 DIAGNOSIS — J343 Hypertrophy of nasal turbinates: Secondary | ICD-10-CM | POA: Diagnosis not present

## 2020-12-20 DIAGNOSIS — E785 Hyperlipidemia, unspecified: Secondary | ICD-10-CM | POA: Diagnosis not present

## 2020-12-20 DIAGNOSIS — J342 Deviated nasal septum: Secondary | ICD-10-CM | POA: Diagnosis not present

## 2020-12-20 DIAGNOSIS — Z87891 Personal history of nicotine dependence: Secondary | ICD-10-CM | POA: Diagnosis not present

## 2020-12-20 DIAGNOSIS — R0981 Nasal congestion: Secondary | ICD-10-CM | POA: Diagnosis not present

## 2020-12-20 DIAGNOSIS — G4733 Obstructive sleep apnea (adult) (pediatric): Secondary | ICD-10-CM | POA: Diagnosis not present

## 2020-12-21 DIAGNOSIS — Z09 Encounter for follow-up examination after completed treatment for conditions other than malignant neoplasm: Secondary | ICD-10-CM | POA: Diagnosis not present

## 2020-12-28 DIAGNOSIS — Z09 Encounter for follow-up examination after completed treatment for conditions other than malignant neoplasm: Secondary | ICD-10-CM | POA: Diagnosis not present

## 2021-01-04 DIAGNOSIS — Z09 Encounter for follow-up examination after completed treatment for conditions other than malignant neoplasm: Secondary | ICD-10-CM | POA: Diagnosis not present

## 2021-01-30 DIAGNOSIS — Z79899 Other long term (current) drug therapy: Secondary | ICD-10-CM | POA: Diagnosis not present

## 2021-01-30 DIAGNOSIS — E7801 Familial hypercholesterolemia: Secondary | ICD-10-CM | POA: Diagnosis not present

## 2021-03-06 NOTE — Progress Notes (Signed)
Office Visit Note  Patient: Kaitlyn Hudson             Date of Birth: 1951-06-10           MRN: [id number]             PCP: Nonnie Done., MD Referring: Nonnie Done., MD Visit Date: 03/20/2021 Occupation: @GUAROCC @  Subjective:  Dry mouth and dry eyes.   History of Present Illness: Kaitlyn Hudson is a 70 y.o. female with history of Sjogren's, osteoarthritis and fibromyalgia syndrome.  She states the dry mouth symptoms are manageable with over-the-counter products.  For dry eyes she has been using Restasis and fish oil which has been helpful.  She continues to have some stiffness in her hands.  Fibromyalgia symptoms have been flaring with increased pain all over.  She denies insomnia although she continues to have some fatigue.  Activities of Daily Living:  Patient reports morning stiffness for 30 minutes.   Patient Denies nocturnal pain.  Difficulty dressing/grooming: Denies Difficulty climbing stairs: Denies Difficulty getting out of chair: Denies Difficulty using hands for taps, buttons, cutlery, and/or writing: Reports  Review of Systems  Constitutional:  Positive for fatigue.  HENT:  Positive for mouth dryness and nose dryness. Negative for mouth sores.   Eyes:  Positive for dryness. Negative for pain and itching.  Respiratory:  Negative for shortness of breath and difficulty breathing.   Cardiovascular:  Negative for chest pain and palpitations.  Gastrointestinal:  Negative for blood in stool, constipation and diarrhea.  Endocrine: Negative for increased urination.  Genitourinary:  Negative for difficulty urinating.  Musculoskeletal:  Positive for joint pain, joint pain and morning stiffness. Negative for joint swelling, myalgias, muscle tenderness and myalgias.  Skin:  Negative for color change, rash, redness and sensitivity to sunlight.  Allergic/Immunologic: Negative for susceptible to infections.  Neurological:  Positive for weakness. Negative for dizziness,  numbness, headaches and memory loss.  Hematological:  Negative for bruising/bleeding tendency.  Psychiatric/Behavioral:  Negative for depressed mood, confusion and sleep disturbance. The patient is not nervous/anxious.    PMFS History:  Patient Active Problem List   Diagnosis Date Noted   CAD in native artery 06/26/2018   Sleep apnea 05/15/2018   Angina pectoris (HCC) 05/15/2018   SOB (shortness of breath) on exertion 05/11/2018   Chest pressure 05/11/2018   Fibromyalgia 05/11/2018   Sjogren's syndrome with keratoconjunctivitis sicca (HCC) 04/08/2018   Former smoker 04/08/2018   Family history of systemic lupus erythematosus (SLE) in mother 04/08/2018   Dyslipidemia 04/08/2018   Primary osteoarthritis of both hands 04/08/2018   History of depression 03/16/2018   History of hyperlipidemia 03/16/2018   Anxiety 04/02/2016   History of cervical dysplasia 04/02/2016   Hyperlipidemia 04/02/2016    Past Medical History:  Diagnosis Date   Allergic rhinitis    Anxiety 04/02/2016   Chest pressure 05/11/2018   Depression    Dyslipidemia 04/08/2018   Exertional shortness of breath 05/11/2018   Family history of systemic lupus erythematosus (SLE) in mother 04/08/2018   Fibromyalgia    Former smoker 04/08/2018   History of cervical dysplasia 04/02/2016   2013; hysterectomy d/t recurrent dysplasia s/p LEEP   History of depression 03/16/2018   History of hyperlipidemia 03/16/2018   Hypercholesteremia 04/02/2016   Osteoarthritis    Primary osteoarthritis of both hands 04/08/2018   Sjogren's syndrome with keratoconjunctivitis sicca (HCC) 04/08/2018   +ANA, +Ro, +La, sicca symptoms, fatigue   Trochanteric bursitis, right hip 01/09/2018  Family History  Problem Relation Age of Onset   Lupus Mother    Osteoarthritis Mother    Polymyalgia rheumatica Mother    Osteoarthritis Father    Alzheimer's disease Father    Breast cancer Maternal Grandmother    Past Surgical History:  Procedure Laterality Date    HYSTEROTOMY     KNEE SURGERY Right    LYMPH NODE BIOPSY     groin    NASAL SEPTUM SURGERY  12/2020   Social History   Social History Narrative   Not on file   Immunization History  Administered Date(s) Administered   Influenza, High Dose Seasonal PF 06/24/2018   Moderna Sars-Covid-2 Vaccination 09/23/2019, 10/20/2019, 06/30/2020     Objective: Vital Signs: BP 112/74 (BP Location: Left Arm, Patient Position: Sitting, Cuff Size: Normal)   Pulse 72   Ht 5' 3.5" (1.613 m)   Wt 180 lb 12.8 oz (82 kg)   BMI 31.52 kg/m    Physical Exam Vitals and nursing note reviewed.  Constitutional:      Appearance: She is well-developed.  HENT:     Head: Normocephalic and atraumatic.  Eyes:     Conjunctiva/sclera: Conjunctivae normal.  Cardiovascular:     Rate and Rhythm: Normal rate and regular rhythm.     Heart sounds: Normal heart sounds.  Pulmonary:     Effort: Pulmonary effort is normal.     Breath sounds: Normal breath sounds.  Abdominal:     General: Bowel sounds are normal.     Palpations: Abdomen is soft.  Musculoskeletal:     Cervical back: Normal range of motion.  Lymphadenopathy:     Cervical: No cervical adenopathy.  Skin:    General: Skin is warm and dry.     Capillary Refill: Capillary refill takes less than 2 seconds.  Neurological:     Mental Status: She is alert and oriented to person, place, and time.  Psychiatric:        Behavior: Behavior normal.     Musculoskeletal Exam: C-spine was in good range of motion.  Shoulder joints, elbow joints, wrist joints, MCPs PIPs and DIPs with good range of motion.  She had bilateral PIP and DIP thickening.  Hip joints, knee joints, ankles, MTPs and PIPs with good range of motion with no synovitis.  She had limited extension of right knee without any warmth swelling or effusion.  CDAI Exam: CDAI Score: -- Patient Global: --; Provider Global: -- Swollen: --; Tender: -- Joint Exam 03/20/2021   No joint exam has been  documented for this visit   There is currently no information documented on the homunculus. Go to the Rheumatology activity and complete the homunculus joint exam.  Investigation: No additional findings.  Imaging: No results found.  Recent Labs: Lab Results  Component Value Date   WBC 4.2 12/12/2020   HGB 13.2 12/12/2020   PLT 200 12/12/2020   NA 137 10/24/2020   K 4.2 10/24/2020   CL 102 10/24/2020   CO2 27 10/24/2020   GLUCOSE 104 (H) 10/24/2020   BUN 18 10/24/2020   CREATININE 0.86 10/24/2020   BILITOT 0.5 10/24/2020   AST 24 10/24/2020   ALT 6 10/24/2020   PROT 7.9 10/24/2020   CALCIUM 9.9 10/24/2020   GFRAA 80 10/24/2020    Speciality Comments: No specialty comments available.  Procedures:  No procedures performed Allergies: Codeine   Assessment / Plan:     Visit Diagnoses: Sjogren's syndrome with keratoconjunctivitis sicca (HCC) - +ANA, +Ro, +La, +  RF, sicca symptoms, fatigue: -She continues to have dry mouth and dry eyes symptoms.  Dry mouth symptoms are manageable.  For dry eyes she has been using Restasis and using omega-3 which has been helpful.  Check following labs today.  Plan: CBC with Differential/Platelet, COMPLETE METABOLIC PANEL WITH GFR, Urinalysis, Routine w reflex microscopic, C3 and C4, Sedimentation rate, Rheumatoid factor, Serum protein electrophoresis with reflex, Anti-DNA antibody, double-stranded  Primary osteoarthritis of both hands-joint protection muscle strengthening was discussed.  Chronic pain of right knee-she complains of some discomfort in her right knee joint.  She had limited extension of right knee joint.  No warmth swelling or effusion was noted.  I offered x-ray which she declined.  Have given her a handout on knee joint exercises.  Fibromyalgia-she continues to have some generalized pain and discomfort from fibromyalgia.  She states fibromyalgia is flaring.  I offered physical therapy which she declined.  She states she will join  water aerobics soon.  Osteopenia of multiple sites - DEXA 08/30/2019 which showed T score -1.6.  Will need repeat bone density after December 2022.  Use of calcium rich diet, and exercise was emphasized.  Family history of systemic lupus erythematosus (SLE) in mother - AVISE index negative   History of depression  Dyslipidemia-creased risk of heart disease with autoimmune disease was discussed.  Instructions were placed in the AVS regarding dietary modifications and exercise.  Obstructive sleep apnea syndrome  Former smoker  Orders: Orders Placed This Encounter  Procedures   CBC with Differential/Platelet   COMPLETE METABOLIC PANEL WITH GFR   Urinalysis, Routine w reflex microscopic   C3 and C4   Sedimentation rate   Rheumatoid factor   Serum protein electrophoresis with reflex   Anti-DNA antibody, double-stranded    No orders of the defined types were placed in this encounter.    Follow-Up Instructions: Return in about 5 months (around 08/20/2021) for Sjogren's.   Pollyann Savoy, MD  Note - This record has been created using Animal nutritionist.  Chart creation errors have been sought, but may not always  have been located. Such creation errors do not reflect on  the standard of medical care.

## 2021-03-07 DIAGNOSIS — E875 Hyperkalemia: Secondary | ICD-10-CM | POA: Diagnosis not present

## 2021-03-20 ENCOUNTER — Other Ambulatory Visit: Payer: Self-pay

## 2021-03-20 ENCOUNTER — Encounter: Payer: Self-pay | Admitting: Rheumatology

## 2021-03-20 ENCOUNTER — Ambulatory Visit: Payer: PPO | Admitting: Rheumatology

## 2021-03-20 VITALS — BP 112/74 | HR 72 | Ht 63.5 in | Wt 180.8 lb

## 2021-03-20 DIAGNOSIS — M19042 Primary osteoarthritis, left hand: Secondary | ICD-10-CM

## 2021-03-20 DIAGNOSIS — M25561 Pain in right knee: Secondary | ICD-10-CM | POA: Diagnosis not present

## 2021-03-20 DIAGNOSIS — E785 Hyperlipidemia, unspecified: Secondary | ICD-10-CM | POA: Diagnosis not present

## 2021-03-20 DIAGNOSIS — G4733 Obstructive sleep apnea (adult) (pediatric): Secondary | ICD-10-CM

## 2021-03-20 DIAGNOSIS — Z8269 Family history of other diseases of the musculoskeletal system and connective tissue: Secondary | ICD-10-CM | POA: Diagnosis not present

## 2021-03-20 DIAGNOSIS — M3501 Sicca syndrome with keratoconjunctivitis: Secondary | ICD-10-CM

## 2021-03-20 DIAGNOSIS — G8929 Other chronic pain: Secondary | ICD-10-CM

## 2021-03-20 DIAGNOSIS — Z8659 Personal history of other mental and behavioral disorders: Secondary | ICD-10-CM

## 2021-03-20 DIAGNOSIS — M19041 Primary osteoarthritis, right hand: Secondary | ICD-10-CM | POA: Diagnosis not present

## 2021-03-20 DIAGNOSIS — M797 Fibromyalgia: Secondary | ICD-10-CM | POA: Diagnosis not present

## 2021-03-20 DIAGNOSIS — Z87891 Personal history of nicotine dependence: Secondary | ICD-10-CM | POA: Diagnosis not present

## 2021-03-20 DIAGNOSIS — M8589 Other specified disorders of bone density and structure, multiple sites: Secondary | ICD-10-CM | POA: Diagnosis not present

## 2021-03-20 NOTE — Patient Instructions (Addendum)
Journal for Nurse Practitioners, 15(4), 263-267. Retrieved June 08, 2018 from [email or web address].[email or web address]">  Knee Exercises Ask your health care provider which exercises are safe for you. Do exercises exactly as told by your health care provider and adjust them as directed. It is normal to feel mild stretching, pulling, tightness, or discomfort as you do these exercises. Stop right away if you feel sudden pain or your pain gets worse. Do not begin these exercises until told by your health care provider. Stretching and range-of-motion exercises These exercises warm up your muscles and joints and improve the movement and flexibility of your knee. These exercises also help to relieve pain andswelling. Knee extension, prone Lie on your abdomen (prone position) on a bed. Place your left / right knee just beyond the edge of the surface so your knee is not on the bed. You can put a towel under your left / right thigh just above your kneecap for comfort. Relax your leg muscles and allow gravity to straighten your knee (extension). You should feel a stretch behind your left / right knee. Hold this position for __________ seconds. Scoot up so your knee is supported between repetitions. Repeat __________ times. Complete this exercise __________ times a day. Knee flexion, active  Lie on your back with both legs straight. If this causes back discomfort, bend your left / right knee so your foot is flat on the floor. Slowly slide your left / right heel back toward your buttocks. Stop when you feel a gentle stretch in the front of your knee or thigh (flexion). Hold this position for __________ seconds. Slowly slide your left / right heel back to the starting position. Repeat __________ times. Complete this exercise __________ times a day. Quadriceps stretch, prone  Lie on your abdomen on a firm surface, such as a bed or padded floor. Bend your left / right knee and hold your ankle. If you cannot reach  your ankle or pant leg, loop a belt around your foot and grab the belt instead. Gently pull your heel toward your buttocks. Your knee should not slide out to the side. You should feel a stretch in the front of your thigh and knee (quadriceps). Hold this position for __________ seconds. Repeat __________ times. Complete this exercise __________ times a day. Hamstring, supine Lie on your back (supine position). Loop a belt or towel over the ball of your left / right foot. The ball of your foot is on the walking surface, right under your toes. Straighten your left / right knee and slowly pull on the belt to raise your leg until you feel a gentle stretch behind your knee (hamstring). Do not let your knee bend while you do this. Keep your other leg flat on the floor. Hold this position for __________ seconds. Repeat __________ times. Complete this exercise __________ times a day. Strengthening exercises These exercises build strength and endurance in your knee. Endurance is theability to use your muscles for a long time, even after they get tired. Quadriceps, isometric This exercise stretches the muscles in front of your thigh (quadriceps) without moving your knee joint (isometric). Lie on your back with your left / right leg extended and your other knee bent. Put a rolled towel or small pillow under your knee if told by your health care provider. Slowly tense the muscles in the front of your left / right thigh. You should see your kneecap slide up toward your hip or see increased dimpling just above the knee. This motion will   push the back of the knee toward the floor. For __________ seconds, hold the muscle as tight as you can without increasing your pain. Relax the muscles slowly and completely. Repeat __________ times. Complete this exercise __________ times a day. Straight leg raises This exercise stretches the muscles in front of your thigh (quadriceps) and the muscles that move your hips (hip  flexors). Lie on your back with your left / right leg extended and your other knee bent. Tense the muscles in the front of your left / right thigh. You should see your kneecap slide up or see increased dimpling just above the knee. Your thigh may even shake a bit. Keep these muscles tight as you raise your leg 4-6 inches (10-15 cm) off the floor. Do not let your knee bend. Hold this position for __________ seconds. Keep these muscles tense as you lower your leg. Relax your muscles slowly and completely after each repetition. Repeat __________ times. Complete this exercise __________ times a day. Hamstring, isometric Lie on your back on a firm surface. Bend your left / right knee about __________ degrees. Dig your left / right heel into the surface as if you are trying to pull it toward your buttocks. Tighten the muscles in the back of your thighs (hamstring) to "dig" as hard as you can without increasing any pain. Hold this position for __________ seconds. Release the tension gradually and allow your muscles to relax completely for __________ seconds after each repetition. Repeat __________ times. Complete this exercise __________ times a day. Hamstring curls If told by your health care provider, do this exercise while wearing ankle weights. Begin with __________ lb weights. Then increase the weight by 1 lb (0.5 kg) increments. Do not wear ankle weights that are more than __________ lb. Lie on your abdomen with your legs straight. Bend your left / right knee as far as you can without feeling pain. Keep your hips flat against the floor. Hold this position for __________ seconds. Slowly lower your leg to the starting position. Repeat __________ times. Complete this exercise __________ times a day. Squats This exercise strengthens the muscles in front of your thigh and knee (quadriceps). Stand in front of a table, with your feet and knees pointing straight ahead. You may rest your hands on the  table for balance but not for support. Slowly bend your knees and lower your hips like you are going to sit in a chair. Keep your weight over your heels, not over your toes. Keep your lower legs upright so they are parallel with the table legs. Do not let your hips go lower than your knees. Do not bend lower than told by your health care provider. If your knee pain increases, do not bend as low. Hold the squat position for __________ seconds. Slowly push with your legs to return to standing. Do not use your hands to pull yourself to standing. Repeat __________ times. Complete this exercise __________ times a day. Wall slides This exercise strengthens the muscles in front of your thigh and knee (quadriceps). Lean your back against a smooth wall or door, and walk your feet out 18-24 inches (46-61 cm) from it. Place your feet hip-width apart. Slowly slide down the wall or door until your knees bend __________ degrees. Keep your knees over your heels, not over your toes. Keep your knees in line with your hips. Hold this position for __________ seconds. Repeat __________ times. Complete this exercise __________ times a day. Straight leg raises This exercise   strengthens the muscles that rotate the leg at the hip and move it away from your body (hip abductors). Lie on your side with your left / right leg in the top position. Lie so your head, shoulder, knee, and hip line up. You may bend your bottom knee to help you keep your balance. Roll your hips slightly forward so your hips are stacked directly over each other and your left / right knee is facing forward. Leading with your heel, lift your top leg 4-6 inches (10-15 cm). You should feel the muscles in your outer hip lifting. Do not let your foot drift forward. Do not let your knee roll toward the ceiling. Hold this position for __________ seconds. Slowly return your leg to the starting position. Let your muscles relax completely after each  repetition. Repeat __________ times. Complete this exercise __________ times a day. Straight leg raises This exercise stretches the muscles that move your hips away from the front of the pelvis (hip extensors). Lie on your abdomen on a firm surface. You can put a pillow under your hips if that is more comfortable. Tense the muscles in your buttocks and lift your left / right leg about 4-6 inches (10-15 cm). Keep your knee straight as you lift your leg. Hold this position for __________ seconds. Slowly lower your leg to the starting position. Let your leg relax completely after each repetition. Repeat __________ times. Complete this exercise __________ times a day. This information is not intended to replace advice given to you by your health care provider. Make sure you discuss any questions you have with your healthcare provider. Document Revised: 06/09/2018 Document Reviewed: 06/09/2018 Elsevier Patient Education  2022 [employer]   Heart Disease Prevention   Your inflammatory disease increases your risk of heart disease which includes heart attack, stroke, atrial fibrillation (irregular heartbeats), high blood pressure, heart failure and atherosclerosis (plaque in the arteries).  It is important to reduce your risk by:   Keep blood pressure, cholesterol, and blood sugar at healthy levels   Smoking Cessation   Maintain a healthy weight  BMI 20-25   Eat a healthy diet  Plenty of fresh fruit, vegetables, and whole grains  Limit saturated fats, foods high in sodium, and added sugars  DASH and Mediterranean diet   Increase physical activity  Recommend moderate physically activity for 150 minutes per week/ 30 minutes a day for five days a week These can be broken up into three separate ten-minute sessions during the day.   Reduce Stress  Meditation, slow breathing exercises, yoga, coloring books  Dental visits twice a year   

## 2021-03-22 LAB — SEDIMENTATION RATE: Sed Rate: 19 mm/h (ref 0–30)

## 2021-03-22 LAB — C3 AND C4
C3 Complement: 114 mg/dL (ref 83–193)
C4 Complement: 32 mg/dL (ref 15–57)

## 2021-03-22 LAB — CBC WITH DIFFERENTIAL/PLATELET
Absolute Monocytes: 300 {cells}/uL (ref 200–950)
Basophils Absolute: 49 {cells}/uL (ref 0–200)
Basophils Relative: 1.3 %
Eosinophils Absolute: 152 {cells}/uL (ref 15–500)
Eosinophils Relative: 4 %
HCT: 40.7 % (ref 35.0–45.0)
Hemoglobin: 13 g/dL (ref 11.7–15.5)
Lymphs Abs: 1064 {cells}/uL (ref 850–3900)
MCH: 26.5 pg — ABNORMAL LOW (ref 27.0–33.0)
MCHC: 31.9 g/dL — ABNORMAL LOW (ref 32.0–36.0)
MCV: 82.9 fL (ref 80.0–100.0)
MPV: 10.8 fL (ref 7.5–12.5)
Monocytes Relative: 7.9 %
Neutro Abs: 2234 {cells}/uL (ref 1500–7800)
Neutrophils Relative %: 58.8 %
Platelets: 203 Thousand/uL (ref 140–400)
RBC: 4.91 Million/uL (ref 3.80–5.10)
RDW: 14.2 % (ref 11.0–15.0)
Total Lymphocyte: 28 %
WBC: 3.8 Thousand/uL (ref 3.8–10.8)

## 2021-03-22 LAB — URINALYSIS, ROUTINE W REFLEX MICROSCOPIC
Bilirubin Urine: NEGATIVE
Glucose, UA: NEGATIVE
Hgb urine dipstick: NEGATIVE
Ketones, ur: NEGATIVE
Leukocytes,Ua: NEGATIVE
Nitrite: NEGATIVE
Protein, ur: NEGATIVE
Specific Gravity, Urine: 1.008 (ref 1.001–1.035)
pH: 6 (ref 5.0–8.0)

## 2021-03-22 LAB — COMPLETE METABOLIC PANEL WITH GFR
AG Ratio: 1.2 (calc) (ref 1.0–2.5)
ALT: 6 U/L (ref 6–29)
AST: 22 U/L (ref 10–35)
Albumin: 4.2 g/dL (ref 3.6–5.1)
Alkaline phosphatase (APISO): 57 U/L (ref 37–153)
BUN: 14 mg/dL (ref 7–25)
CO2: 27 mmol/L (ref 20–32)
Calcium: 10.4 mg/dL (ref 8.6–10.4)
Chloride: 104 mmol/L (ref 98–110)
Creat: 0.83 mg/dL (ref 0.50–1.05)
Globulin: 3.6 g/dL (calc) (ref 1.9–3.7)
Glucose, Bld: 78 mg/dL (ref 65–99)
Potassium: 4.5 mmol/L (ref 3.5–5.3)
Sodium: 137 mmol/L (ref 135–146)
Total Bilirubin: 0.4 mg/dL (ref 0.2–1.2)
Total Protein: 7.8 g/dL (ref 6.1–8.1)
eGFR: 77 mL/min/{1.73_m2} (ref >=60)

## 2021-03-22 LAB — ANTI-DNA ANTIBODY, DOUBLE-STRANDED: ds DNA Ab: 3 IU/mL

## 2021-03-22 LAB — PROTEIN ELECTROPHORESIS, SERUM, WITH REFLEX
Albumin ELP: 4.4 g/dL (ref 3.8–4.8)
Alpha 1: 0.3 g/dL (ref 0.2–0.3)
Alpha 2: 0.6 g/dL (ref 0.5–0.9)
Beta 2: 0.4 g/dL (ref 0.2–0.5)
Beta Globulin: 0.5 g/dL (ref 0.4–0.6)
Gamma Globulin: 1.6 g/dL (ref 0.8–1.7)
Total Protein: 7.8 g/dL (ref 6.1–8.1)

## 2021-03-22 LAB — RHEUMATOID FACTOR: Rheumatoid fact SerPl-aCnc: 25 IU/mL — ABNORMAL HIGH (ref <14)

## 2021-07-25 ENCOUNTER — Other Ambulatory Visit: Payer: Self-pay | Admitting: Family Medicine

## 2021-07-25 DIAGNOSIS — Z1231 Encounter for screening mammogram for malignant neoplasm of breast: Secondary | ICD-10-CM

## 2021-08-06 ENCOUNTER — Ambulatory Visit
Admission: RE | Admit: 2021-08-06 | Discharge: 2021-08-06 | Disposition: A | Discharge status: Home / Self Care | Payer: PPO | Source: Ambulatory Visit | Attending: Family Medicine | Admitting: Family Medicine

## 2021-08-06 ENCOUNTER — Other Ambulatory Visit: Payer: Self-pay

## 2021-08-06 DIAGNOSIS — Z1231 Encounter for screening mammogram for malignant neoplasm of breast: Secondary | ICD-10-CM

## 2021-08-07 NOTE — Progress Notes (Signed)
Office Visit Note  Patient: Kaitlyn Hudson             Date of Birth: 01-13-1951           MRN: [id number]             PCP: Enid Skeens., MD Referring: Enid Skeens., MD Visit Date: 08/21/2021 Occupation: @GUAROCC @  Subjective:  Sicca symptoms   History of Present Illness: Kaitlyn Hudson is a 70 y.o. female with history of sjogren's syndrome, fibromyalgia, and osteoarthritis.  She has not currently taking any immunosuppressive agents.  She continues to have chronic sicca symptoms.  She has noticed increased eye dryness over the past several weeks.  She has not been using her Restasis eyedrops on a daily basis as recommended but has started to use them as directed.  She has been seeing her ophthalmologist on a yearly basis.  She continues to use Biotene products and lozenges as needed for dry mouth.  She denies any swollen lymph nodes.  She has not had any sores in her mouth or nose.  She continues to see the dentist every 6 months.  She denies any increased joint pain or joint swelling at this time.  She has had some increased myalgias due to fibromyalgia.  She presents today with trapezius muscle tension and tenderness bilaterally.  She has considered proceeding with a massage.  Her energy level has been stable overall.  She is not having difficulty sleeping at night since her husband passed away right before Thanksgiving. She is not currently taking a sleep aid.   Activities of Daily Living:  Patient reports morning stiffness for 30 minutes.   Patient Denies nocturnal pain.  Difficulty dressing/grooming: Denies Difficulty climbing stairs: Denies Difficulty getting out of chair: Denies Difficulty using hands for taps, buttons, cutlery, and/or writing: Reports  Review of Systems  Constitutional:  Positive for fatigue.  HENT:  Positive for mouth dryness. Negative for mouth sores and nose dryness.   Eyes:  Positive for dryness. Negative for pain and itching.  Respiratory:  Negative for  shortness of breath and difficulty breathing.   Cardiovascular:  Negative for chest pain and palpitations.  Gastrointestinal:  Negative for blood in stool, constipation and diarrhea.  Endocrine: Negative for increased urination.  Genitourinary:  Negative for difficulty urinating.  Musculoskeletal:  Positive for joint pain, joint pain, joint swelling, myalgias, morning stiffness, muscle tenderness and myalgias.  Skin:  Negative for color change, rash and redness.  Allergic/Immunologic: Negative for susceptible to infections.  Neurological:  Negative for dizziness, numbness, headaches, memory loss and weakness.  Hematological:  Negative for bruising/bleeding tendency.  Psychiatric/Behavioral:  Negative for confusion.    PMFS History:  Patient Active Problem List   Diagnosis Date Noted   CAD in native artery 06/26/2018   Sleep apnea 05/15/2018   Angina pectoris ([place]) 05/15/2018   SOB (shortness of breath) on exertion 05/11/2018   Chest pressure 05/11/2018   Fibromyalgia 05/11/2018   Sjogren's syndrome with keratoconjunctivitis sicca ([place]) 04/08/2018   Former smoker 04/08/2018   Family history of systemic lupus erythematosus (SLE) in mother 04/08/2018   Dyslipidemia 04/08/2018   Primary osteoarthritis of both hands 04/08/2018   History of depression 03/16/2018   History of hyperlipidemia 03/16/2018   Anxiety 04/02/2016   History of cervical dysplasia 04/02/2016   Hyperlipidemia 04/02/2016    Past Medical History:  Diagnosis Date   Allergic rhinitis    Anxiety 04/02/2016   Chest pressure 05/11/2018   Depression  Dyslipidemia 04/08/2018   Exertional shortness of breath 05/11/2018   Family history of systemic lupus erythematosus (SLE) in mother 04/08/2018   Fibromyalgia    Former smoker 04/08/2018   History of cervical dysplasia 04/02/2016   2013; hysterectomy d/t recurrent dysplasia s/p LEEP   History of depression 03/16/2018   History of hyperlipidemia 03/16/2018   Hypercholesteremia  04/02/2016   Osteoarthritis    Primary osteoarthritis of both hands 04/08/2018   Sjogren's syndrome with keratoconjunctivitis sicca ([place]) 04/08/2018   +ANA, +Ro, +La, sicca symptoms, fatigue   Trochanteric bursitis, right hip 01/09/2018    Family History  Problem Relation Age of Onset   Lupus Mother    Osteoarthritis Mother    Polymyalgia rheumatica Mother    Osteoarthritis Father    Alzheimer's disease Father    Breast cancer Maternal Grandmother    Past Surgical History:  Procedure Laterality Date   HYSTEROTOMY     KNEE SURGERY Right    LYMPH NODE BIOPSY     groin    NASAL SEPTUM SURGERY  12/2020   Social History   Social History Narrative   Not on file   Immunization History  Administered Date(s) Administered   Influenza, High Dose Seasonal PF 06/24/2018   Moderna Sars-Covid-2 Vaccination 09/23/2019, 10/20/2019, 06/30/2020     Objective: Vital Signs: BP 105/71 (BP Location: Left Arm, Patient Position: Sitting, Cuff Size: Large)   Pulse 85   Ht 5' 3.5" (1.613 m)   Wt 168 lb 12.8 oz (76.6 kg)   BMI 29.43 kg/m    Physical Exam Vitals and nursing note reviewed.  Constitutional:      Appearance: She is well-developed.  HENT:     Head: Normocephalic and atraumatic.  Eyes:     Conjunctiva/sclera: Conjunctivae normal.  Cardiovascular:     Rate and Rhythm: Normal rate and regular rhythm.     Heart sounds: Normal heart sounds.  Pulmonary:     Effort: Pulmonary effort is normal.     Breath sounds: Normal breath sounds.  Abdominal:     General: Bowel sounds are normal.     Palpations: Abdomen is soft.  Musculoskeletal:     Cervical back: Normal range of motion.  Skin:    General: Skin is warm and dry.     Capillary Refill: Capillary refill takes less than 2 seconds.  Neurological:     Mental Status: She is alert and oriented to person, place, and time.  Psychiatric:        Behavior: Behavior normal.     Musculoskeletal Exam: C-spine has limited range of motion  with lateral rotation.  Trapezius muscle tension and tenderness noted bilaterally.  Thoracic and lumbar spine have good range of motion.  Shoulder joints, elbow joints, wrist joints, MCPs, PIPs, DIPs have good range of motion with no synovitis.  PIP and DIP thickening consistent with osteoarthritis of both hands.  Complete fist formation bilaterally.  Hip joints have good range of motion with no groin pain.  Right knee has slightly limited extension.  Left knee joint has good range of motion.  No warmth or effusion of knee joints noted.  Ankle joints have good range of motion with no tenderness or joint swelling.  CDAI Exam: CDAI Score: -- Patient Global: --; Provider Global: -- Swollen: --; Tender: -- Joint Exam 08/21/2021   No joint exam has been documented for this visit   There is currently no information documented on the homunculus. Go to the Rheumatology activity and complete the  homunculus joint exam.  Investigation: No additional findings.  Imaging: MM 3D SCREEN BREAST BILATERAL  Result Date: 08/07/2021 CLINICAL DATA:  Screening. EXAM: DIGITAL SCREENING BILATERAL MAMMOGRAM WITH TOMOSYNTHESIS AND CAD TECHNIQUE: Bilateral screening digital craniocaudal and mediolateral oblique mammograms were obtained. Bilateral screening digital breast tomosynthesis was performed. The images were evaluated with computer-aided detection. COMPARISON:  Previous exam(s). ACR Breast Density Category b: There are scattered areas of fibroglandular density. FINDINGS: There are no findings suspicious for malignancy. IMPRESSION: No mammographic evidence of malignancy. A result letter of this screening mammogram will be mailed directly to the patient. RECOMMENDATION: Screening mammogram in one year. (Code:SM-B-01Y) BI-RADS CATEGORY  1: Negative. Electronically Signed   By: Abelardo Diesel M.D.   On: 08/07/2021 09:06    Recent Labs: Lab Results  Component Value Date   WBC 3.8 03/20/2021   HGB 13.0 03/20/2021   PLT  203 03/20/2021   NA 137 03/20/2021   K 4.5 03/20/2021   CL 104 03/20/2021   CO2 27 03/20/2021   GLUCOSE 78 03/20/2021   BUN 14 03/20/2021   CREATININE 0.83 03/20/2021   BILITOT 0.4 03/20/2021   AST 22 03/20/2021   ALT 6 03/20/2021   PROT 7.8 03/20/2021   PROT 7.8 03/20/2021   CALCIUM 10.4 03/20/2021   GFRAA 80 10/24/2020    Speciality Comments: No specialty comments available.  Procedures:  No procedures performed Allergies: Codeine   Assessment / Plan:     Visit Diagnoses: Sjogren's syndrome with keratoconjunctivitis sicca ([facility]) - +ANA, +Ro, +La, +RF, sicca symptoms, fatigue: She continues to have chronic sicca symptoms secondary to Sjogren's syndrome.  She has had increased eye dryness over the past several weeks and has restarted using Restasis as directed.  Discussed the use of refresh gel drops at bedtime for breakthrough symptoms.  She continues to see the ophthalmologist on a yearly basis.  Mild dryness has been tolerable and she uses Biotene products and avulsions as needed.  She continues to see the dentist every 6 months.  She has not had any cervical lymphadenopathy or oral or nasal ulcerations.  She has no signs of inflammatory arthritis at this time.  No joint tenderness or synovitis was noted.  She has not had any shortness of breath, pleuritic chest pain, or cough.  Her lungs are clear to auscultation on examination today. Discussed the association between lymphoma and Sjogren's syndrome.  CBC with differential, sed rate, and SPEP will be updated today. Lab work from 03/20/2021 was reviewed today in the office: dsDNA negative, SPEP did not reveal any monoclonal proteins, RF 25, ESR WNL, complements WNL, UA normal, CMP WNL, and CBC stable.  The following lab work will be drawn today for further evaluation. She does not require immunosuppression at this time.  She was advised to notify us if she develops any new or worsening symptoms.  She will follow-up in the office in 5  months.  - Plan: CBC with Differential/Platelet, COMPLETE METABOLIC PANEL WITH GFR, Urinalysis, Routine w reflex microscopic, ANA, C3 and C4, Sedimentation rate, Rheumatoid factor, Serum protein electrophoresis with reflex, Sjogrens syndrome-A extractable nuclear antibody, Sjogrens syndrome-B extractable nuclear antibody  Primary osteoarthritis of both hands: She has PIP and DIP thickening consistent with osteoarthritis of both hands.  No tenderness or inflammation was noted on examination today.  She was able to make a complete fist bilaterally.  Chronic pain of right knee: She has slightly limited extension of the right knee joint on examination today.  No warmth or effusion  was noted.  Discussed the importance of joint protection and muscle strengthening.  Fibromyalgia: She experiences intermittent myalgias and muscle tenderness due to fibromyalgia.  She presents today with trapezius muscle tension and tenderness bilaterally.  Different treatment options were discussed today.  She plans on scheduling a massage.  Discussed the importance of appropriate posture as well as using a heating pad or pain patches for symptomatic relief.  If her symptoms persist or worsen she can return to the office for trigger point injections in the future. She continues to experience fatigue intermittently which is exacerbated by insomnia.  Her husband passed away in 08-20-2023 so she has been grieving this loss.  Discussed the importance of regular exercise and good sleep hygiene.  Osteopenia of multiple sites - DEXA 08/30/2019 which showed T score -1.6.  DEXA ordered by PCP in 2020.  She is due to update her bone density.  She will call her PCP for them to place a new order at the breast center.  Discussed that the current guidelines for management of osteopenia is to take calcium, vitamin D, and perform resistive exercises.  She will continue to require updated DEXA every 2 years.  Other medical conditions are listed as  follows:  Family history of systemic lupus erythematosus (SLE) in mother - AVISE index negative   History of depression  Dyslipidemia  Obstructive sleep apnea syndrome  Former smoker  Orders: Orders Placed This Encounter  Procedures   CBC with Differential/Platelet   COMPLETE METABOLIC PANEL WITH GFR   Urinalysis, Routine w reflex microscopic   ANA   C3 and C4   Sedimentation rate   Rheumatoid factor   Serum protein electrophoresis with reflex   Sjogrens syndrome-A extractable nuclear antibody   Sjogrens syndrome-B extractable nuclear antibody   No orders of the defined types were placed in this encounter.   Follow-Up Instructions: Return in about 5 months (around 01/19/2022) for Sjogren's syndrome, Fibromyalgia, Osteoarthritis.   Ofilia Neas, PA-C  Note - This record has been created using Dragon software.  Chart creation errors have been sought, but may not always  have been located. Such creation errors do not reflect on  the standard of medical care.

## 2021-08-21 ENCOUNTER — Other Ambulatory Visit: Payer: Self-pay

## 2021-08-21 ENCOUNTER — Ambulatory Visit: Payer: PPO | Admitting: Physician Assistant

## 2021-08-21 ENCOUNTER — Encounter: Payer: Self-pay | Admitting: Physician Assistant

## 2021-08-21 VITALS — BP 105/71 | HR 85 | Ht 63.5 in | Wt 168.8 lb

## 2021-08-21 DIAGNOSIS — Z8659 Personal history of other mental and behavioral disorders: Secondary | ICD-10-CM

## 2021-08-21 DIAGNOSIS — M19041 Primary osteoarthritis, right hand: Secondary | ICD-10-CM | POA: Diagnosis not present

## 2021-08-21 DIAGNOSIS — M19042 Primary osteoarthritis, left hand: Secondary | ICD-10-CM | POA: Diagnosis not present

## 2021-08-21 DIAGNOSIS — Z87891 Personal history of nicotine dependence: Secondary | ICD-10-CM

## 2021-08-21 DIAGNOSIS — M8589 Other specified disorders of bone density and structure, multiple sites: Secondary | ICD-10-CM | POA: Diagnosis not present

## 2021-08-21 DIAGNOSIS — M797 Fibromyalgia: Secondary | ICD-10-CM

## 2021-08-21 DIAGNOSIS — G8929 Other chronic pain: Secondary | ICD-10-CM | POA: Diagnosis not present

## 2021-08-21 DIAGNOSIS — M25561 Pain in right knee: Secondary | ICD-10-CM | POA: Diagnosis not present

## 2021-08-21 DIAGNOSIS — M3501 Sicca syndrome with keratoconjunctivitis: Secondary | ICD-10-CM | POA: Diagnosis not present

## 2021-08-21 DIAGNOSIS — Z8269 Family history of other diseases of the musculoskeletal system and connective tissue: Secondary | ICD-10-CM | POA: Diagnosis not present

## 2021-08-21 DIAGNOSIS — E785 Hyperlipidemia, unspecified: Secondary | ICD-10-CM

## 2021-08-21 DIAGNOSIS — G4733 Obstructive sleep apnea (adult) (pediatric): Secondary | ICD-10-CM

## 2021-08-23 LAB — URINALYSIS, ROUTINE W REFLEX MICROSCOPIC
Bilirubin Urine: NEGATIVE
Glucose, UA: NEGATIVE
Hgb urine dipstick: NEGATIVE
Ketones, ur: NEGATIVE
Leukocytes,Ua: NEGATIVE
Nitrite: NEGATIVE
Protein, ur: NEGATIVE
Specific Gravity, Urine: 1.01 (ref 1.001–1.035)
pH: 5.5 (ref 5.0–8.0)

## 2021-08-23 LAB — CBC WITH DIFFERENTIAL/PLATELET
Absolute Monocytes: 249 cells/uL (ref 200–950)
Basophils Absolute: 60 cells/uL (ref 0–200)
Basophils Relative: 1.7 %
Eosinophils Absolute: 140 cells/uL (ref 15–500)
Eosinophils Relative: 4 %
HCT: 42.1 % (ref 35.0–45.0)
Hemoglobin: 13.5 g/dL (ref 11.7–15.5)
Lymphs Abs: 1026 cells/uL (ref 850–3900)
MCH: 26.5 pg — ABNORMAL LOW (ref 27.0–33.0)
MCHC: 32.1 g/dL (ref 32.0–36.0)
MCV: 82.7 fL (ref 80.0–100.0)
MPV: 11.1 fL (ref 7.5–12.5)
Monocytes Relative: 7.1 %
Neutro Abs: 2027 cells/uL (ref 1500–7800)
Neutrophils Relative %: 57.9 %
Platelets: 200 10*3/uL (ref 140–400)
RBC: 5.09 10*6/uL (ref 3.80–5.10)
RDW: 13.7 % (ref 11.0–15.0)
Total Lymphocyte: 29.3 %
WBC: 3.5 10*3/uL — ABNORMAL LOW (ref 3.8–10.8)

## 2021-08-23 LAB — COMPLETE METABOLIC PANEL WITH GFR
AG Ratio: 1.3 (calc) (ref 1.0–2.5)
ALT: 6 U/L (ref 6–29)
AST: 21 U/L (ref 10–35)
Albumin: 4.5 g/dL (ref 3.6–5.1)
Alkaline phosphatase (APISO): 66 U/L (ref 37–153)
BUN: 18 mg/dL (ref 7–25)
CO2: 26 mmol/L (ref 20–32)
Calcium: 10.1 mg/dL (ref 8.6–10.4)
Chloride: 106 mmol/L (ref 98–110)
Creat: 0.92 mg/dL (ref 0.50–1.05)
Globulin: 3.4 g/dL (calc) (ref 1.9–3.7)
Glucose, Bld: 77 mg/dL (ref 65–99)
Potassium: 4.3 mmol/L (ref 3.5–5.3)
Sodium: 139 mmol/L (ref 135–146)
Total Bilirubin: 0.4 mg/dL (ref 0.2–1.2)
Total Protein: 7.9 g/dL (ref 6.1–8.1)
eGFR: 67 mL/min/{1.73_m2} (ref >=60)

## 2021-08-23 LAB — PROTEIN ELECTROPHORESIS, SERUM, WITH REFLEX
Albumin ELP: 4.2 g/dL (ref 3.8–4.8)
Alpha 1: 0.3 g/dL (ref 0.2–0.3)
Alpha 2: 0.7 g/dL (ref 0.5–0.9)
Beta 2: 0.4 g/dL (ref 0.2–0.5)
Beta Globulin: 0.5 g/dL (ref 0.4–0.6)
Gamma Globulin: 1.5 g/dL (ref 0.8–1.7)
Total Protein: 7.6 g/dL (ref 6.1–8.1)

## 2021-08-23 LAB — SJOGRENS SYNDROME-B EXTRACTABLE NUCLEAR ANTIBODY: SSB (La) (ENA) Antibody, IgG: >8 AI — AB

## 2021-08-23 LAB — RHEUMATOID FACTOR: Rheumatoid fact SerPl-aCnc: 19 IU/mL — ABNORMAL HIGH (ref <14)

## 2021-08-23 LAB — ANA: Anti Nuclear Antibody (ANA): POSITIVE — AB

## 2021-08-23 LAB — ANTI-NUCLEAR AB-TITER (ANA TITER): ANA Titer 1: 1:1280 {titer} — ABNORMAL HIGH

## 2021-08-23 LAB — SJOGRENS SYNDROME-A EXTRACTABLE NUCLEAR ANTIBODY: SSA (Ro) (ENA) Antibody, IgG: >8 AI — AB

## 2021-08-23 LAB — C3 AND C4
C3 Complement: 116 mg/dL (ref 83–193)
C4 Complement: 35 mg/dL (ref 15–57)

## 2021-08-23 LAB — SEDIMENTATION RATE: Sed Rate: 25 mm/h (ref 0–30)

## 2021-08-23 NOTE — Progress Notes (Signed)
White cell count is low at 3.5 which fluctuates. CMP is normal, UA is negative, complements are normal, sed rate is normal, rheumatoid factor is lower positive and stable SPEP normal SSA and SSB antibodies are positive.  All the labs are consistent with Sjogren's syndrome.

## 2022-01-08 NOTE — Progress Notes (Signed)
Office Visit Note  Patient: Kaitlyn Hudson             Date of Birth: 03-16-1951           MRN: [id number]             PCP: Nonnie Done., MD Referring: Nonnie Done., MD Visit Date: 01/22/2022 Occupation: @GUAROCC @  Subjective:  Right hip pain  History of Present Illness: CARRERA KIESEL is a 71 y.o. female with history of Sjogren's, osteoarthritis and fibromyalgia syndrome.  She states she continues to have dry mouth and dry eyes.  She denies any dental cavities.  She has some discomfort in her bilateral hands and her right knee joint.  She has not noticed any joint swelling.  Recently she has been experiencing pain in her right.  She has intermittent flares of fibromyalgia.  Activities of Daily Living:  Patient reports morning stiffness for 30 minutes.   Patient Denies nocturnal pain.  Difficulty dressing/grooming: Denies Difficulty climbing stairs: Denies Difficulty getting out of chair: Denies Difficulty using hands for taps, buttons, cutlery, and/or writing: Reports  Review of Systems  Constitutional:  Positive for fatigue.  HENT:  Positive for mouth dryness.   Eyes:  Positive for dryness.  Respiratory:  Negative for shortness of breath.   Cardiovascular:  Negative for swelling in legs/feet.  Gastrointestinal:  Negative for constipation.  Endocrine: Positive for excessive thirst.  Genitourinary:  Negative for difficulty urinating.  Musculoskeletal:  Positive for joint pain, joint pain, morning stiffness and muscle tenderness.  Skin:  Negative for color change, rash and sensitivity to sunlight.  Allergic/Immunologic: Negative for susceptible to infections.  Neurological:  Negative for numbness.  Hematological:  Negative for bruising/bleeding tendency and swollen glands.  Psychiatric/Behavioral:  Negative for sleep disturbance.    PMFS History:  Patient Active Problem List   Diagnosis Date Noted   CAD in native artery 06/26/2018   Sleep apnea 05/15/2018   Angina  pectoris ([facility]) 05/15/2018   SOB (shortness of breath) on exertion 05/11/2018   Chest pressure 05/11/2018   Fibromyalgia 05/11/2018   Sjogren's syndrome with keratoconjunctivitis sicca ([facility]) 04/08/2018   Former smoker 04/08/2018   Family history of systemic lupus erythematosus (SLE) in mother 04/08/2018   Dyslipidemia 04/08/2018   Primary osteoarthritis of both hands 04/08/2018   History of depression 03/16/2018   History of hyperlipidemia 03/16/2018   Anxiety 04/02/2016   History of cervical dysplasia 04/02/2016   Hyperlipidemia 04/02/2016    Past Medical History:  Diagnosis Date   Allergic rhinitis    Anxiety 04/02/2016   Chest pressure 05/11/2018   Depression    Dyslipidemia 04/08/2018   Exertional shortness of breath 05/11/2018   Family history of systemic lupus erythematosus (SLE) in mother 04/08/2018   Fibromyalgia    Former smoker 04/08/2018   History of cervical dysplasia 04/02/2016   2013; hysterectomy d/t recurrent dysplasia s/p LEEP   History of depression 03/16/2018   History of hyperlipidemia 03/16/2018   Hypercholesteremia 04/02/2016   Osteoarthritis    Primary osteoarthritis of both hands 04/08/2018   Sjogren's syndrome with keratoconjunctivitis sicca ([facility]) 04/08/2018   +ANA, +Ro, +La, sicca symptoms, fatigue   Trochanteric bursitis, right hip 01/09/2018    Family History  Problem Relation Age of Onset   Lupus Mother    Osteoarthritis Mother    Polymyalgia rheumatica Mother    Osteoarthritis Father    Alzheimer's disease Father    Breast cancer Maternal Grandmother    Past Surgical  History:  Procedure Laterality Date   HYSTEROTOMY     KNEE SURGERY Right    LYMPH NODE BIOPSY     groin    NASAL SEPTUM SURGERY  12/2020   Social History   Social History Narrative   Not on file   Immunization History  Administered Date(s) Administered   Influenza, High Dose Seasonal PF 06/24/2018   Moderna Sars-Covid-2 Vaccination 09/23/2019, 10/20/2019, 06/30/2020      Objective: Vital Signs: BP 100/69 (BP Location: Left Arm, Patient Position: Sitting, Cuff Size: Normal)   Pulse (!) 103   Resp 16   Ht 5' 3.5" (1.613 m)   Wt 170 lb (77.1 kg)   BMI 29.64 kg/m    Physical Exam Vitals and nursing note reviewed.  Constitutional:      Appearance: She is well-developed.  HENT:     Head: Normocephalic and atraumatic.  Eyes:     Conjunctiva/sclera: Conjunctivae normal.  Cardiovascular:     Rate and Rhythm: Normal rate and regular rhythm.     Heart sounds: Normal heart sounds.  Pulmonary:     Effort: Pulmonary effort is normal.     Breath sounds: Normal breath sounds.  Abdominal:     General: Bowel sounds are normal.     Palpations: Abdomen is soft.  Musculoskeletal:     Cervical back: Normal range of motion.  Lymphadenopathy:     Cervical: No cervical adenopathy.  Skin:    General: Skin is warm and dry.     Capillary Refill: Capillary refill takes less than 2 seconds.  Neurological:     Mental Status: She is alert and oriented to person, place, and time.  Psychiatric:        Behavior: Behavior normal.     Musculoskeletal Exam: C-spine was in good range of motion.  Shoulder joints, elbow joints, wrist joints with good range of motion.  She had bilateral PIP and DIP thickening with no synovitis.  She had painful range of motion of her right hip joint.  She had discomfort range of motion of her right knee joint.  No warmth swelling or effusion was noted.  There was no tenderness over ankles or MTPs.  CDAI Exam: CDAI Score: -- Patient Global: --; Provider Global: -- Swollen: --; Tender: -- Joint Exam 01/22/2022   No joint exam has been documented for this visit   There is currently no information documented on the homunculus. Go to the Rheumatology activity and complete the homunculus joint exam.  Investigation: No additional findings.  Imaging: No results found.  Recent Labs: Lab Results  Component Value Date   WBC 3.5 (L)  08/21/2021   HGB 13.5 08/21/2021   PLT 200 08/21/2021   NA 139 08/21/2021   K 4.3 08/21/2021   CL 106 08/21/2021   CO2 26 08/21/2021   GLUCOSE 77 08/21/2021   BUN 18 08/21/2021   CREATININE 0.92 08/21/2021   BILITOT 0.4 08/21/2021   AST 21 08/21/2021   ALT 6 08/21/2021   PROT 7.9 08/21/2021   PROT 7.6 08/21/2021   CALCIUM 10.1 08/21/2021   GFRAA 80 10/24/2020    Speciality Comments: No specialty comments available.  Procedures:  No procedures performed Allergies: Codeine   Assessment / Plan:     Visit Diagnoses: Sjogren's syndrome with keratoconjunctivitis sicca ([facility]) - +ANA, +Ro, +La, +RF, sicca symptoms, fatigue: -She continues to have dry mouth and dry eyes.  Over-the-counter products were discussed at length.  Good oral hygiene was also discussed.  She  denies any shortness of breath or palpitations.  Plan: Urinalysis, Routine w reflex microscopic today.  Labs obtained on March 20, 2021 were reviewed which showed positive rheumatoid factor.  Sed rate was normal and double-stranded ENA was negative.  ANA continues continues to be positive and was 1: 1280NS in December 2022.  Primary osteoarthritis of both hands-she has osteoarthritis in her bilateral hands which causes discomfort.  Joint protection was discussed.  Pain in right hip -she has been experiencing increased right hip joint pain.  She had discomfort range of motion.  X-rays were unremarkable which were obtained today.  X-ray findings were discussed with the patient.  It is possible that she may have early osteoarthritis or pain referred from her right knee joint.  Plan: XR HIP UNILAT W OR W/O PELVIS 2-3 VIEWS RIGHT  Chronic pain of right knee-she has chronic discomfort in the right knee joint.  No warmth swelling or effusion was noted.  Other fatigue -she has been experiencing increased fatigue.  Increased association of Sjogren's with lymphoma was also discussed.  Plan: CBC with Differential/Platelet, COMPLETE METABOLIC  PANEL WITH GFR, Serum protein electrophoresis with reflex  Fibromyalgia-she continues to have generalized pain and discomfort.  Positive tender points were noted.  Need for regular exercise was emphasized.  Osteopenia of multiple sites - DEXA 08/30/2019 which showed T score -1.6.  DEXA ordered by PCP in 2020.  She will have repeat DEXA scan with her PCP.  Family history of systemic lupus erythematosus (SLE) in mother - AVISE index negative   Dyslipidemia  History of depression  Obstructive sleep apnea syndrome  Former smoker  Orders: Orders Placed This Encounter  Procedures   XR HIP UNILAT W OR W/O PELVIS 2-3 VIEWS RIGHT   CBC with Differential/Platelet   COMPLETE METABOLIC PANEL WITH GFR   Urinalysis, Routine w reflex microscopic   Serum protein electrophoresis with reflex   No orders of the defined types were placed in this encounter.   Follow-Up Instructions: Return in about 5 months (around 06/24/2022) for Sjogren's, Osteoarthritis.   Pollyann Savoy, MD  Note - This record has been created using Animal nutritionist.  Chart creation errors have been sought, but may not always  have been located. Such creation errors do not reflect on  the standard of medical care.

## 2022-01-22 ENCOUNTER — Encounter: Payer: Self-pay | Admitting: Rheumatology

## 2022-01-22 ENCOUNTER — Ambulatory Visit: Payer: PPO | Admitting: Rheumatology

## 2022-01-22 ENCOUNTER — Ambulatory Visit (INDEPENDENT_AMBULATORY_CARE_PROVIDER_SITE_OTHER): Payer: PPO

## 2022-01-22 VITALS — BP 100/69 | HR 103 | Resp 16 | Ht 63.5 in | Wt 170.0 lb

## 2022-01-22 DIAGNOSIS — M25551 Pain in right hip: Secondary | ICD-10-CM

## 2022-01-22 DIAGNOSIS — Z8269 Family history of other diseases of the musculoskeletal system and connective tissue: Secondary | ICD-10-CM

## 2022-01-22 DIAGNOSIS — M3501 Sicca syndrome with keratoconjunctivitis: Secondary | ICD-10-CM

## 2022-01-22 DIAGNOSIS — M797 Fibromyalgia: Secondary | ICD-10-CM

## 2022-01-22 DIAGNOSIS — M8589 Other specified disorders of bone density and structure, multiple sites: Secondary | ICD-10-CM

## 2022-01-22 DIAGNOSIS — M19041 Primary osteoarthritis, right hand: Secondary | ICD-10-CM

## 2022-01-22 DIAGNOSIS — M25561 Pain in right knee: Secondary | ICD-10-CM

## 2022-01-22 DIAGNOSIS — Z8659 Personal history of other mental and behavioral disorders: Secondary | ICD-10-CM

## 2022-01-22 DIAGNOSIS — E785 Hyperlipidemia, unspecified: Secondary | ICD-10-CM

## 2022-01-22 DIAGNOSIS — M19042 Primary osteoarthritis, left hand: Secondary | ICD-10-CM

## 2022-01-22 DIAGNOSIS — Z87891 Personal history of nicotine dependence: Secondary | ICD-10-CM

## 2022-01-22 DIAGNOSIS — G4733 Obstructive sleep apnea (adult) (pediatric): Secondary | ICD-10-CM

## 2022-01-22 DIAGNOSIS — G8929 Other chronic pain: Secondary | ICD-10-CM

## 2022-01-22 DIAGNOSIS — R5383 Other fatigue: Secondary | ICD-10-CM

## 2022-01-23 NOTE — Progress Notes (Signed)
White cell count is normal and stable.  CMP is normal.  UA is negative.

## 2022-01-24 LAB — CBC WITH DIFFERENTIAL/PLATELET
Absolute Monocytes: 307 cells/uL (ref 200–950)
Basophils Absolute: 69 cells/uL (ref 0–200)
Basophils Relative: 2.1 %
Eosinophils Absolute: 149 cells/uL (ref 15–500)
Eosinophils Relative: 4.5 %
HCT: 41.5 % (ref 35.0–45.0)
Hemoglobin: 13.6 g/dL (ref 11.7–15.5)
Lymphs Abs: 1066 cells/uL (ref 850–3900)
MCH: 26.9 pg — ABNORMAL LOW (ref 27.0–33.0)
MCHC: 32.8 g/dL (ref 32.0–36.0)
MCV: 82 fL (ref 80.0–100.0)
MPV: 10.6 fL (ref 7.5–12.5)
Monocytes Relative: 9.3 %
Neutro Abs: 1709 cells/uL (ref 1500–7800)
Neutrophils Relative %: 51.8 %
Platelets: 202 10*3/uL (ref 140–400)
RBC: 5.06 10*6/uL (ref 3.80–5.10)
RDW: 13.9 % (ref 11.0–15.0)
Total Lymphocyte: 32.3 %
WBC: 3.3 10*3/uL — ABNORMAL LOW (ref 3.8–10.8)

## 2022-01-24 LAB — COMPLETE METABOLIC PANEL WITH GFR
AG Ratio: 1.3 (calc) (ref 1.0–2.5)
ALT: 5 U/L — ABNORMAL LOW (ref 6–29)
AST: 28 U/L (ref 10–35)
Albumin: 4.4 g/dL (ref 3.6–5.1)
Alkaline phosphatase (APISO): 59 U/L (ref 37–153)
BUN: 19 mg/dL (ref 7–25)
CO2: 26 mmol/L (ref 20–32)
Calcium: 10.4 mg/dL (ref 8.6–10.4)
Chloride: 105 mmol/L (ref 98–110)
Creat: 0.97 mg/dL (ref 0.50–1.05)
Globulin: 3.4 g/dL (calc) (ref 1.9–3.7)
Glucose, Bld: 89 mg/dL (ref 65–99)
Potassium: 4.8 mmol/L (ref 3.5–5.3)
Sodium: 140 mmol/L (ref 135–146)
Total Bilirubin: 0.4 mg/dL (ref 0.2–1.2)
Total Protein: 7.8 g/dL (ref 6.1–8.1)
eGFR: 63 mL/min/{1.73_m2} (ref >=60)

## 2022-01-24 LAB — URINALYSIS, ROUTINE W REFLEX MICROSCOPIC
Bilirubin Urine: NEGATIVE
Glucose, UA: NEGATIVE
Hgb urine dipstick: NEGATIVE
Ketones, ur: NEGATIVE
Leukocytes,Ua: NEGATIVE
Nitrite: NEGATIVE
Protein, ur: NEGATIVE
Specific Gravity, Urine: 1.021 (ref 1.001–1.035)
pH: 5.5 (ref 5.0–8.0)

## 2022-01-24 LAB — PROTEIN ELECTROPHORESIS, SERUM, WITH REFLEX
Albumin ELP: 4.2 g/dL (ref 3.8–4.8)
Alpha 1: 0.3 g/dL (ref 0.2–0.3)
Alpha 2: 0.6 g/dL (ref 0.5–0.9)
Beta 2: 0.4 g/dL (ref 0.2–0.5)
Beta Globulin: 0.4 g/dL (ref 0.4–0.6)
Gamma Globulin: 1.6 g/dL (ref 0.8–1.7)
Total Protein: 7.6 g/dL (ref 6.1–8.1)

## 2022-01-24 NOTE — Progress Notes (Signed)
SPEP is normal.

## 2022-03-28 ENCOUNTER — Encounter: Payer: Self-pay | Admitting: Orthopaedic Surgery

## 2022-03-28 ENCOUNTER — Ambulatory Visit: Payer: PPO | Admitting: Orthopaedic Surgery

## 2022-03-28 VITALS — Ht 63.0 in | Wt 174.0 lb

## 2022-03-28 DIAGNOSIS — M25551 Pain in right hip: Secondary | ICD-10-CM | POA: Diagnosis not present

## 2022-03-28 DIAGNOSIS — M7061 Trochanteric bursitis, right hip: Secondary | ICD-10-CM | POA: Diagnosis not present

## 2022-03-28 HISTORY — DX: Pain in right hip: M25.551

## 2022-03-28 MED ORDER — LIDOCAINE HCL 1 % IJ SOLN
2.0000 mL | INTRAMUSCULAR | Status: AC | PRN
  Administered 2022-03-28: 2 mL

## 2022-03-28 MED ORDER — METHYLPREDNISOLONE ACETATE 40 MG/ML IJ SUSP
80.0000 mg | INTRAMUSCULAR | Status: AC | PRN
  Administered 2022-03-28: 80 mg via INTRA_ARTICULAR

## 2022-03-28 MED ORDER — BUPIVACAINE HCL 0.25 % IJ SOLN
2.0000 mL | INTRAMUSCULAR | Status: AC | PRN
  Administered 2022-03-28: 2 mL via INTRA_ARTICULAR

## 2022-03-28 NOTE — Progress Notes (Signed)
Office Visit Note   Patient: Kaitlyn Hudson           Date of Birth: 06/26/1951           MRN: [id number] Visit Date: 03/28/2022              Requested by: Nonnie Done., MD [address] [place],  [place] [address] PCP: Nonnie Done., MD   Assessment & Plan: Visit Diagnoses:  1. Pain in right hip   2. Trochanteric bursitis, right hip     Plan: Mrs. Buffin has been seen in the past for evaluation of right hip pain.  On each occasion she has had pain along the lateral aspect of her hip consistent with greater trochanteric bursitis.  She has had excellent results with greater trochanteric bursal injections with cortisone.  On this occasion she has had several month history of not only lateral right hip pain but groin discomfort.  She has a history of Sjogren syndrome is being followed by Dr.Deveshwar.  Films of her pelvis were obtained in May which I reviewed and there was a little irregularity along the femoral head but the joint space was well-maintained.  She certainly could have some early arthritis in the joint but I think because of her discomfort is worth obtaining an MRI scan.  I will also proceed with injecting the greater trochanter of the right hip and monitor her response.  If she has complete relief of her pain with a trochanteric injection she can cancel the MRI scan.  She has had a history of back pain but does not feel like her present problem is related to her back  Follow-Up Instructions: Return After MRI scan right hip.   Orders:  Orders Placed This Encounter  Procedures   Large Joint Inj: R greater trochanter   MR Hip Right w/o contrast   No orders of the defined types were placed in this encounter.     Procedures: Large Joint Inj: R greater trochanter on 03/28/2022 2:23 PM Indications: pain and diagnostic evaluation Details: 25 G 1.5 in needle  Arthrogram: No  Medications: 2 mL lidocaine 1 %; 80 mg methylPREDNISolone acetate 40 MG/ML; 2 mL bupivacaine 0.25  % Procedure, treatment alternatives, risks and benefits explained, specific risks discussed. Consent was given by the patient. Immediately prior to procedure a time out was called to verify the correct patient, procedure, equipment, support staff and site/side marked as required. Patient was prepped and draped in the usual sterile fashion.       Clinical Data: No additional findings.   Subjective: Chief Complaint  Patient presents with   Right Hip - New Patient (Initial Visit)   Patient presents today as a new patient for her right hip/groin pain. She states that pain has been ongoing for a few months and has gradually been getting worse. Patient denies having any injuries or falls to have started the flare up, nor has she had any previous right hip surgeries. OTC medications are being used to help manage pain.   Review of Systems   Objective: Vital Signs: Ht 5\' 3"  (1.6 m)   Wt 174 lb (78.9 kg)   BMI 30.82 kg/m   Physical Exam Constitutional:      Appearance: She is well-developed.  Eyes:     Pupils: Pupils are equal, round, and reactive to light.  Pulmonary:     Effort: Pulmonary effort is normal.  Skin:    General: Skin is warm and dry.  Neurological:     Mental Status: She is alert and oriented to person, place, and time.  Psychiatric:        Behavior: Behavior normal.     Ortho Exam awake alert and oriented x3.  Comfortable sitting.  Ambulates without a limp.  Had a little bit of discomfort with internal/external rotation of the right hip and not on the left.  Feels like her hip is just a "little bit tight".  She definitely had some tenderness over the lateral aspect of her hip in the area of the trochanter.  Straight leg raise was negative.  Motor and sensory exam intact.  Had mid groin pain with hip motion and oftentimes with weightbearing but no masses  Specialty Comments:  No specialty comments available.  Imaging: No results found.   PMFS History: Patient  Active Problem List   Diagnosis Date Noted   Pain in right hip 03/28/2022   Trochanteric bursitis, right hip 03/28/2022   CAD in native artery 06/26/2018   Sleep apnea 05/15/2018   Angina pectoris (HCC) 05/15/2018   SOB (shortness of breath) on exertion 05/11/2018   Chest pressure 05/11/2018   Fibromyalgia 05/11/2018   Sjogren's syndrome with keratoconjunctivitis sicca (HCC) 04/08/2018   Former smoker 04/08/2018   Family history of systemic lupus erythematosus (SLE) in mother 04/08/2018   Dyslipidemia 04/08/2018   Primary osteoarthritis of both hands 04/08/2018   History of depression 03/16/2018   History of hyperlipidemia 03/16/2018   Anxiety 04/02/2016   History of cervical dysplasia 04/02/2016   Hyperlipidemia 04/02/2016   Past Medical History:  Diagnosis Date   Allergic rhinitis    Anxiety 04/02/2016   Chest pressure 05/11/2018   Depression    Dyslipidemia 04/08/2018   Exertional shortness of breath 05/11/2018   Family history of systemic lupus erythematosus (SLE) in mother 04/08/2018   Fibromyalgia    Former smoker 04/08/2018   History of cervical dysplasia 04/02/2016   2013; hysterectomy d/t recurrent dysplasia s/p LEEP   History of depression 03/16/2018   History of hyperlipidemia 03/16/2018   Hypercholesteremia 04/02/2016   Osteoarthritis    Primary osteoarthritis of both hands 04/08/2018   Sjogren's syndrome with keratoconjunctivitis sicca (HCC) 04/08/2018   +ANA, +Ro, +La, sicca symptoms, fatigue   Trochanteric bursitis, right hip 01/09/2018    Family History  Problem Relation Age of Onset   Lupus Mother    Osteoarthritis Mother    Polymyalgia rheumatica Mother    Osteoarthritis Father    Alzheimer's disease Father    Breast cancer Maternal Grandmother     Past Surgical History:  Procedure Laterality Date   HYSTEROTOMY     KNEE SURGERY Right    LYMPH NODE BIOPSY     groin    NASAL SEPTUM SURGERY  12/2020   Social History   Occupational History   Not on file  Tobacco  Use   Smoking status: Former    Packs/day: 0.50    Years: 30.00    Total pack years: 15.00    Types: Cigarettes    Quit date: 09/02/2010    Years since quitting: 11.5   Smokeless tobacco: Never  Vaping Use   Vaping Use: Never used  Substance and Sexual Activity   Alcohol use: No   Drug use: Never   Sexual activity: Not on file

## 2022-04-09 ENCOUNTER — Telehealth: Payer: Self-pay | Admitting: Orthopaedic Surgery

## 2022-04-09 NOTE — Telephone Encounter (Signed)
Called patient left message to return call to schedule an appointment for MRI review with Dr. Cleophas Dunker or [place]

## 2022-04-10 ENCOUNTER — Ambulatory Visit
Admission: RE | Admit: 2022-04-10 | Discharge: 2022-04-10 | Disposition: A | Discharge status: Home / Self Care | Payer: PPO | Source: Ambulatory Visit | Attending: Orthopaedic Surgery | Admitting: Orthopaedic Surgery

## 2022-04-10 DIAGNOSIS — M25551 Pain in right hip: Secondary | ICD-10-CM

## 2022-04-12 ENCOUNTER — Ambulatory Visit: Payer: PPO | Admitting: Physician Assistant

## 2022-04-12 ENCOUNTER — Encounter: Payer: Self-pay | Admitting: Physician Assistant

## 2022-04-12 DIAGNOSIS — M25551 Pain in right hip: Secondary | ICD-10-CM | POA: Diagnosis not present

## 2022-04-12 NOTE — Progress Notes (Signed)
Office Visit Note   Patient: Kaitlyn Hudson           Date of Birth: [birth year]           MRN: [id number] Visit Date: 04/12/2022              Requested by: Nonnie Done., MD [address] [place],  [place] [address] PCP: Nonnie Done., MD  Chief Complaint  Patient presents with   Right Hip - Follow-up      HPI: Kaitlyn Hudson is a very pleasant 71 year old woman with ongoing right hip pain.  She is a patient of Dr. Cleophas Dunker.  She does have history of trochanteric bursitis and is responded to this.  Unfortunately her last couple injections have not been helpful.  She did have an injection last time she was here and while it helped her trope bursitis she still complains of groin pain.  X-rays were done which showed early arthritic changes of the hip.  Dr. Cleophas Dunker recommended an MRI and she is here to review the results  Assessment & Plan: Visit Diagnoses:  1. Pain in right hip     Plan: MRI was reviewed with her.  She has mild right hip arthritis.  She also has moderate degenerative changes of the pubic symphysis.  She does have pain with range of motion of her hip and she does have an effusion.  We will try a hip injection first.  We will follow-up with Dr. Cleophas Dunker after that.  If she did not get relief with the hip injection her pain could be coming from the moderate degenerative changes in her pubic symphysis  Follow-Up Instructions: No follow-ups on file.   Ortho Exam  Patient is alert, oriented, no adenopathy, well-dressed, normal affect, normal respiratory effort. Examination of her right hip she has pain with internal rotation.  No pain laterally.  Pain is reproduced in her groin  Imaging: No results found. No images are attached to the encounter.  Labs: Lab Results  Component Value Date   ESRSEDRATE 25 08/21/2021   ESRSEDRATE 19 03/20/2021   ESRSEDRATE 19 10/24/2020     No results found for: "ALBUMIN", "PREALBUMIN", "CBC"  No results found for: "MG" No  results found for: "VD25OH"  No results found for: "PREALBUMIN"    Latest Ref Rng & Units 01/22/2022    3:08 PM 08/21/2021    2:16 PM 03/20/2021    2:30 PM  CBC EXTENDED  WBC 3.8 - 10.8 Thousand/uL 3.3  3.5  3.8   RBC 3.80 - 5.10 Million/uL 5.06  5.09  4.91   Hemoglobin 11.7 - 15.5 g/dL 02.5  42.7  06.2   HCT 35.0 - 45.0 % 41.5  42.1  40.7   Platelets 140 - 400 Thousand/uL 202  200  203   NEUT# 1,500 - 7,800 cells/uL 1,709  2,027  2,234   Lymph# 850 - 3,900 cells/uL 1,066  1,026  1,064      There is no height or weight on file to calculate BMI.  Orders:  Orders Placed This Encounter  Procedures   Ambulatory referral to Orthopedic Surgery   No orders of the defined types were placed in this encounter.    Procedures: No procedures performed  Clinical Data: No additional findings.  ROS:  All other systems negative, except as noted in the HPI. Review of Systems  Objective: Vital Signs: There were no vitals taken for this visit.  Specialty Comments:  No specialty comments available.  PMFS History: Patient Active Problem List   Diagnosis Date Noted   Pain in right hip 03/28/2022   Trochanteric bursitis, right hip 03/28/2022   CAD in native artery 06/26/2018   Sleep apnea 05/15/2018   Angina pectoris (HCC) 05/15/2018   SOB (shortness of breath) on exertion 05/11/2018   Chest pressure 05/11/2018   Fibromyalgia 05/11/2018   Sjogren's syndrome with keratoconjunctivitis sicca (HCC) 04/08/2018   Former smoker 04/08/2018   Family history of systemic lupus erythematosus (SLE) in mother 04/08/2018   Dyslipidemia 04/08/2018   Primary osteoarthritis of both hands 04/08/2018   History of depression 03/16/2018   History of hyperlipidemia 03/16/2018   Anxiety 04/02/2016   History of cervical dysplasia 04/02/2016   Hyperlipidemia 04/02/2016   Past Medical History:  Diagnosis Date   Allergic rhinitis    Anxiety 04/02/2016   Chest pressure 05/11/2018   Depression     Dyslipidemia 04/08/2018   Exertional shortness of breath 05/11/2018   Family history of systemic lupus erythematosus (SLE) in mother 04/08/2018   Fibromyalgia    Former smoker 04/08/2018   History of cervical dysplasia 04/02/2016   2013; hysterectomy d/t recurrent dysplasia s/p LEEP   History of depression 03/16/2018   History of hyperlipidemia 03/16/2018   Hypercholesteremia 04/02/2016   Osteoarthritis    Primary osteoarthritis of both hands 04/08/2018   Sjogren's syndrome with keratoconjunctivitis sicca (HCC) 04/08/2018   +ANA, +Ro, +La, sicca symptoms, fatigue   Trochanteric bursitis, right hip 01/09/2018    Family History  Problem Relation Age of Onset   Lupus Mother    Osteoarthritis Mother    Polymyalgia rheumatica Mother    Osteoarthritis Father    Alzheimer's disease Father    Breast cancer Maternal Grandmother     Past Surgical History:  Procedure Laterality Date   HYSTEROTOMY     KNEE SURGERY Right    LYMPH NODE BIOPSY     groin    NASAL SEPTUM SURGERY  12/2020   Social History   Occupational History   Not on file  Tobacco Use   Smoking status: Former    Packs/day: 0.50    Years: 30.00    Total pack years: 15.00    Types: Cigarettes    Quit date: 09/02/2010    Years since quitting: 11.6   Smokeless tobacco: Never  Vaping Use   Vaping Use: Never used  Substance and Sexual Activity   Alcohol use: No   Drug use: Never   Sexual activity: Not on file

## 2022-04-19 ENCOUNTER — Ambulatory Visit: Payer: PPO | Admitting: Sports Medicine

## 2022-04-19 ENCOUNTER — Ambulatory Visit: Payer: Self-pay

## 2022-04-19 ENCOUNTER — Encounter: Payer: Self-pay | Admitting: Sports Medicine

## 2022-04-19 DIAGNOSIS — M25551 Pain in right hip: Secondary | ICD-10-CM | POA: Diagnosis not present

## 2022-04-19 NOTE — Progress Notes (Signed)
   Procedure Note  Patient: Kaitlyn Hudson             Date of Birth: 07/19/1951           MRN: [id number]             Visit Date: 04/19/2022  Subjective: "Kaitlyn Hudson" is a very pleasant 71 year old female who presents today for ultrasound-guided hip injection.  She has had trochanteric bursitis that have been injected previously which helped her lateral hip pain, but she persists with in her hip/groin pain.  Procedures: Visit Diagnoses:  1. Pain in right hip     Procedure: Intra-articular hip injection, right After discussion on risks/benefits/indications and informed verbal consent was obtained, a timeout was performed. Patient was lying supine on exam table. The hip was cleaned with betadine and alcohol swab. Then utilizing ultrasound guidance, the patient's femoral head and neck junction was identified and subsequently injected with 4:2 lidocaine:depomedrol via an in-plane approach with ultrasound visualization of the injectate administered into the hip joint. Patient tolerated procedure well without immediate complications.   Right hip Injection^  I evaluated and move the hip around about 10 minutes after injection and patient had significant pain relief at that time.  She may follow-up with Clerance Lav and Dr. Cleophas Dunker as needed.  Madelyn Brunner, DO Sports Medicine Physician - Orth[facility]   This note was dictated using Dragon naturally speaking software and may contain errors in syntax, spelling, or content which have not been identified prior to signing this note.

## 2022-05-07 ENCOUNTER — Encounter: Payer: Self-pay | Admitting: Orthopaedic Surgery

## 2022-05-07 ENCOUNTER — Ambulatory Visit: Payer: PPO | Admitting: Orthopaedic Surgery

## 2022-05-07 ENCOUNTER — Ambulatory Visit (INDEPENDENT_AMBULATORY_CARE_PROVIDER_SITE_OTHER): Payer: PPO

## 2022-05-07 VITALS — Ht 63.5 in | Wt 174.0 lb

## 2022-05-07 DIAGNOSIS — G8929 Other chronic pain: Secondary | ICD-10-CM

## 2022-05-07 DIAGNOSIS — M25562 Pain in left knee: Secondary | ICD-10-CM | POA: Diagnosis not present

## 2022-05-07 DIAGNOSIS — S83282A Other tear of lateral meniscus, current injury, left knee, initial encounter: Secondary | ICD-10-CM | POA: Diagnosis not present

## 2022-05-07 NOTE — Progress Notes (Signed)
Office Visit Note   Patient: Kaitlyn Hudson           Date of Birth: 28-Jan-1951           MRN: [id number] Visit Date: 05/07/2022              Requested by: Nonnie Done., MD [address] [place],  [place] [address] PCP: Nonnie Done., MD   Assessment & Plan: Visit Diagnoses:  1. Chronic pain of left knee   2. Acute lateral meniscus tear of left knee, initial encounter     Plan: [name] is a pleasant 71 year old woman who presents with a 1 month history of 5 episodes of acute locking of her left knee.  First time was when she had her legs crossed and she had excruciating pain.  When it locks it usually when her knee is bent.  She is able to manipulate her foot and can feel a pop and the pain resolves.  The pain is most focally over the posterior lateral knee joint.  Given the mechanical symptoms of popping with pain and locking.  Concern for lateral meniscus tear.  Have recommended an MRI.  Of note she recently had an ultrasound-guided intra-articular injection of the right hip for some mild arthritis.  She has experienced good relief from her groin pain.  Follow-Up Instructions: Return After MRI.   Orders:  Orders Placed This Encounter  Procedures   XR KNEE 3 VIEW LEFT   MR Knee Left w/o contrast   No orders of the defined types were placed in this encounter.     Procedures: No procedures performed   Clinical Data: No additional findings.   Subjective: Chief Complaint  Patient presents with   Left Knee - New Patient (Initial Visit)   Patient presents today as a new patient for her left knee pian. She states that she has been having ongoing locking pains for a while. She states that locking is noticed more while her knee is partially bent and while she has been breaking in her car. Patient denies having any injuries or falls to have started the flare up nor has she had any previous surgeries on her left knee.   Review of Systems  All other systems reviewed and  are negative.    Objective: Vital Signs: Ht 5' 3.5" (1.613 m)   Wt 174 lb (78.9 kg)   BMI 30.34 kg/m   Physical Exam Constitutional:      Appearance: Normal appearance.  Pulmonary:     Effort: Pulmonary effort is normal.  Skin:    General: Skin is warm and dry.  Neurological:     Mental Status: She is alert.     Ortho Exam Examination of her left knee no warmth no effusion no redness no soft tissue swelling.  She does have tenderness especially with flexion over the posterior lateral joint line.  Mild crepitus over the patellofemoral joint with range of motion.  Good varus valgus stability.  Good anterior draw with a good endpoint.  No medial joint line tenderness Specialty Comments:  No specialty comments available.  Imaging: XR KNEE 3 VIEW LEFT  Result Date: 05/07/2022 Three-view x-rays of her left knee were obtained today.  She has overall well-preserved joint spacing both medially and laterally.  As well as patellofemoral.  No periarticular osteophytes noted.Subchondral sclerosis or cystic changes.  On AP view has noted bone-on-bone loss of joint space on the right knee.  No other osseous abnormalities  no fracture    PMFS History: Patient Active Problem List   Diagnosis Date Noted   Acute lateral meniscus tear of left knee 05/07/2022   Pain in right hip 03/28/2022   Trochanteric bursitis, right hip 03/28/2022   CAD in native artery 06/26/2018   Sleep apnea 05/15/2018   Angina pectoris (HCC) 05/15/2018   SOB (shortness of breath) on exertion 05/11/2018   Chest pressure 05/11/2018   Fibromyalgia 05/11/2018   Sjogren's syndrome with keratoconjunctivitis sicca (HCC) 04/08/2018   Former smoker 04/08/2018   Family history of systemic lupus erythematosus (SLE) in mother 04/08/2018   Dyslipidemia 04/08/2018   Primary osteoarthritis of both hands 04/08/2018   History of depression 03/16/2018   History of hyperlipidemia 03/16/2018   Anxiety 04/02/2016   History of  cervical dysplasia 04/02/2016   Hyperlipidemia 04/02/2016   Past Medical History:  Diagnosis Date   Allergic rhinitis    Anxiety 04/02/2016   Chest pressure 05/11/2018   Depression    Dyslipidemia 04/08/2018   Exertional shortness of breath 05/11/2018   Family history of systemic lupus erythematosus (SLE) in mother 04/08/2018   Fibromyalgia    Former smoker 04/08/2018   History of cervical dysplasia 04/02/2016   2013; hysterectomy d/t recurrent dysplasia s/p LEEP   History of depression 03/16/2018   History of hyperlipidemia 03/16/2018   Hypercholesteremia 04/02/2016   Osteoarthritis    Primary osteoarthritis of both hands 04/08/2018   Sjogren's syndrome with keratoconjunctivitis sicca (HCC) 04/08/2018   +ANA, +Ro, +La, sicca symptoms, fatigue   Trochanteric bursitis, right hip 01/09/2018    Family History  Problem Relation Age of Onset   Lupus Mother    Osteoarthritis Mother    Polymyalgia rheumatica Mother    Osteoarthritis Father    Alzheimer's disease Father    Breast cancer Maternal Grandmother     Past Surgical History:  Procedure Laterality Date   HYSTEROTOMY     KNEE SURGERY Right    LYMPH NODE BIOPSY     groin    NASAL SEPTUM SURGERY  12/2020   Social History   Occupational History   Not on file  Tobacco Use   Smoking status: Former    Packs/day: 0.50    Years: 30.00    Total pack years: 15.00    Types: Cigarettes    Quit date: 09/02/2010    Years since quitting: 11.6   Smokeless tobacco: Never  Vaping Use   Vaping Use: Never used  Substance and Sexual Activity   Alcohol use: No   Drug use: Never   Sexual activity: Not on file

## 2022-05-11 ENCOUNTER — Ambulatory Visit
Admission: RE | Admit: 2022-05-11 | Discharge: 2022-05-11 | Disposition: A | Discharge status: Home / Self Care | Payer: PPO | Source: Ambulatory Visit | Attending: Orthopaedic Surgery | Admitting: Orthopaedic Surgery

## 2022-05-11 DIAGNOSIS — G8929 Other chronic pain: Secondary | ICD-10-CM

## 2022-05-17 ENCOUNTER — Telehealth: Payer: Self-pay | Admitting: Orthopaedic Surgery

## 2022-05-17 NOTE — Telephone Encounter (Signed)
Scheduled

## 2022-05-17 NOTE — Telephone Encounter (Signed)
Pt called requesting a call back if possible to read MRI results over the phone . Pt phone number is [phone].

## 2022-05-23 ENCOUNTER — Ambulatory Visit: Payer: PPO | Admitting: Orthopaedic Surgery

## 2022-05-23 ENCOUNTER — Encounter: Payer: Self-pay | Admitting: Orthopaedic Surgery

## 2022-05-23 DIAGNOSIS — M1712 Unilateral primary osteoarthritis, left knee: Secondary | ICD-10-CM

## 2022-05-23 HISTORY — DX: Unilateral primary osteoarthritis, left knee: M17.12

## 2022-05-23 MED ORDER — METHYLPREDNISOLONE ACETATE 40 MG/ML IJ SUSP
80.0000 mg | INTRAMUSCULAR | Status: AC | PRN
  Administered 2022-05-23: 80 mg via INTRA_ARTICULAR

## 2022-05-23 MED ORDER — LIDOCAINE HCL 1 % IJ SOLN
2.0000 mL | INTRAMUSCULAR | Status: AC | PRN
  Administered 2022-05-23: 2 mL

## 2022-05-23 MED ORDER — BUPIVACAINE HCL 0.25 % IJ SOLN
2.0000 mL | INTRAMUSCULAR | Status: AC | PRN
  Administered 2022-05-23: 2 mL via INTRA_ARTICULAR

## 2022-05-23 NOTE — Progress Notes (Signed)
Office Visit Note   Patient: Kaitlyn Hudson           Date of Birth: 08-30-1951           MRN: [id number] Visit Date: 05/23/2022              Requested by: Enid Skeens., MD [address],  [place] [address] PCP: Enid Skeens., MD   Assessment & Plan: Visit Diagnoses:  1. Unilateral primary osteoarthritis, left knee     Plan: Kaitlyn Hudson comes in today to review her MRI of her left knee.  She has pain especially underneath her kneecap.  A little bit on the inside.  MRI was reviewed with her today.  She does have some mild tricompartmental degenerative changes the menisci cruciate and collateral ligaments are all intact.  Went over the natural history of arthritis.  Suggested Voltaren gel.  Also suggested a cortisone injection today.  She would like to go forward with that.  Follow-Up Instructions: Return if symptoms worsen or fail to improve.   Orders:  Orders Placed This Encounter  Procedures   Large Joint Inj: L knee   No orders of the defined types were placed in this encounter.     Procedures: Large Joint Inj: L knee on 05/23/2022 4:24 PM Indications: pain and diagnostic evaluation Details: 25 G 1.5 in needle, anterolateral approach  Arthrogram: No  Medications: 80 mg methylPREDNISolone acetate 40 MG/ML; 2 mL lidocaine 1 %; 2 mL bupivacaine 0.25 % Outcome: tolerated well, no immediate complications Procedure, treatment alternatives, risks and benefits explained, specific risks discussed. Consent was given by the patient.      Clinical Data: No additional findings.   Subjective: Chief Complaint  Patient presents with   Left Knee - Pain  Returns for evaluation of her left knee and, specifically, to review the MRI scan of her left knee  HPI  Review of Systems  All other systems reviewed and are negative.    Objective: Vital Signs: There were no vitals taken for this visit.  Physical Exam Constitutional:      Appearance: Normal appearance.   Pulmonary:     Effort: Pulmonary effort is normal.  Neurological:     General: No focal deficit present.     Mental Status: She is alert.     Ortho Exam Examination of her left knee no effusion no redness no warmth.  She does have crepitus with range of motion she has some tenderness over the lateral and medial joint line.  Good varus valgus stability distal compartments are soft nontender Specialty Comments:  No specialty comments available.  Imaging: No results found.   PMFS History: Patient Active Problem List   Diagnosis Date Noted   Unilateral primary osteoarthritis, left knee 05/23/2022   Acute lateral meniscus tear of left knee 05/07/2022   Pain in right hip 03/28/2022   Trochanteric bursitis, right hip 03/28/2022   CAD in native artery 06/26/2018   Sleep apnea 05/15/2018   Angina pectoris ([place]) 05/15/2018   SOB (shortness of breath) on exertion 05/11/2018   Chest pressure 05/11/2018   Fibromyalgia 05/11/2018   Sjogren's syndrome with keratoconjunctivitis sicca ([place]) 04/08/2018   Former smoker 04/08/2018   Family history of systemic lupus erythematosus (SLE) in mother 04/08/2018   Dyslipidemia 04/08/2018   Primary osteoarthritis of both hands 04/08/2018   History of depression 03/16/2018   History of hyperlipidemia 03/16/2018   Anxiety 04/02/2016   History of cervical dysplasia 04/02/2016  Hyperlipidemia 04/02/2016   Past Medical History:  Diagnosis Date   Allergic rhinitis    Anxiety 04/02/2016   Chest pressure 05/11/2018   Depression    Dyslipidemia 04/08/2018   Exertional shortness of breath 05/11/2018   Family history of systemic lupus erythematosus (SLE) in mother 04/08/2018   Fibromyalgia    Former smoker 04/08/2018   History of cervical dysplasia 04/02/2016   2013; hysterectomy d/t recurrent dysplasia s/p LEEP   History of depression 03/16/2018   History of hyperlipidemia 03/16/2018   Hypercholesteremia 04/02/2016   Osteoarthritis    Primary osteoarthritis of  both hands 04/08/2018   Sjogren's syndrome with keratoconjunctivitis sicca (HCC) 04/08/2018   +ANA, +Ro, +La, sicca symptoms, fatigue   Trochanteric bursitis, right hip 01/09/2018    Family History  Problem Relation Age of Onset   Lupus Mother    Osteoarthritis Mother    Polymyalgia rheumatica Mother    Osteoarthritis Father    Alzheimer's disease Father    Breast cancer Maternal Grandmother     Past Surgical History:  Procedure Laterality Date   HYSTEROTOMY     KNEE SURGERY Right    LYMPH NODE BIOPSY     groin    NASAL SEPTUM SURGERY  12/2020   Social History   Occupational History   Not on file  Tobacco Use   Smoking status: Former    Packs/day: 0.50    Years: 30.00    Total pack years: 15.00    Types: Cigarettes    Quit date: 09/02/2010    Years since quitting: 11.7   Smokeless tobacco: Never  Vaping Use   Vaping Use: Never used  Substance and Sexual Activity   Alcohol use: No   Drug use: Never   Sexual activity: Not on file

## 2022-06-04 NOTE — Progress Notes (Signed)
Office Visit Note  Patient: Kaitlyn Hudson             Date of Birth: 06/27/1951           MRN: [id number]             PCP: Nonnie Done., MD Referring: Nonnie Done., MD Visit Date: 06/18/2022 Occupation: @GUAROCC @  Subjective:  Hair loss and nail changes   History of Present Illness: Kaitlyn Hudson is a 71 y.o. female with history of Sjogren syndrome, osteoarthritis, and fibromyalgia.  Patient is not currently taking any immunosuppressive agents.  Patient reports since her last follow-up visit she has noticed increased hair loss as well as fingernail changes.  She states the hair loss has progressively been worsening over the past 1 year.  She has not tried taking any over-the-counter supplements yet.  She has an upcoming appointment with her dermatologist next week. Patient reports that she continues to have chronic sicca symptoms.  She has noticed a slight progression in her symptoms.  She has a prescription for Restasis as needed for dry eyes.  She states that she notices burning in her eyes with the use of Restasis that she has been using refresh eyedrops for breakthrough symptoms.  Patient reports that she is having bilateral cataract surgery in November 2023.  She will be following up with her ophthalmologist afterward and plans on discussing the increased symptoms of dry eyes.  She has also been noticing increased mouth dryness.  She has not tried any over-the-counter products yet.  She tries to drink water throughout the day.  She sees the dentist every 6 months and denies any recent dental caries.  She denies any swollen lymph nodes.  She denies any parotid swelling or tenderness.  She denies any shortness of breath or new pulmonary symptoms. She continues to experience intermittent pain and stiffness in both hands due to ongoing osteoarthritis.  She remains under the care of Dr. December 2023 for management of right hip and left knee joint pain.  She had a left knee joint cortisone  injection on 05/23/2022 and a right trochanteric bursa injection on 03/28/2022, which have provided significant relief.    Activities of Daily Living:  Patient reports morning stiffness for 30 minutes.   Patient Reports nocturnal pain.  Difficulty dressing/grooming: Denies Difficulty climbing stairs: Denies Difficulty getting out of chair: Denies Difficulty using hands for taps, buttons, cutlery, and/or writing: Reports  Review of Systems  Constitutional:  Positive for fatigue.  HENT:  Positive for mouth dryness. Negative for mouth sores.   Eyes:  Positive for dryness.  Respiratory:  Negative for shortness of breath.   Cardiovascular:  Negative for chest pain and palpitations.  Gastrointestinal:  Negative for blood in stool, constipation and diarrhea.  Endocrine: Negative for increased urination.  Genitourinary:  Negative for involuntary urination.  Musculoskeletal:  Positive for joint pain, joint pain, joint swelling, myalgias, morning stiffness, muscle tenderness and myalgias. Negative for gait problem and muscle weakness.  Skin:  Positive for hair loss. Negative for color change, rash and sensitivity to sunlight.  Allergic/Immunologic: Negative for susceptible to infections.  Neurological:  Negative for dizziness and headaches.  Hematological:  Negative for swollen glands.  Psychiatric/Behavioral:  Positive for depressed mood and sleep disturbance. The patient is nervous/anxious.     PMFS History:  Patient Active Problem List   Diagnosis Date Noted   Unilateral primary osteoarthritis, left knee 05/23/2022   Acute lateral meniscus tear of left knee 05/07/2022  Pain in right hip 03/28/2022   Trochanteric bursitis, right hip 03/28/2022   CAD in native artery 06/26/2018   Sleep apnea 05/15/2018   Angina pectoris (HCC) 05/15/2018   SOB (shortness of breath) on exertion 05/11/2018   Chest pressure 05/11/2018   Fibromyalgia 05/11/2018   Sjogren's syndrome with keratoconjunctivitis  sicca (HCC) 04/08/2018   Former smoker 04/08/2018   Family history of systemic lupus erythematosus (SLE) in mother 04/08/2018   Dyslipidemia 04/08/2018   Primary osteoarthritis of both hands 04/08/2018   History of depression 03/16/2018   History of hyperlipidemia 03/16/2018   Anxiety 04/02/2016   History of cervical dysplasia 04/02/2016   Hyperlipidemia 04/02/2016    Past Medical History:  Diagnosis Date   Allergic rhinitis    Anxiety 04/02/2016   Chest pressure 05/11/2018   Depression    Dyslipidemia 04/08/2018   Exertional shortness of breath 05/11/2018   Family history of systemic lupus erythematosus (SLE) in mother 04/08/2018   Fibromyalgia    Former smoker 04/08/2018   History of cervical dysplasia 04/02/2016   2013; hysterectomy d/t recurrent dysplasia s/p LEEP   History of depression 03/16/2018   History of hyperlipidemia 03/16/2018   Hypercholesteremia 04/02/2016   Osteoarthritis    Primary osteoarthritis of both hands 04/08/2018   Sjogren's syndrome with keratoconjunctivitis sicca (HCC) 04/08/2018   +ANA, +Ro, +La, sicca symptoms, fatigue   Trochanteric bursitis, right hip 01/09/2018    Family History  Problem Relation Age of Onset   Lupus Mother    Osteoarthritis Mother    Polymyalgia rheumatica Mother    Osteoarthritis Father    Alzheimer's disease Father    Breast cancer Maternal Grandmother    Past Surgical History:  Procedure Laterality Date   HYSTEROTOMY     KNEE SURGERY Right    LYMPH NODE BIOPSY     groin    NASAL SEPTUM SURGERY  12/2020   Social History   Social History Narrative   Not on file   Immunization History  Administered Date(s) Administered   Influenza, High Dose Seasonal PF 06/24/2018   Moderna Sars-Covid-2 Vaccination 09/23/2019, 10/20/2019, 06/30/2020     Objective: Vital Signs: BP 112/62 (BP Location: Left Arm, Patient Position: Sitting, Cuff Size: Normal)   Pulse 73   Resp 16   Ht 5' 3.5" (1.613 m)   Wt 179 lb 9.6 oz (81.5 kg)   BMI 31.32  kg/m    Physical Exam Vitals and nursing note reviewed.  Constitutional:      Appearance: She is well-developed.  HENT:     Head: Normocephalic and atraumatic.  Eyes:     Conjunctiva/sclera: Conjunctivae normal.  Cardiovascular:     Rate and Rhythm: Normal rate and regular rhythm.     Heart sounds: Normal heart sounds.  Pulmonary:     Effort: Pulmonary effort is normal.     Breath sounds: Normal breath sounds.  Abdominal:     General: Bowel sounds are normal.     Palpations: Abdomen is soft.  Musculoskeletal:     Cervical back: Normal range of motion.  Skin:    General: Skin is warm and dry.     Capillary Refill: Capillary refill takes less than 2 seconds.     Comments: Longitudinal fingernail ridging and splitting noted.  No fingernail pitting noted. Hair thinning noted on superior aspect of scalp.   Neurological:     Mental Status: She is alert and oriented to person, place, and time.  Psychiatric:  Behavior: Behavior normal.      Musculoskeletal Exam: C-spine has good range of motion with no discomfort.  Trapezius muscle tension tenderness bilaterally.  No midline spinal tenderness or SI joint tenderness upon palpation.  Shoulder joints, elbow joints, wrist joints, MCPs, PIPs, DIPs have good range of motion with no synovitis.  PIP and DIP thickening consistent with osteoarthritis of both hands.  Hip joints have good range of motion with no groin pain.  Mild tenderness over the right trochanteric bursa noted.  Knee joints have good range of motion with no warmth or effusion.  Ankle joints have good range of motion with no tenderness or joint swelling.  CDAI Exam: CDAI Score: -- Patient Global: --; Provider Global: -- Swollen: --; Tender: -- Joint Exam 06/18/2022   No joint exam has been documented for this visit   There is currently no information documented on the homunculus. Go to the Rheumatology activity and complete the homunculus joint  exam.  Investigation: No additional findings.  Imaging: No results found.  Recent Labs: Lab Results  Component Value Date   WBC 3.3 (L) 01/22/2022   HGB 13.6 01/22/2022   PLT 202 01/22/2022   NA 140 01/22/2022   K 4.8 01/22/2022   CL 105 01/22/2022   CO2 26 01/22/2022   GLUCOSE 89 01/22/2022   BUN 19 01/22/2022   CREATININE 0.97 01/22/2022   BILITOT 0.4 01/22/2022   AST 28 01/22/2022   ALT 5 (L) 01/22/2022   PROT 7.8 01/22/2022   PROT 7.6 01/22/2022   CALCIUM 10.4 01/22/2022   GFRAA 80 10/24/2020    Speciality Comments: No specialty comments available.  Procedures:  No procedures performed Allergies: Codeine    Assessment / Plan:     Visit Diagnoses: Sjogren's syndrome with keratoconjunctivitis sicca (HCC) - +ANA, +Ro, +La, +RF, sicca symptoms, fatigue:  Patient has noticed a slight increase in sicca symptoms over the past several months.  She has a prescription for Restasis but has noticed increased burning in her eyes after use.  She has been using refresh more frequently for breakthrough symptoms.  She has upcoming cataract surgery in November 2023 for both eyes and afterwards plans on following up with her ophthalmologist to further discuss her symptoms of dry eyes.  She is also noticed some increased mouth dryness.  She has been drinking water throughout the day and has not yet tried any over-the-counter products.  Discussed the use of over-the-counter products including Biotene as well as Tylenol.  I also discussed the importance of seeing her dentist every 6 months and practicing good oral hygiene. No parotid swelling or tenderness.  No cervical lymphadenopathy.  Discussed the 4-14 fold increased risk for lymphoma in patients with Sjogren's syndrome.  I also discussed the increased risk for ILD in patients with Sjogren's syndrome.  On examination her lungs were clear to auscultation.  She was advised to notify us if she develops any new or worsening pulmonary  symptoms. She has no signs or symptoms of inflammatory arthritis at this time.  No synovitis was noted on examination today.  She does not require immunosuppressive therapy.  She was advised to notify us if she develops any new or worsening symptoms.   SPEP did not reveal any abnormal protein bands on 01/22/22. The following lab work will be obtained today for further evaluation.  She will follow-up in the office in 5 months or sooner if needed.  - Plan: CBC with Differential/Platelet, COMPLETE METABOLIC PANEL WITH GFR, ANA, Urinalysis, Routine  w reflex microscopic, Sedimentation rate, C3 and C4, Serum protein electrophoresis with reflex, Rheumatoid factor, Sjogrens syndrome-B extractable nuclear antibody, Sjogrens syndrome-A extractable nuclear antibody  Primary osteoarthritis of both hands: She has PIP and DIP thickening consistent with osteoarthritis of both hands.  No inflammation noted on examination today.  Complete fist formation bilaterally.  Discussed the importance of joint protection and muscle strengthening.  Pain in right hip - X-rays were unremarkable.  Under the care of Dr. Durward Fortes.  She had a right trochanteric bursa cortisone injection on 03/28/2022.  Chronic pain of left knee: X-rays of the left knee were obtained on 05/07/2022.  MRI of the left knee was performed on 05/11/2022: Mild tricompartmental degenerative change. She had a left knee joint cortisone injection performed on 05/23/2022 which has provided significant relief.  On examination she has good range of motion of the left knee joint with no warmth or effusion.  Other fatigue: Chronic, stable.  Fibromyalgia: She continues to experience intermittent myalgias and muscle tenderness due to fibromyalgia.  During flares the fatigue is the most prominent symptom.  Discussed the importance of regular exercise and good sleep hygiene.  Osteopenia of multiple sites - DEXA 08/30/2019 which showed T score -1.6.  DEXA ordered by PCP in  2020.  Patient thinks she has had an updated bone density since then.  She will follow-up with her PCP to further discuss when her next bone density will be due.  Hair loss: Patient has noticed progressively worsening hair loss over the past 6 to 8 months.  No identifiable trigger.  No changes in medication.  She has also noticed some fingernail changes especially fingernail clubbing.   She has not yet tried biotin or nutrafol for hair loss.  Discussed that she could try biotin available over-the-counter and if she continues to have increased hair loss she could try switching to nutrafol  She has an upcoming appointment with her dermatologist next week and plans on further discussing at that time.  Family history of systemic lupus erythematosus (SLE) in mother - AVISE index negative   Other medical conditions are listed as follows:  History of depression  Dyslipidemia  Obstructive sleep apnea syndrome  Former smoker  Orders: Orders Placed This Encounter  Procedures   CBC with Differential/Platelet   COMPLETE METABOLIC PANEL WITH GFR   ANA   Urinalysis, Routine w reflex microscopic   Sedimentation rate   C3 and C4   Serum protein electrophoresis with reflex   Rheumatoid factor   Sjogrens syndrome-B extractable nuclear antibody   Sjogrens syndrome-A extractable nuclear antibody   No orders of the defined types were placed in this encounter.     Follow-Up Instructions: Return in about 5 months (around 11/17/2022) for Sjogren's syndrome, Osteoarthritis, Fibromyalgia.   Ofilia Neas, PA-C  Note - This record has been created using Dragon software.  Chart creation errors have been sought, but may not always  have been located. Such creation errors do not reflect on  the standard of medical care.

## 2022-06-18 ENCOUNTER — Ambulatory Visit: Payer: PPO | Attending: Physician Assistant | Admitting: Physician Assistant

## 2022-06-18 ENCOUNTER — Encounter: Payer: Self-pay | Admitting: Physician Assistant

## 2022-06-18 VITALS — BP 112/62 | HR 73 | Resp 16 | Ht 63.5 in | Wt 179.6 lb

## 2022-06-18 DIAGNOSIS — M25562 Pain in left knee: Secondary | ICD-10-CM

## 2022-06-18 DIAGNOSIS — M3501 Sicca syndrome with keratoconjunctivitis: Secondary | ICD-10-CM | POA: Diagnosis not present

## 2022-06-18 DIAGNOSIS — Z87891 Personal history of nicotine dependence: Secondary | ICD-10-CM

## 2022-06-18 DIAGNOSIS — M8589 Other specified disorders of bone density and structure, multiple sites: Secondary | ICD-10-CM

## 2022-06-18 DIAGNOSIS — M19041 Primary osteoarthritis, right hand: Secondary | ICD-10-CM | POA: Diagnosis not present

## 2022-06-18 DIAGNOSIS — R5383 Other fatigue: Secondary | ICD-10-CM

## 2022-06-18 DIAGNOSIS — M25551 Pain in right hip: Secondary | ICD-10-CM | POA: Diagnosis not present

## 2022-06-18 DIAGNOSIS — M797 Fibromyalgia: Secondary | ICD-10-CM

## 2022-06-18 DIAGNOSIS — M19042 Primary osteoarthritis, left hand: Secondary | ICD-10-CM

## 2022-06-18 DIAGNOSIS — Z8659 Personal history of other mental and behavioral disorders: Secondary | ICD-10-CM

## 2022-06-18 DIAGNOSIS — E785 Hyperlipidemia, unspecified: Secondary | ICD-10-CM

## 2022-06-18 DIAGNOSIS — L659 Nonscarring hair loss, unspecified: Secondary | ICD-10-CM

## 2022-06-18 DIAGNOSIS — G8929 Other chronic pain: Secondary | ICD-10-CM

## 2022-06-18 DIAGNOSIS — G4733 Obstructive sleep apnea (adult) (pediatric): Secondary | ICD-10-CM

## 2022-06-18 DIAGNOSIS — Z8269 Family history of other diseases of the musculoskeletal system and connective tissue: Secondary | ICD-10-CM

## 2022-06-20 LAB — PROTEIN ELECTROPHORESIS, SERUM, WITH REFLEX
Albumin ELP: 4.1 g/dL (ref 3.8–4.8)
Alpha 1: 0.3 g/dL (ref 0.2–0.3)
Alpha 2: 0.7 g/dL (ref 0.5–0.9)
Beta 2: 0.5 g/dL (ref 0.2–0.5)
Beta Globulin: 0.4 g/dL (ref 0.4–0.6)
Gamma Globulin: 1.5 g/dL (ref 0.8–1.7)
Total Protein: 7.5 g/dL (ref 6.1–8.1)

## 2022-06-20 LAB — ANTI-NUCLEAR AB-TITER (ANA TITER): ANA Titer 1: 1:1280 {titer} — ABNORMAL HIGH

## 2022-06-20 LAB — CBC WITH DIFFERENTIAL/PLATELET
Absolute Monocytes: 313 cells/uL (ref 200–950)
Basophils Absolute: 61 cells/uL (ref 0–200)
Basophils Relative: 1.7 %
Eosinophils Absolute: 202 cells/uL (ref 15–500)
Eosinophils Relative: 5.6 %
HCT: 39.3 % (ref 35.0–45.0)
Hemoglobin: 13 g/dL (ref 11.7–15.5)
Lymphs Abs: 929 cells/uL (ref 850–3900)
MCH: 27.6 pg (ref 27.0–33.0)
MCHC: 33.1 g/dL (ref 32.0–36.0)
MCV: 83.4 fL (ref 80.0–100.0)
MPV: 10.3 fL (ref 7.5–12.5)
Monocytes Relative: 8.7 %
Neutro Abs: 2095 cells/uL (ref 1500–7800)
Neutrophils Relative %: 58.2 %
Platelets: 216 10*3/uL (ref 140–400)
RBC: 4.71 10*6/uL (ref 3.80–5.10)
RDW: 13.1 % (ref 11.0–15.0)
Total Lymphocyte: 25.8 %
WBC: 3.6 10*3/uL — ABNORMAL LOW (ref 3.8–10.8)

## 2022-06-20 LAB — URINALYSIS, ROUTINE W REFLEX MICROSCOPIC
Bilirubin Urine: NEGATIVE
Glucose, UA: NEGATIVE
Hgb urine dipstick: NEGATIVE
Leukocytes,Ua: NEGATIVE
Nitrite: NEGATIVE
Protein, ur: NEGATIVE
Specific Gravity, Urine: 1.026 (ref 1.001–1.035)
pH: 6 (ref 5.0–8.0)

## 2022-06-20 LAB — COMPLETE METABOLIC PANEL WITH GFR
AG Ratio: 1.3 (calc) (ref 1.0–2.5)
ALT: 7 U/L (ref 6–29)
AST: 23 U/L (ref 10–35)
Albumin: 4.2 g/dL (ref 3.6–5.1)
Alkaline phosphatase (APISO): 60 U/L (ref 37–153)
BUN: 18 mg/dL (ref 7–25)
CO2: 24 mmol/L (ref 20–32)
Calcium: 9.9 mg/dL (ref 8.6–10.4)
Chloride: 107 mmol/L (ref 98–110)
Creat: 0.93 mg/dL (ref 0.50–1.05)
Globulin: 3.3 g/dL (calc) (ref 1.9–3.7)
Glucose, Bld: 97 mg/dL (ref 65–99)
Potassium: 4 mmol/L (ref 3.5–5.3)
Sodium: 140 mmol/L (ref 135–146)
Total Bilirubin: 0.4 mg/dL (ref 0.2–1.2)
Total Protein: 7.5 g/dL (ref 6.1–8.1)
eGFR: 67 mL/min/{1.73_m2} (ref >=60)

## 2022-06-20 LAB — SJOGRENS SYNDROME-A EXTRACTABLE NUCLEAR ANTIBODY: SSA (Ro) (ENA) Antibody, IgG: >8 AI — AB

## 2022-06-20 LAB — SJOGRENS SYNDROME-B EXTRACTABLE NUCLEAR ANTIBODY: SSB (La) (ENA) Antibody, IgG: >8 AI — AB

## 2022-06-20 LAB — RHEUMATOID FACTOR: Rheumatoid fact SerPl-aCnc: 15 IU/mL — ABNORMAL HIGH (ref <14)

## 2022-06-20 LAB — SEDIMENTATION RATE: Sed Rate: 29 mm/h (ref 0–30)

## 2022-06-20 LAB — C3 AND C4
C3 Complement: 125 mg/dL (ref 83–193)
C4 Complement: 33 mg/dL (ref 15–57)

## 2022-06-20 LAB — ANA: Anti Nuclear Antibody (ANA): POSITIVE — AB

## 2022-06-20 NOTE — Progress Notes (Signed)
RF is borderline elevated but continues to trend down.  Ro and La antibodies remain positive. ESR and complements WNL.    WBC count is borderline low but stable.  Rest of CBC WNL.   CMP WNL.  UA trace ketones.  Negative for protein.

## 2022-06-20 NOTE — Progress Notes (Signed)
SPEP did not reveal any abnormal protein bands.

## 2022-06-21 NOTE — Progress Notes (Signed)
ANA remains positive.

## 2022-06-21 NOTE — Progress Notes (Signed)
No changes recommended at this time

## 2022-07-03 HISTORY — PX: CATARACT EXTRACTION, BILATERAL: SHX1313

## 2022-07-29 LAB — LAB REPORT - SCANNED: EGFR: 72

## 2022-08-06 ENCOUNTER — Telehealth: Payer: Self-pay | Admitting: Rheumatology

## 2022-08-06 NOTE — Telephone Encounter (Signed)
Patient called stating she has a rash on her face that began approximately 2 weeks ago.  Patient states the rash is on her cheeks and across her nose.  She is experiencing itching and burning sensation and it is warm to touch.  The rash is red and smooth.  Patient states the rash is more prominent after she gets out of the shower.

## 2022-08-06 NOTE — Telephone Encounter (Addendum)
Patient states she has not tried any topical creams, has not changed any skin care products and has not had any direct sun exposure. Patient states she has never had this rash before, onset was approximately 2 weeks ago. Patient denies any sores or blisters on her face and states the rash is actually more redness than an actual rash. Patient states it is more prominent at times but has not completely resolved in the last two weeks. The redness/rash is only on her face. Patient was last seen on 06/18/2022 and is scheduled for a follow up on 09/06/2022.

## 2022-08-06 NOTE — Telephone Encounter (Signed)
Advised patient that per Dr. Corliss Skains, she should see dermatologist or PCP for quick evaluation. Her autoimmune labs were negative in October. Patient verbalized understanding.

## 2022-08-06 NOTE — Telephone Encounter (Signed)
Please advise patient to see a dermatologist or her PCP for a quick evaluation.  Her autoimmune labs were negative in October.

## 2022-08-23 NOTE — Progress Notes (Signed)
Office Visit Note  Patient: Kaitlyn Hudson             Date of Birth: [birth year]           MRN: [id number]             PCP: Nonnie Done., MD Referring: Nonnie Done., MD Visit Date: 09/06/2022 Occupation: @  Subjective:  Facial rash   History of Present Illness: Kaitlyn Hudson is a 71 y.o. female with history of sjogren's syndrome, osteoarthritis, and fibromyalgia.  Patient is not currently taking any immunosuppressive agents.  Patient reports that in early November 2023 she started to notice a facial rash intermittently.  She has been unable to see her dermatologist but has an upcoming appointment in February 2024 at [facility] dermatology.  She has been unable to identify a trigger for the facial rash.  She has been avoiding direct sun exposure.  She has not changed any facial products prior to or after the rash.  She brought several pictures of the rash today which showed redness on her cheeks sparing the nasolabial folds.  She has also had a rash on her arms intermittently.  Patient reports over the past 6 to 8 weeks she is also been having increased fatigue, hair loss, ongoing sicca symptoms, and increased arthralgias.  She has had increased discomfort in both hands and both feet.  She states that her hands have been swelling intermittently.  She states that her left knee joint pain improved after having a cortisone injection performed on 05/23/2022.  She has been experiencing increased pain in the right groin.  She plans on scheduling a follow-up visit with Dr. Shon Baton to have an intra-articular right hip injection.  She has been taking Advil for pain relief which is providing temporary relief. Of note the patients mother previously had lupus.    Activities of Daily Living:  Patient reports morning stiffness for all day. Patient Reports nocturnal pain.  Difficulty dressing/grooming: Denies Difficulty climbing stairs: Denies Difficulty getting out of chair: Reports Difficulty  using hands for taps, buttons, cutlery, and/or writing: Reports  Review of Systems  Constitutional:  Positive for fatigue.  HENT:  Positive for mouth dryness. Negative for mouth sores.   Eyes:  Positive for dryness.  Respiratory:  Positive for shortness of breath.   Cardiovascular:  Negative for chest pain and palpitations.  Gastrointestinal:  Negative for blood in stool, constipation and diarrhea.  Endocrine: Negative for increased urination.  Genitourinary:  Negative for involuntary urination.  Musculoskeletal:  Positive for joint pain, joint pain, myalgias, muscle weakness, morning stiffness, muscle tenderness and myalgias. Negative for gait problem and joint swelling.  Skin:  Positive for rash, hair loss, redness and sensitivity to sunlight. Negative for color change.  Allergic/Immunologic: Negative for susceptible to infections.  Neurological:  Negative for dizziness and headaches.  Hematological:  Negative for swollen glands.  Psychiatric/Behavioral:  Positive for sleep disturbance. Negative for depressed mood. The patient is not nervous/anxious.     PMFS History:  Patient Active Problem List   Diagnosis Date Noted   Unilateral primary osteoarthritis, left knee 05/23/2022   Acute lateral meniscus tear of left knee 05/07/2022   Pain in right hip 03/28/2022   Trochanteric bursitis, right hip 03/28/2022   CAD in native artery 06/26/2018   Sleep apnea 05/15/2018   Angina pectoris (HCC) 05/15/2018   SOB (shortness of breath) on exertion 05/11/2018   Chest pressure 05/11/2018   Fibromyalgia 05/11/2018   Sjogren's syndrome with  keratoconjunctivitis sicca (HCC) 04/08/2018   Former smoker 04/08/2018   Family history of systemic lupus erythematosus (SLE) in mother 04/08/2018   Dyslipidemia 04/08/2018   Primary osteoarthritis of both hands 04/08/2018   History of depression 03/16/2018   History of hyperlipidemia 03/16/2018   Anxiety 04/02/2016   History of cervical dysplasia  04/02/2016   Hyperlipidemia 04/02/2016    Past Medical History:  Diagnosis Date   Allergic rhinitis    Anxiety 04/02/2016   Chest pressure 05/11/2018   Depression    Dyslipidemia 04/08/2018   Exertional shortness of breath 05/11/2018   Family history of systemic lupus erythematosus (SLE) in mother 04/08/2018   Fibromyalgia    Former smoker 04/08/2018   History of cervical dysplasia 04/02/2016   2013; hysterectomy d/t recurrent dysplasia s/p LEEP   History of depression 03/16/2018   History of hyperlipidemia 03/16/2018   Hypercholesteremia 04/02/2016   Osteoarthritis    Primary osteoarthritis of both hands 04/08/2018   Sjogren's syndrome with keratoconjunctivitis sicca (HCC) 04/08/2018   +ANA, +Ro, +La, sicca symptoms, fatigue   Trochanteric bursitis, right hip 01/09/2018    Family History  Problem Relation Age of Onset   Lupus Mother    Osteoarthritis Mother    Polymyalgia rheumatica Mother    Osteoarthritis Father    Alzheimer's disease Father    Breast cancer Maternal Grandmother    Past Surgical History:  Procedure Laterality Date   CATARACT EXTRACTION, BILATERAL  07/2022   HYSTEROTOMY     KNEE SURGERY Right    LYMPH NODE BIOPSY     groin    NASAL SEPTUM SURGERY  12/2020   Social History   Social History Narrative   Not on file   Immunization History  Administered Date(s) Administered   Influenza, High Dose Seasonal PF 06/24/2018   Moderna Sars-Covid-2 Vaccination 09/23/2019, 10/20/2019, 06/30/2020     Objective: Vital Signs: BP 110/69 (BP Location: Right Arm, Patient Position: Sitting, Cuff Size: Large)   Pulse 97   Resp 14   Ht 5' 3.5" (1.613 m)   Wt 186 lb (84.4 kg)   BMI 32.43 kg/m    Physical Exam Vitals and nursing note reviewed.  Constitutional:      Appearance: She is well-developed.  HENT:     Head: Normocephalic and atraumatic.  Eyes:     Conjunctiva/sclera: Conjunctivae normal.  Cardiovascular:     Rate and Rhythm: Normal rate and regular rhythm.      Heart sounds: Normal heart sounds.  Pulmonary:     Effort: Pulmonary effort is normal.     Breath sounds: Normal breath sounds.  Abdominal:     General: Bowel sounds are normal.     Palpations: Abdomen is soft.  Musculoskeletal:     Cervical back: Normal range of motion.  Skin:    General: Skin is warm and dry.     Capillary Refill: Capillary refill takes less than 2 seconds.  Neurological:     Mental Status: She is alert and oriented to person, place, and time.  Psychiatric:        Behavior: Behavior normal.      Musculoskeletal Exam: C-spine, thoracic spine, lumbar spine good range of motion.  Shoulder joints, elbow joints, wrist joints, MCPs, PIPs, DIPs have good range of motion with no synovitis.  Tenderness over the left second MCP and left fourth PIP joint.  PIP and DIP thickening noted.  Painful range of motion with limitation in the right hip.  Left hip has good range  of motion with no discomfort.  Knee joints have good range of motion with no warmth or effusion.  Ankle joints have good range of motion with no tenderness or joint swelling.  Tenderness over MTP joints.  CDAI Exam: CDAI Score: -- Patient Global: --; Provider Global: -- Swollen: 0 ; Tender: 2  Joint Exam 09/06/2022      Right  Left  MCP 2      Tender  PIP 4      Tender     Investigation: No additional findings.  Imaging: No results found.  Recent Labs: Lab Results  Component Value Date   WBC 3.6 (L) 06/18/2022   HGB 13.0 06/18/2022   PLT 216 06/18/2022   NA 140 06/18/2022   K 4.0 06/18/2022   CL 107 06/18/2022   CO2 24 06/18/2022   GLUCOSE 97 06/18/2022   BUN 18 06/18/2022   CREATININE 0.93 06/18/2022   BILITOT 0.4 06/18/2022   AST 23 06/18/2022   ALT 7 06/18/2022   PROT 7.5 06/18/2022   PROT 7.5 06/18/2022   CALCIUM 9.9 06/18/2022   GFRAA 80 10/24/2020    Speciality Comments: No specialty comments available.  Procedures:  No procedures performed Allergies: Codeine   Assessment  / Plan:     Visit Diagnoses: Sjogren's syndrome with keratoconjunctivitis sicca (HCC) - +ANA, +Ro, +La, +RF, sicca symptoms, fatigue: Patient was last seen in the office on 06/18/2022 at which time she had updated lab work: ANA 1: 1280 nuclear fine speckled, Ro>8, La>8, RF 15, SPEP did not reveal any abnormal proteins, complements within normal limits, ESR within normal limits, CMP WNL, and WBC count low-3.6.  She is not currently taking any immunosuppressive agents.  Since the beginning of November 2023 she has been experiencing increased fatigue, increased hair loss, intermittent facial rashes, ongoing sicca symptoms, and increased joint pain and intermittent swelling in both hands and both feet.  The patient brought several photographs of the facial rash which revealed facial erythema on both cheeks sparing the nasolabial folds.  She has also had a rash on her arms intermittently.  She has been avoiding direct sun exposure and has been unable to identify a trigger for her symptoms.  She has some tenderness over the left second MCP and fourth PIP joint but no obvious synovitis was noted on examination today.  Of note the patient's mother was previously diagnosed with lupus and was treated with both Plaquenil and methotrexate in the past.  The following lab work will be obtained today for further evaluation.  Discussed initiating a trial of Plaquenil to try to alleviate her current symptoms.  Indications, contraindications, and potential side effects of Plaquenil were discussed today in detail.  All questions were addressed and consent was obtained.  CBC, CMP, G6PD will be obtained today prior to sending in the prescription for Plaquenil.  She will take Plaquenil 200 mg 1 tablet by mouth twice daily Monday through Friday.  She will notify us if she cannot tolerate taking Plaquenil.  She will return for lab work in 1 month, 3 months, then every 5 months.  She will follow-up in the office in 6 to 8 weeks to assess  her response.  - Plan: Protein / creatinine ratio, urine, CBC with Differential/Platelet, COMPLETE METABOLIC PANEL WITH GFR, Anti-DNA antibody, double-stranded, C3 and C4, Sedimentation rate, Glucose 6 phosphate dehydrogenase, RNP Antibody, Anti-Smith antibody, Beta-2 glycoprotein antibodies, Cardiolipin antibodies, IgG, IgM, IgA  Patient was counseled on the purpose, proper use, and adverse effects  of hydroxychloroquine including nausea/diarrhea, skin rash, headaches, and sun sensitivity.  Advised patient to wear sunscreen once starting hydroxychloroquine to reduce risk of rash associated with sun sensitivity.  Discussed importance of annual eye exams while on hydroxychloroquine to monitor to ocular toxicity and discussed importance of frequent laboratory monitoring.  Provided patient with eye exam form for baseline ophthalmologic exam.  Reviewed risk for QTC prolongation when used in combination with other QTc prolonging agents (including but not limited to antiarrhythmics, macrolide antibiotics, flouroquinolones, tricyclic antidepressants, citalopram, specific antipsychotics, ondansetron, migraine triptans, and methadone). Provided patient with educational materials on hydroxychloroquine and answered all questions.  Patient consented to hydroxychloroquine. Will upload consent in the media tab.    Dose will be Plaquenil 200 mg twice daily Monday through Friday.  Prescription pending lab results.  High risk medication use - Plan to initiate Plaquenil 200 mg 1 tablet by mouth twice daily Monday through Friday.  Prescription for Plaquenil be sent pending lab results including CBC, CMP, G6PD.  She will then return for lab work in 1 month, 3 months, then every 5 months.  She was advised to call her ophthalmologist to schedule a baseline Plaquenil eye examination.  She was given a Plaquenil eye examination form to take with her to her upcoming appointment.   She will notify us if she cannot tolerate taking  Plaquenil.  Plan: CBC with Differential/Platelet, COMPLETE METABOLIC PANEL WITH GFR, Glucose 6 phosphate dehydrogenase  Positive ANA (antinuclear antibody) -ANA 1: 1280 nuclear, fine speckled, Ro>8, La>8, RF 15:  The following lab work will be updated today for further evaluation.  Family history of systemic lupus.  Patient will be initiating a trial of Plaquenil.  plan: Protein / creatinine ratio, urine, CBC with Differential/Platelet, COMPLETE METABOLIC PANEL WITH GFR, Anti-DNA antibody, double-stranded, C3 and C4, Sedimentation rate, Glucose 6 phosphate dehydrogenase, RNP Antibody, Anti-Smith antibody, Beta-2 glycoprotein antibodies, Cardiolipin antibodies, IgG, IgM, IgA  Primary osteoarthritis of both hands: She has PIP and DIP thickening consistent with osteoarthritis of both hands.  She has been experiencing increased pain in both hands as well as intermittent joint swelling.  On examination today she has tenderness over the left second MCP joint and left fourth PIP joint.  No obvious synovitis was noted at this time.  Pain in right hip - X-rays were unremarkable.  Previously under the care of Dr. Cleophas Dunker.  She had a right trochanteric bursa cortisone injection on 03/28/2022.  She has been experiencing increased groin pain in the right hip.  On examination she has painful limited range of motion.  She plans on following up with Dr. Shon Baton to discuss an intra-articular left hip injection.  Chronic pain of left knee - X-rays of the left knee were obtained on 05/07/2022.  MRI of the left knee was performed on 05/11/2022: Mild tricompartmental degenerative change.  Good range of motion of the left knee joint.  No warmth or effusion noted. The left knee joint was injected with cortisone on 05/23/2022 which righted significant relief.  Other fatigue: She has been experiencing increased fatigue over the past 8 weeks.  She has had difficulty sleeping at night which also has been contributing to her level of  fatigue.  The following lab work will be obtained today for further evaluation.  She will be also initiating a trial of Plaquenil as discussed above.  Fibromyalgia: She continues to experience intermittent myalgias and muscle tenderness due to fibromyalgia. She has been taking advil PRN for pain relief.   Osteopenia of  multiple sites - DEXA 08/30/2019 which showed T score -1.6.  DEXA ordered by PCP in 2020.  Overdue to update DEXA.  Hair loss: She has noticed increased hair thinning over the past 6 to 8 weeks.  She has an upcoming appoint with dermatology in February 2024.  Family history of systemic lupus erythematosus (SLE) in mother -Patient presents today experiencing increased fatigue, hair loss, sicca symptoms, arthralgias, and intermittent facial rashes.  Given her family history of systemic lupus as well as underlying history of Sjogren syndrome, the following lab work will be obtained today for further evaluation.  She will also try initiating a trial of Plaquenil.  Plan: Protein / creatinine ratio, urine, CBC with Differential/Platelet, COMPLETE METABOLIC PANEL WITH GFR, Anti-DNA antibody, double-stranded, C3 and C4, Sedimentation rate, Glucose 6 phosphate dehydrogenase, RNP Antibody, Anti-Smith antibody, Beta-2 glycoprotein antibodies, Cardiolipin antibodies, IgG, IgM, IgA  Other medical conditions are listed as follows:  Dyslipidemia  History of depression  Obstructive sleep apnea syndrome  Former smoker    Orders: Orders Placed This Encounter  Procedures   Protein / creatinine ratio, urine   CBC with Differential/Platelet   COMPLETE METABOLIC PANEL WITH GFR   Anti-DNA antibody, double-stranded   C3 and C4   Sedimentation rate   Glucose 6 phosphate dehydrogenase   RNP Antibody   Anti-Smith antibody   Beta-2 glycoprotein antibodies   Cardiolipin antibodies, IgG, IgM, IgA   No orders of the defined types were placed in this encounter.   Follow-Up Instructions: Return  in about 6 weeks (around 10/18/2022).   Gearldine Bienenstock, PA-C  Note - This record has been created using Dragon software.  Chart creation errors have been sought, but may not always  have been located. Such creation errors do not reflect on  the standard of medical care.

## 2022-09-06 ENCOUNTER — Telehealth: Payer: Self-pay

## 2022-09-06 ENCOUNTER — Encounter: Payer: Self-pay | Admitting: Physician Assistant

## 2022-09-06 ENCOUNTER — Ambulatory Visit: Payer: PPO | Attending: Physician Assistant | Admitting: Physician Assistant

## 2022-09-06 VITALS — BP 110/69 | HR 97 | Resp 14 | Ht 63.5 in | Wt 186.0 lb

## 2022-09-06 DIAGNOSIS — R768 Other specified abnormal immunological findings in serum: Secondary | ICD-10-CM

## 2022-09-06 DIAGNOSIS — M19041 Primary osteoarthritis, right hand: Secondary | ICD-10-CM

## 2022-09-06 DIAGNOSIS — Z79899 Other long term (current) drug therapy: Secondary | ICD-10-CM

## 2022-09-06 DIAGNOSIS — M797 Fibromyalgia: Secondary | ICD-10-CM

## 2022-09-06 DIAGNOSIS — M3501 Sicca syndrome with keratoconjunctivitis: Secondary | ICD-10-CM

## 2022-09-06 DIAGNOSIS — Z87891 Personal history of nicotine dependence: Secondary | ICD-10-CM

## 2022-09-06 DIAGNOSIS — M25551 Pain in right hip: Secondary | ICD-10-CM | POA: Diagnosis not present

## 2022-09-06 DIAGNOSIS — R5383 Other fatigue: Secondary | ICD-10-CM

## 2022-09-06 DIAGNOSIS — M8589 Other specified disorders of bone density and structure, multiple sites: Secondary | ICD-10-CM

## 2022-09-06 DIAGNOSIS — M25562 Pain in left knee: Secondary | ICD-10-CM | POA: Diagnosis not present

## 2022-09-06 DIAGNOSIS — M19042 Primary osteoarthritis, left hand: Secondary | ICD-10-CM

## 2022-09-06 DIAGNOSIS — L659 Nonscarring hair loss, unspecified: Secondary | ICD-10-CM

## 2022-09-06 DIAGNOSIS — Z8269 Family history of other diseases of the musculoskeletal system and connective tissue: Secondary | ICD-10-CM

## 2022-09-06 DIAGNOSIS — Z8659 Personal history of other mental and behavioral disorders: Secondary | ICD-10-CM

## 2022-09-06 DIAGNOSIS — E785 Hyperlipidemia, unspecified: Secondary | ICD-10-CM

## 2022-09-06 DIAGNOSIS — G4733 Obstructive sleep apnea (adult) (pediatric): Secondary | ICD-10-CM

## 2022-09-06 DIAGNOSIS — G8929 Other chronic pain: Secondary | ICD-10-CM

## 2022-09-06 NOTE — Patient Instructions (Signed)
Standing Labs We placed an order today for your standing lab work.   Please have your standing labs drawn in 1 month, 3 months, and every 5 months  Please have your labs drawn 2 weeks prior to your appointment so that the provider can discuss your lab results at your appointment.  Please note that you may see your imaging and lab results in [place] before we have reviewed them. We will contact you once all results are reviewed. Please allow our office up to 72 hours to thoroughly review all of the results before contacting the office for clarification of your results.  Lab hours are:   Monday through Thursday from 8:00 am -12:30 pm and 1:00 pm-5:00 pm and Friday from 8:00 am-12:00 pm.  Please be advised, all patients with office appointments requiring lab work will take precedent over walk-in lab work.   Labs are drawn by [facility]. Please bring your co-pay at the time of your lab draw.  You may receive a bill from [facility] for your lab work.  Please note if you are on Hydroxychloroquine and and an order has been placed for a Hydroxychloroquine level, you will need to have it drawn 4 hours or more after your last dose.  If you wish to have your labs drawn at another location, please call the office 24 hours in advance so we can fax the orders.  The office is located at [address] [place], [place], [place] [address] No appointment is necessary.    If you have any questions regarding directions or hours of operation,  please call [phone].   As a reminder, please drink plenty of water prior to coming for your lab work. Thanks!   Hydroxychloroquine Tablets What is this medication? HYDROXYCHLOROQUINE (hye drox ee KLOR oh kwin) treats autoimmune conditions, such as rheumatoid arthritis and lupus. It works by slowing down an overactive immune system. It may also be used to prevent and treat malaria. It works by killing the parasite that causes malaria. It belongs to a group of  medications called DMARDs. This medicine may be used for other purposes; ask your health care provider or pharmacist if you have questions. COMMON BRAND NAME(S): Plaquenil, Quineprox What should I tell my care team before I take this medication? They need to know if you have any of these conditions: Diabetes Eye disease, vision problems Frequently drink alcohol G6PD deficiency Heart disease Irregular heartbeat or rhythm Kidney disease Liver disease Porphyria Psoriasis An unusual or allergic reaction to hydroxychloroquine, other medications, foods, dyes, or preservatives Pregnant or trying to get pregnant Breastfeeding How should I use this medication? Take this medication by mouth with water. Take it as directed on the prescription label. Do not cut, crush, or chew this medication. Swallow the tablets whole. Take it with food. Do not take it more than directed. Take all of this medication unless your care team tells you to stop it early. Keep taking it even if you think you are better. Take products with antacids in them at a different time of day than this medication. Take this medication 4 hours before or 4 hours after antacids. Talk to your care team if you have questions. Talk to your care team about the use of this medication in children. While this medication may be prescribed for selected conditions, precautions do apply. Overdosage: If you think you have taken too much of this medicine contact a poison control center or emergency room at once. NOTE: This medicine is only for  you. Do not share this medicine with others. What if I miss a dose? If you miss a dose, take it as soon as you can. If it is almost time for your next dose, take only that dose. Do not take double or extra doses. What may interact with this medication? Do not take this medication with any of the following: Cisapride Dronedarone Pimozide Thioridazine This medication may also interact with the  following: Ampicillin Antacids Cimetidine Cyclosporine Digoxin Kaolin Medications for diabetes, such as insulin, glipizide, glyburide Medications for seizures, such as carbamazepine, phenobarbital, phenytoin Mefloquine Methotrexate Other medications that cause heart rhythm changes Praziquantel This list may not describe all possible interactions. Give your health care provider a list of all the medicines, herbs, non-prescription drugs, or dietary supplements you use. Also tell them if you smoke, drink alcohol, or use illegal drugs. Some items may interact with your medicine. What should I watch for while using this medication? Visit your care team for regular checks on your progress. Tell your care team if your symptoms do not start to get better or if they get worse. You may need blood work done while you are taking this medication. If you take other medications that can affect heart rhythm, you may need more testing. Talk to your care team if you have questions. Your vision may be tested before and during use of this medication. Tell your care team right away if you have any change in your eyesight. This medication may cause serious skin reactions. They can happen weeks to months after starting the medication. Contact your care team right away if you notice fevers or flu-like symptoms with a rash. The rash may be red or purple and then turn into blisters or peeling of the skin. Or, you might notice a red rash with swelling of the face, lips or lymph nodes in your neck or under your arms. If you or your family notice any changes in your behavior, such as new or worsening depression, thoughts of harming yourself, anxiety, or other unusual or disturbing thoughts, or memory loss, call your care team right away. What side effects may I notice from receiving this medication? Side effects that you should report to your care team as soon as possible: Allergic reactions--skin rash, itching, hives,  swelling of the face, lips, tongue, or throat Aplastic anemia--unusual weakness or fatigue, dizziness, headache, trouble breathing, increased bleeding or bruising Change in vision Heart rhythm changes--fast or irregular heartbeat, dizziness, feeling faint or lightheaded, chest pain, trouble breathing Infection--fever, chills, cough, or sore throat Low blood sugar (hypoglycemia)--tremors or shaking, anxiety, sweating, cold or clammy skin, confusion, dizziness, rapid heartbeat Muscle injury--unusual weakness or fatigue, muscle pain, dark yellow or brown urine, decrease in amount of urine Pain, tingling, or numbness in the hands or feet Rash, fever, and swollen lymph nodes Redness, blistering, peeling, or loosening of the skin, including inside the mouth Thoughts of suicide or self-harm, worsening mood, or feelings of depression Unusual bruising or bleeding Side effects that usually do not require medical attention (report to your care team if they continue or are bothersome): Diarrhea Headache Nausea Stomach pain Vomiting This list may not describe all possible side effects. Call your doctor for medical advice about side effects. You may report side effects to FDA at 1-800-FDA-1088. Where should I keep my medication? Keep out of the reach of children and pets. Store at room temperature up to 30 degrees C (86 degrees F). Protect from light. Get rid of any unused  medication after the expiration date. To get rid of medications that are no longer needed or have expired: Take the medication to a medication take-back program. Check with your pharmacy or law enforcement to find a location. If you cannot return the medication, check the label or package insert to see if the medication should be thrown out in the garbage or flushed down the toilet. If you are not sure, ask your care team. If it is safe to put it in the trash, empty the medication out of the container. Mix the medication with cat litter,  dirt, coffee grounds, or other unwanted substance. Seal the mixture in a bag or container. Put it in the trash. NOTE: This sheet is a summary. It may not cover all possible information. If you have questions about this medicine, talk to your doctor, pharmacist, or health care provider.  2023 Elsevier/Gold Standard (2004-10-30 00:00:00)

## 2022-09-06 NOTE — Progress Notes (Signed)
ESR is elevated.  WBC count is low-2.9. absolute lymphocyte count is low.  Rest of CBC WNL.

## 2022-09-06 NOTE — Telephone Encounter (Signed)
Pending lab results, patient will be starting plaquenil per Taylor Dale, PA-C. Thanks!   Consent obtained and sent to the scan center.  

## 2022-09-09 ENCOUNTER — Other Ambulatory Visit: Payer: Self-pay | Admitting: *Deleted

## 2022-09-09 MED ORDER — HYDROXYCHLOROQUINE SULFATE 200 MG PO TABS: ORAL_TABLET | ORAL | 0 refills | Status: DC

## 2022-09-09 NOTE — Progress Notes (Signed)
CMP WNL. dsDNA is negative.  RNP negative.  Complements WNL.  Smith negative.   Protein creatinine ratio WNL.    G6PDH WNL. Ok to initiate plaquenil.

## 2022-09-12 LAB — COMPLETE METABOLIC PANEL WITH GFR
AG Ratio: 1.2 (calc) (ref 1.0–2.5)
ALT: 8 U/L (ref 6–29)
AST: 28 U/L (ref 10–35)
Albumin: 4.2 g/dL (ref 3.6–5.1)
Alkaline phosphatase (APISO): 61 U/L (ref 37–153)
BUN: 21 mg/dL (ref 7–25)
CO2: 24 mmol/L (ref 20–32)
Calcium: 9.7 mg/dL (ref 8.6–10.4)
Chloride: 105 mmol/L (ref 98–110)
Creat: 0.75 mg/dL (ref 0.60–1.00)
Globulin: 3.4 g/dL (calc) (ref 1.9–3.7)
Glucose, Bld: 96 mg/dL (ref 65–99)
Potassium: 4.3 mmol/L (ref 3.5–5.3)
Sodium: 139 mmol/L (ref 135–146)
Total Bilirubin: 0.3 mg/dL (ref 0.2–1.2)
Total Protein: 7.6 g/dL (ref 6.1–8.1)
eGFR: 86 mL/min/{1.73_m2} (ref >=60)

## 2022-09-12 LAB — CBC WITH DIFFERENTIAL/PLATELET
Absolute Monocytes: 371 cells/uL (ref 200–950)
Basophils Absolute: 49 cells/uL (ref 0–200)
Basophils Relative: 1.7 %
Eosinophils Absolute: 122 cells/uL (ref 15–500)
Eosinophils Relative: 4.2 %
HCT: 38.8 % (ref 35.0–45.0)
Hemoglobin: 12.7 g/dL (ref 11.7–15.5)
Lymphs Abs: 803 cells/uL — ABNORMAL LOW (ref 850–3900)
MCH: 26.7 pg — ABNORMAL LOW (ref 27.0–33.0)
MCHC: 32.7 g/dL (ref 32.0–36.0)
MCV: 81.5 fL (ref 80.0–100.0)
MPV: 10 fL (ref 7.5–12.5)
Monocytes Relative: 12.8 %
Neutro Abs: 1554 cells/uL (ref 1500–7800)
Neutrophils Relative %: 53.6 %
Platelets: 194 10*3/uL (ref 140–400)
RBC: 4.76 10*6/uL (ref 3.80–5.10)
RDW: 13.1 % (ref 11.0–15.0)
Total Lymphocyte: 27.7 %
WBC: 2.9 10*3/uL — ABNORMAL LOW (ref 3.8–10.8)

## 2022-09-12 LAB — RNP ANTIBODY: Ribonucleic Protein(ENA) Antibody, IgG: <1 AI

## 2022-09-12 LAB — PROTEIN / CREATININE RATIO, URINE
Creatinine, Urine: 85 mg/dL (ref 20–275)
Protein/Creat Ratio: 106 mg/g creat (ref 24–184)
Protein/Creatinine Ratio: 0.106 mg/mg creat (ref 0.024–0.184)
Total Protein, Urine: 9 mg/dL (ref 5–24)

## 2022-09-12 LAB — BETA-2 GLYCOPROTEIN ANTIBODIES
Beta-2 Glyco 1 IgA: <2 U/mL (ref <20.0)
Beta-2 Glyco 1 IgM: 2.1 U/mL (ref <20.0)
Beta-2 Glyco I IgG: <2 U/mL (ref <20.0)

## 2022-09-12 LAB — ANTI-DNA ANTIBODY, DOUBLE-STRANDED: ds DNA Ab: 1 IU/mL

## 2022-09-12 LAB — SEDIMENTATION RATE: Sed Rate: 34 mm/h — ABNORMAL HIGH (ref 0–30)

## 2022-09-12 LAB — GLUCOSE 6 PHOSPHATE DEHYDROGENASE: G-6PDH: 15.1 U/g Hgb (ref 7.0–20.5)

## 2022-09-12 LAB — CARDIOLIPIN ANTIBODIES, IGG, IGM, IGA
Anticardiolipin IgA: <2 APL-U/mL (ref <20.0)
Anticardiolipin IgG: <2 GPL-U/mL (ref <20.0)
Anticardiolipin IgM: 2 MPL-U/mL (ref <20.0)

## 2022-09-12 LAB — ANTI-SMITH ANTIBODY: ENA SM Ab Ser-aCnc: <1 AI

## 2022-09-12 LAB — C3 AND C4
C3 Complement: 120 mg/dL (ref 83–193)
C4 Complement: 29 mg/dL (ref 15–57)

## 2022-09-13 NOTE — Telephone Encounter (Signed)
Prescription sent in on 09/09/2022.

## 2022-09-13 NOTE — Progress Notes (Signed)
Beta-2 glycoprotein antibodies and anticardiolipin antibodies are negative.

## 2022-09-20 ENCOUNTER — Ambulatory Visit: Payer: Self-pay

## 2022-09-20 ENCOUNTER — Encounter: Payer: Self-pay | Admitting: Sports Medicine

## 2022-09-20 ENCOUNTER — Ambulatory Visit (INDEPENDENT_AMBULATORY_CARE_PROVIDER_SITE_OTHER): Payer: PPO | Admitting: Sports Medicine

## 2022-09-20 DIAGNOSIS — M1611 Unilateral primary osteoarthritis, right hip: Secondary | ICD-10-CM | POA: Diagnosis not present

## 2022-09-20 DIAGNOSIS — M25551 Pain in right hip: Secondary | ICD-10-CM | POA: Diagnosis not present

## 2022-09-20 MED ORDER — LIDOCAINE HCL 1 % IJ SOLN
4.0000 mL | INTRAMUSCULAR | Status: AC | PRN
  Administered 2022-09-20: 4 mL

## 2022-09-20 MED ORDER — METHYLPREDNISOLONE ACETATE 40 MG/ML IJ SUSP
80.0000 mg | INTRAMUSCULAR | Status: AC | PRN
  Administered 2022-09-20: 80 mg via INTRA_ARTICULAR

## 2022-09-20 NOTE — Progress Notes (Signed)
Right hip pain;  Last injection lasted for about 4 months  Inquiring about another injection today   Patient was instructed in 10 minutes of therapeutic exercises for right hip pain to improve strength, ROM and function according to my instructions and plan of care by a Certified Athletic Trainer during the office visit. A customized handout was provided and demonstration of proper technique shown and discussed. Patient did perform exercises and demonstrate understanding through teachback.  All questions discussed and answered.

## 2022-09-20 NOTE — Progress Notes (Signed)
Kaitlyn Hudson - 72 y.o. female MRN [id number]  Date of birth: 08-Apr-1951  Office Visit Note: Visit Date: 09/20/2022 PCP: Enid Skeens., MD Referred by: Enid Skeens., MD  Subjective: Chief Complaint  Patient presents with   Right Hip - Pain   HPI: Kaitlyn Hudson "Kaitlyn Frohlich" is a pleasant 72 y.o. female who presents today for follow-up of right hip pain.  Previously saw Dr. Durward Fortes in [place] versus for this, they did send her to me for ultrasound-guided intra-articular hip injection on 04/19/2022. Kaitlyn Frohlich states that the injection significantly helped got rid of her pain for at least 4 months.  Recently she has been noticing some of that similar pain on the anterior hip and into the groin with certain activities and hip flexion.  She has not done any physical therapy or home rehab exercises.  Occasional Tylenol.  Recently started on Plaquenil for her Sjogren's disease.  Pertinent ROS were reviewed with the patient and found to be negative unless otherwise specified above in HPI.   Assessment & Plan: Visit Diagnoses:  1. Pain in right hip   2. Unilateral primary osteoarthritis, right hip    Plan: Discussed with and did review her MRI which shows mild osteoarthritis with some high-grade cartilage loss, although certainly not bone-on-bone changes.  We discussed treatment options such as home versus physical therapy, oral medications, injection therapy.  Through shared decision-making, she elected to proceed with repeat ultrasound-guided hip injection, tolerated well.  I did review home exercise hip stabilization for her, and my athletic trainer, Lilia Pro, did review these exercises and demonstrated them alongside her today, teachback method used.  She is to wait 48 hours before starting these, but then do this once daily.  May use ice or Tylenol for any postinjection pain.  She will follow-up with me as needed.  Follow-up: Return if symptoms worsen or fail to improve.   Meds & Orders: No orders of  the defined types were placed in this encounter.   Orders Placed This Encounter  Procedures   Large Joint Inj   US Guided Needle Placement - No Linked Charges     Procedures: Large Joint Inj: R hip joint on 09/20/2022 2:17 PM Indications: pain Details: 22 G 3.5 in needle, ultrasound-guided anterior approach Medications: 4 mL lidocaine 1 %; 80 mg methylPREDNISolone acetate 40 MG/ML Outcome: tolerated well, no immediate complications  Procedure: US-guided intra-articular hip injection, Right After discussion on risks/benefits/indications and informed verbal consent was obtained, a timeout was performed. Patient was lying supine on exam table. The hip was cleaned with betadine and alcohol swabs. Then utilizing ultrasound guidance, the patient's femoral head and neck junction was identified and subsequently injected with 4:2 lidocaine:depomedrol via an in-plane approach with ultrasound visualization of the injectate administered into the hip joint. Patient tolerated procedure well without immediate complications.  Procedure, treatment alternatives, risks and benefits explained, specific risks discussed. Consent was given by the patient. Immediately prior to procedure a time out was called to verify the correct patient, procedure, equipment, support staff and site/side marked as required. Patient was prepped and draped in the usual sterile fashion.          Clinical History: No specialty comments available.  She reports that she quit smoking about 12 years ago. Her smoking use included cigarettes. She has a 15.00 pack-year smoking history. She has never been exposed to tobacco smoke. She has never used smokeless tobacco. No results for input(s): "HGBA1C", "LABURIC" in the last 8760 hours.  Objective:    Physical Exam  Gen: Well-appearing, in no acute distress; non-toxic CV: Well-perfused. Warm.  Resp: Breathing unlabored on room air; no wheezing. Psych: Fluid speech in conversation;  appropriate affect; normal thought process Neuro: Sensation intact throughout. No gross coordination deficits.   Ortho Exam - Right hip: There is no specific bony TTP over the ASIS or greater trochanter.  No overlying redness or effusion.  There is pain with internal logroll, positive FADIR test.  With resisted hip positive Stinchfield test.  Significant mechanical blocks to internal/external rotation, negative FABER test. NVI.  Imaging:  MRI right hip from 04/10/22: Reviewed and interpreted by myself.  There is high-grade partial thickness cartilage loss throughout the right hip joint, somewhat incongruent.  There is mild to moderate right hip osteoarthritis.  Other soft tissue changes.   Past Medical/Family/Surgical/Social History: Medications & Allergies reviewed per EMR, new medications updated. Patient Active Problem List   Diagnosis Date Noted   Unilateral primary osteoarthritis, left knee 05/23/2022   Acute lateral meniscus tear of left knee 05/07/2022   Pain in right hip 03/28/2022   Trochanteric bursitis, right hip 03/28/2022   CAD in native artery 06/26/2018   Sleep apnea 05/15/2018   Angina pectoris ([place]) 05/15/2018   SOB (shortness of breath) on exertion 05/11/2018   Chest pressure 05/11/2018   Fibromyalgia 05/11/2018   Sjogren's syndrome with keratoconjunctivitis sicca ([place]) 04/08/2018   Former smoker 04/08/2018   Family history of systemic lupus erythematosus (SLE) in mother 04/08/2018   Dyslipidemia 04/08/2018   Primary osteoarthritis of both hands 04/08/2018   History of depression 03/16/2018   History of hyperlipidemia 03/16/2018   Anxiety 04/02/2016   History of cervical dysplasia 04/02/2016   Hyperlipidemia 04/02/2016   Past Medical History:  Diagnosis Date   Allergic rhinitis    Anxiety 04/02/2016   Chest pressure 05/11/2018   Depression    Dyslipidemia 04/08/2018   Exertional shortness of breath 05/11/2018   Family history of systemic lupus erythematosus (SLE)  in mother 04/08/2018   Fibromyalgia    Former smoker 04/08/2018   History of cervical dysplasia 04/02/2016   2013; hysterectomy d/t recurrent dysplasia s/p LEEP   History of depression 03/16/2018   History of hyperlipidemia 03/16/2018   Hypercholesteremia 04/02/2016   Osteoarthritis    Primary osteoarthritis of both hands 04/08/2018   Sjogren's syndrome with keratoconjunctivitis sicca ([place]) 04/08/2018   +ANA, +Ro, +La, sicca symptoms, fatigue   Trochanteric bursitis, right hip 01/09/2018   Family History  Problem Relation Age of Onset   Lupus Mother    Osteoarthritis Mother    Polymyalgia rheumatica Mother    Osteoarthritis Father    Alzheimer's disease Father    Breast cancer Maternal Grandmother    Past Surgical History:  Procedure Laterality Date   CATARACT EXTRACTION, BILATERAL  07/2022   HYSTEROTOMY     KNEE SURGERY Right    LYMPH NODE BIOPSY     groin    NASAL SEPTUM SURGERY  12/2020   Social History   Occupational History   Not on file  Tobacco Use   Smoking status: Former    Packs/day: 0.50    Years: 30.00    Total pack years: 15.00    Types: Cigarettes    Quit date: 09/02/2010    Years since quitting: 12.0    Passive exposure: Never   Smokeless tobacco: Never  Vaping Use   Vaping Use: Never used  Substance and Sexual Activity   Alcohol use: No  Drug use: Never   Sexual activity: Not on file

## 2022-10-08 NOTE — Progress Notes (Unsigned)
Office Visit Note  Patient: Kaitlyn Hudson             Date of Birth: 08-11-1951           MRN: [id number]             PCP: Enid Skeens., MD Referring: Enid Skeens., MD Visit Date: 10/21/2022 Occupation: @GUAROCC$ @  Subjective:  Medication monitoring   History of Present Illness: Kaitlyn Hudson is a 72 y.o. female with history of Sjogren syndrome, osteoarthritis, and fibromyalgia.  Patient is currently taking Plaquenil 200 mg 1 tablet by mouth twice daily Monday through Friday.  Plaquenil was added after her last office visit on 09/06/2022.  She has been tolerating Plaquenil without any side effects.  Patient reports that she is scheduled for a baseline Plaquenil eye examination in March 2024.  She states that she has noticed a 75% improvement in her symptoms since initiating Plaquenil.  She has had less joint pain, joint stiffness, joint swelling.  Her energy level has also improved.  She denies any change in hair loss.  She states that she has less frequent and less severe malar rashes and denies any other rashes or increased photosensitivity.   She denies any symptoms of Raynaud's phenomenon.  She has not had any shortness of breath or pleuritic chest pain.  She denies any swollen lymph nodes.  She has not noticed any improvement in her sicca symptoms.  She continues to use Restasis for dry eyes.  She has been seeing the dentist every 6 months as advised.   Activities of Daily Living:  Patient reports morning stiffness for 5-10 minutes.   Patient Denies nocturnal pain.  Difficulty dressing/grooming: Denies Difficulty climbing stairs: Denies Difficulty getting out of chair: Denies Difficulty using hands for taps, buttons, cutlery, and/or writing: Denies  Review of Systems  Constitutional:  Negative for fatigue.  HENT:  Positive for mouth dryness. Negative for mouth sores.   Eyes:  Positive for dryness.  Respiratory:  Negative for shortness of breath.   Cardiovascular:  Negative for  chest pain and palpitations.  Gastrointestinal:  Negative for blood in stool, constipation and diarrhea.  Endocrine: Negative for increased urination.  Genitourinary:  Negative for involuntary urination.  Musculoskeletal:  Positive for joint pain, joint pain and morning stiffness. Negative for gait problem, joint swelling, myalgias, muscle weakness, muscle tenderness and myalgias.  Skin:  Positive for hair loss. Negative for color change, rash and sensitivity to sunlight.  Allergic/Immunologic: Negative for susceptible to infections.  Neurological:  Negative for dizziness and headaches.  Hematological:  Negative for swollen glands.  Psychiatric/Behavioral:  Negative for depressed mood and sleep disturbance. The patient is not nervous/anxious.     PMFS History:  Patient Active Problem List   Diagnosis Date Noted   Unilateral primary osteoarthritis, left knee 05/23/2022   Acute lateral meniscus tear of left knee 05/07/2022   Pain in right hip 03/28/2022   Trochanteric bursitis, right hip 03/28/2022   CAD in native artery 06/26/2018   Sleep apnea 05/15/2018   Angina pectoris ([place]) 05/15/2018   SOB (shortness of breath) on exertion 05/11/2018   Chest pressure 05/11/2018   Fibromyalgia 05/11/2018   Sjogren's syndrome with keratoconjunctivitis sicca ([place]) 04/08/2018   Former smoker 04/08/2018   Family history of systemic lupus erythematosus (SLE) in mother 04/08/2018   Dyslipidemia 04/08/2018   Primary osteoarthritis of both hands 04/08/2018   History of depression 03/16/2018   History of hyperlipidemia 03/16/2018   Anxiety 04/02/2016  History of cervical dysplasia 04/02/2016   Hyperlipidemia 04/02/2016    Past Medical History:  Diagnosis Date   Allergic rhinitis    Anxiety 04/02/2016   Chest pressure 05/11/2018   Depression    Dyslipidemia 04/08/2018   Exertional shortness of breath 05/11/2018   Family history of systemic lupus erythematosus (SLE) in mother 04/08/2018   Fibromyalgia     Former smoker 04/08/2018   History of cervical dysplasia 04/02/2016   2013; hysterectomy d/t recurrent dysplasia s/p LEEP   History of depression 03/16/2018   History of hyperlipidemia 03/16/2018   Hypercholesteremia 04/02/2016   Osteoarthritis    Primary osteoarthritis of both hands 04/08/2018   Sjogren's syndrome with keratoconjunctivitis sicca ([place]) 04/08/2018   +ANA, +Ro, +La, sicca symptoms, fatigue   Trochanteric bursitis, right hip 01/09/2018    Family History  Problem Relation Age of Onset   Lupus Mother    Osteoarthritis Mother    Polymyalgia rheumatica Mother    Osteoarthritis Father    Alzheimer's disease Father    Breast cancer Maternal Grandmother    Past Surgical History:  Procedure Laterality Date   CATARACT EXTRACTION, BILATERAL  07/2022   HYSTEROTOMY     KNEE SURGERY Right    LYMPH NODE BIOPSY     groin    NASAL SEPTUM SURGERY  12/2020   Social History   Social History Narrative   Not on file   Immunization History  Administered Date(s) Administered   Influenza, High Dose Seasonal PF 06/24/2018   Moderna Sars-Covid-2 Vaccination 09/23/2019, 10/20/2019, 06/30/2020     Objective: Vital Signs: BP 116/77 (BP Location: Left Arm, Patient Position: Sitting, Cuff Size: Normal)   Pulse 81   Resp 14   Ht 5' 3.5" (1.613 m)   Wt 183 lb (83 kg)   BMI 31.91 kg/m    Physical Exam Vitals and nursing note reviewed.  Constitutional:      Appearance: She is well-developed.  HENT:     Head: Normocephalic and atraumatic.  Eyes:     Conjunctiva/sclera: Conjunctivae normal.  Cardiovascular:     Rate and Rhythm: Normal rate and regular rhythm.     Heart sounds: Normal heart sounds.  Pulmonary:     Effort: Pulmonary effort is normal.     Breath sounds: Normal breath sounds.  Abdominal:     General: Bowel sounds are normal.     Palpations: Abdomen is soft.  Musculoskeletal:     Cervical back: Normal range of motion.  Skin:    General: Skin is warm and dry.      Capillary Refill: Capillary refill takes less than 2 seconds.  Neurological:     Mental Status: She is alert and oriented to person, place, and time.  Psychiatric:        Behavior: Behavior normal.      Musculoskeletal Exam: C-spine, thoracic spine, and lumbar spine good ROM.  Shoulder joints, elbow joints, wrist joints, MCPs, PIPs, and DIPs good ROM with no synovitis.  PIP and DIP thickening consistent with osteoarthritis of both hands.  Left CMC joint thickening.  Complete fist formation bilaterally.  Hip joints have slightly limited ROM.  Knee joints have good ROM with no warmth or effusion.  Ankle joints have good ROM with no tenderness or joint swelling.   CDAI Exam: CDAI Score: -- Patient Global: --; Provider Global: -- Swollen: --; Tender: -- Joint Exam 10/21/2022   No joint exam has been documented for this visit   There is currently no information  documented on the homunculus. Go to the Rheumatology activity and complete the homunculus joint exam.  Investigation: No additional findings.  Imaging: No results found.  Recent Labs: Lab Results  Component Value Date   WBC 4.2 10/15/2022   HGB 13.0 10/15/2022   PLT 242 10/15/2022   NA 139 10/15/2022   K 4.5 10/15/2022   CL 104 10/15/2022   CO2 27 10/15/2022   GLUCOSE 93 10/15/2022   BUN 18 10/15/2022   CREATININE 0.81 10/15/2022   BILITOT 0.3 10/15/2022   AST 19 10/15/2022   ALT 4 (L) 10/15/2022   PROT 7.8 10/15/2022   CALCIUM 10.2 10/15/2022   GFRAA 80 10/24/2020    Speciality Comments: No specialty comments available.  Procedures:  No procedures performed Allergies: Codeine    Assessment / Plan:     Visit Diagnoses: Sjogren's syndrome with keratoconjunctivitis sicca ([facility]) - +ANA, +Ro, +La, +RF, sicca symptoms, fatigue:  Patient was initiated on plaquenil 200 mg 1 tablet by mouth twice daily Monday through Friday after her last office visit on 09/06/22.  She has been tolerating plaquenil without any side  effects and has not missed any doses recently.  She has noticed about a 75% improvement in her symptoms since initiating Plaquenil.  She has had less arthralgias, joint stiffness, and fatigue since initiating Plaquenil.  She has no synovitis on examination today.  She has had less frequent and less severe Malar rashes.  Sicca symptoms are unchanged.  Upcoming appointment with ophthalmology in March 2024.  Continues to see the dentist every 6 months.  No cervical lymphadenopathy or parotid swelling or tenderness noted today.  She will remain on Plaquenil as prescribed.  CBC and CMP were updated on 10/15/2022 and results were reviewed today in the office.  Plan on updating autoimmune lab work at her next follow-up visit in 3 months.  Orders were placed today.  She was advised to notify us if she develops any signs or symptoms of a flare in the meantime. - Plan: COMPLETE METABOLIC PANEL WITH GFR, CBC with Differential/Platelet, Protein / creatinine ratio, urine, C3 and C4, Sedimentation rate, ANA, Sjogrens syndrome-A extractable nuclear antibody, Sjogrens syndrome-B extractable nuclear antibody, Rheumatoid factor, Serum protein electrophoresis with reflex  High risk medication use - Plaquenil 200 mg 1 tablet by mouth twice daily Monday through Friday.  Plaquenil was started after her last office visit on 09/06/2022. CBC and CMP were drawn on 10/15/2022. Her next lab work will be due in May and then every 5 months to monitor for drug toxicity.  No baseline Plaquenil eye examination on file yet. Scheduled to update Plaquenil eye exam in March 2024.    - Plan: COMPLETE METABOLIC PANEL WITH GFR, CBC with Differential/Platelet  Positive ANA (antinuclear antibody) - ANA 1: 1280 nuclear, fine speckled, Ro>8, La>8, RF 15: Lab work from 09/06/2022 was reviewed today in the office: Double-stranded DNA negative, complements within normal limits, ESR 34, RNP negative, Smith antibody negative, beta-2 glycoprotein antibodies  negative, anticardiolipin antibodies negative. Lab work from 06/18/2022 was reviewed today in the office: SPEP did not reveal any abnormal protein bands, Ro and La antibodies remain positive, ANA positive 1:1280 fine speckled.  Plan to update the following lab work with her next follow-up visit and labs due in 3 months.  Future orders were placed today.  Primary osteoarthritis of both hands: PIP and DIP thickening consistent with osteoarthritis of both hands.  No synovitis noted today.  Her joint pain and joint stiffness have improved since  initiating Plaquenil.  Discussed the importance of joint protection and muscle strengthening.  Pain in right hip - X-rays were unremarkable.  Previously under the care of Dr. Durward Fortes.  She had a right trochanteric bursa cortisone injection on 03/28/2022. She had repeat injections recently which were helpful.   Chronic pain of left knee - X-rays of the left knee were obtained on 05/07/2022.  MRI of the left knee was performed on 05/11/2022: Mild tricompartmental degenerative change. No warmth or effusion of knees.   Other fatigue: Improving.    Fibromyalgia: Her energy level has improved.  She has been experiencing less arthralgias and myalgias.    Osteopenia of multiple sites - DEXA 08/30/2019 which showed T score -1.6.  DEXA ordered by PCP in 2020.  Overdue to update DEXA.  Other medical conditions are listed as follows:   Hair loss   Family history of systemic lupus erythematosus (SLE) in mother  Dyslipidemia  Obstructive sleep apnea syndrome  History of depression  Former smoker  Orders: Orders Placed This Encounter  Procedures   COMPLETE METABOLIC PANEL WITH GFR   CBC with Differential/Platelet   Protein / creatinine ratio, urine   C3 and C4   Sedimentation rate   ANA   Sjogrens syndrome-A extractable nuclear antibody   Sjogrens syndrome-B extractable nuclear antibody   Rheumatoid factor   Serum protein electrophoresis with reflex   No  orders of the defined types were placed in this encounter.  .  Follow-Up Instructions: Return in about 3 months (around 01/19/2023) for Sjogren's syndrome, Fibromyalgia.   Ofilia Neas, PA-C  Note - This record has been created using Dragon software.  Chart creation errors have been sought, but may not always  have been located. Such creation errors do not reflect on  the standard of medical care.

## 2022-10-15 ENCOUNTER — Other Ambulatory Visit: Payer: Self-pay | Admitting: *Deleted

## 2022-10-15 DIAGNOSIS — M3501 Sicca syndrome with keratoconjunctivitis: Secondary | ICD-10-CM

## 2022-10-16 LAB — CBC WITH DIFFERENTIAL/PLATELET
Absolute Monocytes: 424 cells/uL (ref 200–950)
Basophils Absolute: 101 cells/uL (ref 0–200)
Basophils Relative: 2.4 %
Eosinophils Absolute: 189 cells/uL (ref 15–500)
Eosinophils Relative: 4.5 %
HCT: 38.9 % (ref 35.0–45.0)
Hemoglobin: 13 g/dL (ref 11.7–15.5)
Lymphs Abs: 1079 cells/uL (ref 850–3900)
MCH: 27 pg (ref 27.0–33.0)
MCHC: 33.4 g/dL (ref 32.0–36.0)
MCV: 80.7 fL (ref 80.0–100.0)
MPV: 10.2 fL (ref 7.5–12.5)
Monocytes Relative: 10.1 %
Neutro Abs: 2407 cells/uL (ref 1500–7800)
Neutrophils Relative %: 57.3 %
Platelets: 242 10*3/uL (ref 140–400)
RBC: 4.82 10*6/uL (ref 3.80–5.10)
RDW: 13.1 % (ref 11.0–15.0)
Total Lymphocyte: 25.7 %
WBC: 4.2 10*3/uL (ref 3.8–10.8)

## 2022-10-16 LAB — COMPLETE METABOLIC PANEL WITH GFR
AG Ratio: 1.1 (calc) (ref 1.0–2.5)
ALT: 4 U/L — ABNORMAL LOW (ref 6–29)
AST: 19 U/L (ref 10–35)
Albumin: 4.1 g/dL (ref 3.6–5.1)
Alkaline phosphatase (APISO): 48 U/L (ref 37–153)
BUN: 18 mg/dL (ref 7–25)
CO2: 27 mmol/L (ref 20–32)
Calcium: 10.2 mg/dL (ref 8.6–10.4)
Chloride: 104 mmol/L (ref 98–110)
Creat: 0.81 mg/dL (ref 0.60–1.00)
Globulin: 3.7 g/dL (calc) (ref 1.9–3.7)
Glucose, Bld: 93 mg/dL (ref 65–99)
Potassium: 4.5 mmol/L (ref 3.5–5.3)
Sodium: 139 mmol/L (ref 135–146)
Total Bilirubin: 0.3 mg/dL (ref 0.2–1.2)
Total Protein: 7.8 g/dL (ref 6.1–8.1)
eGFR: 78 mL/min/{1.73_m2} (ref >=60)

## 2022-10-16 NOTE — Progress Notes (Signed)
CBC and CMP are normal.

## 2022-10-21 ENCOUNTER — Ambulatory Visit: Payer: PPO | Attending: Physician Assistant | Admitting: Physician Assistant

## 2022-10-21 ENCOUNTER — Encounter: Payer: Self-pay | Admitting: Physician Assistant

## 2022-10-21 VITALS — BP 116/77 | HR 81 | Resp 14 | Ht 63.5 in | Wt 183.0 lb

## 2022-10-21 DIAGNOSIS — M19041 Primary osteoarthritis, right hand: Secondary | ICD-10-CM | POA: Diagnosis not present

## 2022-10-21 DIAGNOSIS — M25562 Pain in left knee: Secondary | ICD-10-CM

## 2022-10-21 DIAGNOSIS — Z79899 Other long term (current) drug therapy: Secondary | ICD-10-CM | POA: Diagnosis not present

## 2022-10-21 DIAGNOSIS — R768 Other specified abnormal immunological findings in serum: Secondary | ICD-10-CM | POA: Diagnosis not present

## 2022-10-21 DIAGNOSIS — Z8659 Personal history of other mental and behavioral disorders: Secondary | ICD-10-CM

## 2022-10-21 DIAGNOSIS — Z8269 Family history of other diseases of the musculoskeletal system and connective tissue: Secondary | ICD-10-CM

## 2022-10-21 DIAGNOSIS — M19042 Primary osteoarthritis, left hand: Secondary | ICD-10-CM

## 2022-10-21 DIAGNOSIS — M8589 Other specified disorders of bone density and structure, multiple sites: Secondary | ICD-10-CM

## 2022-10-21 DIAGNOSIS — R5383 Other fatigue: Secondary | ICD-10-CM

## 2022-10-21 DIAGNOSIS — R7689 Other specified abnormal immunological findings in serum: Secondary | ICD-10-CM

## 2022-10-21 DIAGNOSIS — Z87891 Personal history of nicotine dependence: Secondary | ICD-10-CM

## 2022-10-21 DIAGNOSIS — G8929 Other chronic pain: Secondary | ICD-10-CM

## 2022-10-21 DIAGNOSIS — L659 Nonscarring hair loss, unspecified: Secondary | ICD-10-CM

## 2022-10-21 DIAGNOSIS — M3501 Sicca syndrome with keratoconjunctivitis: Secondary | ICD-10-CM

## 2022-10-21 DIAGNOSIS — M25551 Pain in right hip: Secondary | ICD-10-CM

## 2022-10-21 DIAGNOSIS — G4733 Obstructive sleep apnea (adult) (pediatric): Secondary | ICD-10-CM

## 2022-10-21 DIAGNOSIS — M797 Fibromyalgia: Secondary | ICD-10-CM

## 2022-10-21 DIAGNOSIS — E785 Hyperlipidemia, unspecified: Secondary | ICD-10-CM

## 2022-11-21 ENCOUNTER — Ambulatory Visit: Payer: PPO | Admitting: Rheumatology

## 2022-12-09 ENCOUNTER — Other Ambulatory Visit: Payer: Self-pay | Admitting: Physician Assistant

## 2022-12-09 ENCOUNTER — Encounter: Payer: Self-pay | Admitting: *Deleted

## 2022-12-09 NOTE — Telephone Encounter (Signed)
Last Fill: 09/09/2022  Eye exam: not on file   Labs: 10/15/2022 CBC and CMP are normal.   Next Visit: 01/20/2023  Last Visit: 10/21/2022  [id number] syndrome with keratoconjunctivitis sicca   Current Dose per office note 10/21/2022: plaquenil 200 mg 1 tablet by mouth twice daily Monday through Friday, initiated after her last office visit on 09/06/22   Sent message via my chart to ask about her PLQ eye exam.   Okay to refill Plaquenil?

## 2023-01-06 NOTE — Progress Notes (Unsigned)
Office Visit Note  Patient: Kaitlyn Hudson             Date of Birth: 04/20/1951           MRN: [id number]             PCP: Nonnie Done., MD Referring: Nonnie Done., MD Visit Date: 01/20/2023 Occupation: @GUAROCC @  Subjective:  Medication monitoring   History of Present Illness: Kaitlyn Hudson is a 72 y.o. female with history of sjogren's syndrome.  Patient continues to take plaquenil 200 mg 1 tablet by mouth twice daily Monday through Friday.   She is tolerating Plaquenil without any side effects and has not missed any doses recently.  Overall she has noticed a 75% improvement in her symptoms since initiating Plaquenil.  She has had less sicca symptoms and less severe joint pain and joint stiffness.  She has been using less eyedrops and her mouth dryness has improved.  Patient states that she has had a baseline Plaquenil eye examination updated at [facility] eye care.  Patient is worse that she is also seeing her dermatologist in [place] and had a biopsy of a rash on her face as well as her left upper arm.  She states that overall the biopsy results were nonspecific.  She has been using metronidazole gel on her face but has not noticed any improvement in the facial redness.  She has been trying to avoid direct sun exposure. She denies any new concerns.     Activities of Daily Living:  Patient reports morning stiffness for a few minutes.   Patient Denies nocturnal pain.  Difficulty dressing/grooming: Denies Difficulty climbing stairs: Denies Difficulty getting out of chair: Denies Difficulty using hands for taps, buttons, cutlery, and/or writing: Denies  Review of Systems  Constitutional:  Positive for fatigue.  HENT:  Negative for mouth sores and mouth dryness.   Eyes:  Negative for dryness.  Respiratory:  Negative for shortness of breath.   Cardiovascular:  Negative for chest pain and palpitations.  Gastrointestinal:  Negative for blood in stool, constipation and diarrhea.   Endocrine: Negative for increased urination.  Genitourinary:  Negative for involuntary urination.  Musculoskeletal:  Positive for joint pain, joint pain, joint swelling and morning stiffness. Negative for gait problem, myalgias, muscle weakness, muscle tenderness and myalgias.  Skin:  Positive for hair loss. Negative for color change, rash and sensitivity to sunlight.  Allergic/Immunologic: Negative for susceptible to infections.  Neurological:  Negative for dizziness and headaches.  Hematological:  Negative for swollen glands.  Psychiatric/Behavioral:  Negative for depressed mood and sleep disturbance. The patient is not nervous/anxious.     PMFS History:  Patient Active Problem List   Diagnosis Date Noted   Unilateral primary osteoarthritis, left knee 05/23/2022   Acute lateral meniscus tear of left knee 05/07/2022   Pain in right hip 03/28/2022   Trochanteric bursitis, right hip 03/28/2022   CAD in native artery 06/26/2018   Sleep apnea 05/15/2018   Angina pectoris (HCC) 05/15/2018   SOB (shortness of breath) on exertion 05/11/2018   Chest pressure 05/11/2018   Fibromyalgia 05/11/2018   Sjogren's syndrome with keratoconjunctivitis sicca (HCC) 04/08/2018   Former smoker 04/08/2018   Family history of systemic lupus erythematosus (SLE) in mother 04/08/2018   Dyslipidemia 04/08/2018   Primary osteoarthritis of both hands 04/08/2018   History of depression 03/16/2018   History of hyperlipidemia 03/16/2018   Anxiety 04/02/2016   History of cervical dysplasia 04/02/2016   Hyperlipidemia 04/02/2016  Past Medical History:  Diagnosis Date   Allergic rhinitis    Anxiety 04/02/2016   Chest pressure 05/11/2018   Depression    Dyslipidemia 04/08/2018   Exertional shortness of breath 05/11/2018   Family history of systemic lupus erythematosus (SLE) in mother 04/08/2018   Fibromyalgia    Former smoker 04/08/2018   History of cervical dysplasia 04/02/2016   2013; hysterectomy d/t recurrent  dysplasia s/p LEEP   History of depression 03/16/2018   History of hyperlipidemia 03/16/2018   Hypercholesteremia 04/02/2016   Osteoarthritis    Primary osteoarthritis of both hands 04/08/2018   Sjogren's syndrome with keratoconjunctivitis sicca (HCC) 04/08/2018   +ANA, +Ro, +La, sicca symptoms, fatigue   Trochanteric bursitis, right hip 01/09/2018    Family History  Problem Relation Age of Onset   Lupus Mother    Osteoarthritis Mother    Polymyalgia rheumatica Mother    Osteoarthritis Father    Alzheimer's disease Father    Breast cancer Maternal Grandmother    Past Surgical History:  Procedure Laterality Date   CATARACT EXTRACTION, BILATERAL  07/2022   HYSTEROTOMY     KNEE SURGERY Right    LYMPH NODE BIOPSY     groin    NASAL SEPTUM SURGERY  12/2020   Social History   Social History Narrative   Not on file   Immunization History  Administered Date(s) Administered   Influenza, High Dose Seasonal PF 06/24/2018   Moderna Sars-Covid-2 Vaccination 09/23/2019, 10/20/2019, 06/30/2020     Objective: Vital Signs: BP 113/78 (BP Location: Left Arm, Patient Position: Sitting, Cuff Size: Large)   Pulse 75   Resp 15   Ht 5\' 3"  (1.6 m)   Wt 183 lb 6.4 oz (83.2 kg)   BMI 32.49 kg/m    Physical Exam Vitals and nursing note reviewed.  Constitutional:      Appearance: She is well-developed.  HENT:     Head: Normocephalic and atraumatic.  Eyes:     Conjunctiva/sclera: Conjunctivae normal.  Cardiovascular:     Rate and Rhythm: Normal rate and regular rhythm.     Heart sounds: Normal heart sounds.  Pulmonary:     Effort: Pulmonary effort is normal.     Breath sounds: Normal breath sounds.  Abdominal:     General: Bowel sounds are normal.     Palpations: Abdomen is soft.  Musculoskeletal:     Cervical back: Normal range of motion.  Lymphadenopathy:     Cervical: No cervical adenopathy.  Skin:    General: Skin is warm and dry.     Capillary Refill: Capillary refill takes less  than 2 seconds.  Neurological:     Mental Status: She is alert and oriented to person, place, and time.  Psychiatric:        Behavior: Behavior normal.      Musculoskeletal Exam: C-spine, thoracic spine, and lumbar spine good ROM.  Shoulder joints, elbow joints, wrist joints, MCPs, PIPs, and DIPs good ROM with no synovitis.   Complete fist formation bilaterally.  Slightly limited ROM of the right hip.  Limited extension of the right knee.  Left hip has good ROM with no groin pain.  Left knee joint has good ROM with no warmth or effusion.  Ankle joints have good ROM with no tenderness or joint swelling.   CDAI Exam: CDAI Score: -- Patient Global: --; Provider Global: -- Swollen: --; Tender: -- Joint Exam 01/20/2023   No joint exam has been documented for this visit   There  is currently no information documented on the homunculus. Go to the Rheumatology activity and complete the homunculus joint exam.  Investigation: No additional findings.  Imaging: No results found.  Recent Labs: Lab Results  Component Value Date   WBC 4.2 10/15/2022   HGB 13.0 10/15/2022   PLT 242 10/15/2022   NA 139 10/15/2022   K 4.5 10/15/2022   CL 104 10/15/2022   CO2 27 10/15/2022   GLUCOSE 93 10/15/2022   BUN 18 10/15/2022   CREATININE 0.81 10/15/2022   BILITOT 0.3 10/15/2022   AST 19 10/15/2022   ALT 4 (L) 10/15/2022   PROT 7.8 10/15/2022   CALCIUM 10.2 10/15/2022   GFRAA 80 10/24/2020    Speciality Comments: No specialty comments available.  Procedures:  No procedures performed Allergies: Codeine   Assessment / Plan:     Visit Diagnoses: Sjogren's syndrome with keratoconjunctivitis sicca (HCC) - +ANA, +Ro, +La, +RF, sicca symptoms, fatigue: She has noticed over a 75% improvement in her symptoms since initiating Plaquenil in January 2024.  She is been taking Plaquenil 200 mg 1 tablet by mouth twice daily Monday through Friday.  She is tolerating Plaquenil without any side effects.  Her  sicca symptoms have improved.  She has been able to use eyedrops less frequently.  She has also been having less frequent and less severe arthralgias and joint stiffness especially first thing in the mornings.  She had no synovitis on examination today.  Patient has been under the care of vascular dermatology and had a skin biopsy to further evaluate the erythema on her face and upper arms.  Overall the skin biopsies were inconclusive per patient --we will call to obtain biopsy results to review.   Plan to update the following lab work today.  She will remain on Plaquenil as monotherapy.  Discussed the importance of avoiding direct sun exposure and wearing sunscreen SPF 50 on a daily basis.  She was advised to notify us if she develops any new or worsening symptoms.  She will follow-up in the office in 5 months or sooner if needed.- Plan: Protein / creatinine ratio, urine, C3 and C4, Sedimentation rate, Serum protein electrophoresis with reflex, COMPLETE METABOLIC PANEL WITH GFR, CBC with Differential/Platelet, ANA, Rheumatoid factor  High risk medication use - Plaquenil 200 mg 1 tablet by mouth twice daily monday through friday. Patient had a baseline plaquenil eye exam on 12/18/2022 at [facility] eye [facility] in [place].  We will call to obtain these records. CBC and CMP updated on 10/15/22.  CBC and CMP were released today.  - Plan: COMPLETE METABOLIC PANEL WITH GFR, CBC with Differential/Platelet  Positive ANA (antinuclear antibody) - ANA 1: 1280 nuclear, fine speckled, Ro>8, La>8, RF 15: Plan to recheck the following lab work today.  Primary osteoarthritis of both hands: No tenderness or inflammation noted on examination today.  Complete fist formation bilaterally.  She is not experiencing less severe pain and stiffness in both hands especially first thing in the morning since initiating Plaquenil.  Pain in right hip - X-rays were unremarkable.  Previously under the care of Dr. Cleophas Dunker.  She had a right  trochanteric bursa cortisone injection on 03/28/2022.  She has slightly limited range of motion of the right hip joint.  Chronic pain of left knee - X-rays of the left knee were obtained on 05/07/2022.  MRI of the left knee was performed on 05/11/2022: Mild tricompartmental degenerative change.  The left knee joint has good range of motion with no warmth  or effusion on examination today.  Limited extension noted in her right knee.  Other fatigue: Improved.  Fibromyalgia: Her energy level has improved.  She is having less myofascial pain.  Osteopenia of multiple sites - DEXA 08/30/2019 which showed T score -1.6.  DEXA ordered by PCP in 2020.  Overdue to update DEXA.  Other medical conditions are listed as follows:   Hair loss  Family history of systemic lupus erythematosus (SLE) in mother  Dyslipidemia  Obstructive sleep apnea syndrome  History of depression  Former smoker  Orders: Orders Placed This Encounter  Procedures   Protein / creatinine ratio, urine   C3 and C4   Sedimentation rate   Serum protein electrophoresis with reflex   COMPLETE METABOLIC PANEL WITH GFR   CBC with Differential/Platelet   ANA   Rheumatoid factor   No orders of the defined types were placed in this encounter.   Follow-Up Instructions: Return in about 5 months (around 06/22/2023) for Sjogren's syndrome.   Gearldine Bienenstock, PA-C  Note - This record has been created using Dragon software.  Chart creation errors have been sought, but may not always  have been located. Such creation errors do not reflect on  the standard of medical care.

## 2023-01-20 ENCOUNTER — Ambulatory Visit: Payer: PPO | Attending: Physician Assistant | Admitting: Physician Assistant

## 2023-01-20 ENCOUNTER — Encounter: Payer: Self-pay | Admitting: Physician Assistant

## 2023-01-20 VITALS — BP 113/78 | HR 75 | Resp 15 | Ht 63.0 in | Wt 183.4 lb

## 2023-01-20 DIAGNOSIS — R768 Other specified abnormal immunological findings in serum: Secondary | ICD-10-CM

## 2023-01-20 DIAGNOSIS — Z79899 Other long term (current) drug therapy: Secondary | ICD-10-CM | POA: Diagnosis not present

## 2023-01-20 DIAGNOSIS — Z8659 Personal history of other mental and behavioral disorders: Secondary | ICD-10-CM

## 2023-01-20 DIAGNOSIS — M19041 Primary osteoarthritis, right hand: Secondary | ICD-10-CM

## 2023-01-20 DIAGNOSIS — M3501 Sicca syndrome with keratoconjunctivitis: Secondary | ICD-10-CM | POA: Diagnosis not present

## 2023-01-20 DIAGNOSIS — M8589 Other specified disorders of bone density and structure, multiple sites: Secondary | ICD-10-CM

## 2023-01-20 DIAGNOSIS — R5383 Other fatigue: Secondary | ICD-10-CM

## 2023-01-20 DIAGNOSIS — M25562 Pain in left knee: Secondary | ICD-10-CM

## 2023-01-20 DIAGNOSIS — G8929 Other chronic pain: Secondary | ICD-10-CM

## 2023-01-20 DIAGNOSIS — M25551 Pain in right hip: Secondary | ICD-10-CM

## 2023-01-20 DIAGNOSIS — L659 Nonscarring hair loss, unspecified: Secondary | ICD-10-CM

## 2023-01-20 DIAGNOSIS — E785 Hyperlipidemia, unspecified: Secondary | ICD-10-CM

## 2023-01-20 DIAGNOSIS — G4733 Obstructive sleep apnea (adult) (pediatric): Secondary | ICD-10-CM

## 2023-01-20 DIAGNOSIS — M19042 Primary osteoarthritis, left hand: Secondary | ICD-10-CM

## 2023-01-20 DIAGNOSIS — Z8269 Family history of other diseases of the musculoskeletal system and connective tissue: Secondary | ICD-10-CM

## 2023-01-20 DIAGNOSIS — M797 Fibromyalgia: Secondary | ICD-10-CM

## 2023-01-20 DIAGNOSIS — Z87891 Personal history of nicotine dependence: Secondary | ICD-10-CM

## 2023-01-20 LAB — CBC WITH DIFFERENTIAL/PLATELET
Basophils Relative: 1.9 %
Hemoglobin: 12.8 g/dL (ref 11.7–15.5)
Neutro Abs: 2571 cells/uL (ref 1500–7800)
Platelets: 194 10*3/uL (ref 140–400)
Total Lymphocyte: 25.1 %

## 2023-01-20 LAB — SEDIMENTATION RATE: Sed Rate: 25 mm/h (ref 0–30)

## 2023-01-21 LAB — COMPLETE METABOLIC PANEL WITH GFR
ALT: 5 U/L — ABNORMAL LOW (ref 6–29)
Calcium: 9.4 mg/dL (ref 8.6–10.4)
Creat: 0.97 mg/dL (ref 0.60–1.00)
Total Bilirubin: 0.4 mg/dL (ref 0.2–1.2)

## 2023-01-21 LAB — CBC WITH DIFFERENTIAL/PLATELET
Eosinophils Relative: 3.3 %
Lymphs Abs: 1079 cells/uL (ref 850–3900)
MCHC: 31.7 g/dL — ABNORMAL LOW (ref 32.0–36.0)
MPV: 10.3 fL (ref 7.5–12.5)

## 2023-01-21 LAB — C3 AND C4: C3 Complement: 109 mg/dL (ref 83–193)

## 2023-01-21 NOTE — Progress Notes (Signed)
CBC and CMP WNL.  ESR has returned to WNL.  Protein creatinine ratio WNL.  Complements WNL.  RF is borderline positive but titer is trending down.

## 2023-01-23 LAB — PROTEIN ELECTROPHORESIS, SERUM, WITH REFLEX
Alpha 2: 0.6 g/dL (ref 0.5–0.9)
Beta 2: 0.5 g/dL (ref 0.2–0.5)
Gamma Globulin: 1.7 g/dL (ref 0.8–1.7)
Total Protein: 7.5 g/dL (ref 6.1–8.1)

## 2023-01-23 LAB — CBC WITH DIFFERENTIAL/PLATELET
Absolute Monocytes: 426 cells/uL (ref 200–950)
Basophils Absolute: 82 cells/uL (ref 0–200)
MCH: 26.3 pg — ABNORMAL LOW (ref 27.0–33.0)

## 2023-01-23 LAB — COMPLETE METABOLIC PANEL WITH GFR
AST: 35 U/L (ref 10–35)
Alkaline phosphatase (APISO): 49 U/L (ref 37–153)
CO2: 24 mmol/L (ref 20–32)

## 2023-01-23 LAB — C3 AND C4: C4 Complement: 27 mg/dL (ref 15–57)

## 2023-01-23 NOTE — Progress Notes (Signed)
ANA remains positive.  IFE pending

## 2023-01-24 LAB — PROTEIN / CREATININE RATIO, URINE
Creatinine, Urine: 69 mg/dL (ref 20–275)
Protein/Creat Ratio: 72 mg/g creat (ref 24–184)
Protein/Creatinine Ratio: 0.072 mg/mg creat (ref 0.024–0.184)
Total Protein, Urine: 5 mg/dL (ref 5–24)

## 2023-01-24 LAB — COMPLETE METABOLIC PANEL WITH GFR
AG Ratio: 1.1 (calc) (ref 1.0–2.5)
Albumin: 3.9 g/dL (ref 3.6–5.1)
BUN: 21 mg/dL (ref 7–25)
Chloride: 105 mmol/L (ref 98–110)
Globulin: 3.6 g/dL (calc) (ref 1.9–3.7)
Glucose, Bld: 82 mg/dL (ref 65–99)
Potassium: 4.2 mmol/L (ref 3.5–5.3)
Sodium: 137 mmol/L (ref 135–146)
Total Protein: 7.5 g/dL (ref 6.1–8.1)
eGFR: 63 mL/min/{1.73_m2} (ref >=60)

## 2023-01-24 LAB — ANA: Anti Nuclear Antibody (ANA): POSITIVE — AB

## 2023-01-24 LAB — PROTEIN ELECTROPHORESIS, SERUM, WITH REFLEX
Albumin ELP: 3.9 g/dL (ref 3.8–4.8)
Alpha 1: 0.3 g/dL (ref 0.2–0.3)
Beta Globulin: 0.5 g/dL (ref 0.4–0.6)

## 2023-01-24 LAB — CBC WITH DIFFERENTIAL/PLATELET
Eosinophils Absolute: 142 cells/uL (ref 15–500)
HCT: 40.4 % (ref 35.0–45.0)
MCV: 83.1 fL (ref 80.0–100.0)
Monocytes Relative: 9.9 %
Neutrophils Relative %: 59.8 %
RBC: 4.86 10*6/uL (ref 3.80–5.10)
RDW: 13.8 % (ref 11.0–15.0)
WBC: 4.3 10*3/uL (ref 3.8–10.8)

## 2023-01-24 LAB — ANTI-NUCLEAR AB-TITER (ANA TITER): ANA Titer 1: 1:640 {titer} — ABNORMAL HIGH

## 2023-01-24 LAB — RHEUMATOID FACTOR: Rheumatoid fact SerPl-aCnc: 14 IU/mL — ABNORMAL HIGH (ref <14)

## 2023-01-24 LAB — IFE INTERPRETATION

## 2023-01-28 NOTE — Progress Notes (Signed)
IFE normal pattern-no abnormal protein bands.

## 2023-04-23 LAB — LAB REPORT - SCANNED: EGFR: 84

## 2023-06-10 NOTE — Progress Notes (Signed)
Office Visit Note  Patient: Kaitlyn Hudson             Date of Birth: [birth year]           MRN: [id number]             PCP: Nonnie Done., MD Referring: Nonnie Done., MD Visit Date: 06/23/2023 Occupation: @GUAROCC @  Subjective:  Rash   History of Present Illness: Kaitlyn Hudson is a 72 y.o. female with history of sjogren's syndrome and osteoarthritis.  Patient remains on Plaquenil 200 mg 1 tablet by mouth twice daily monday through friday.  She is tolerating Plaquenil without any side effects and has not missed any doses recently.  Patient reports that she has noticed less joint pain, stiffness, and inflammation since initiating Plaquenil.  She continues to have facial redness and rash involving both upper extremities.  She has been under the care of vascular dermatology.  She has had a skin biopsy which revealed inconclusive results.  Patient states that her skin seems dry on her arms but denies any itching or burning of the skin.  She has been avoiding direct sun exposure.  She has been unable to identify a trigger for the rash or any alleviating factors.  She is not currently using any topical agents.  She has not been on prednisone recently.  She denies any symptoms of Raynaud's phenomenon.  She denies any sores in her mouth or nose.  Patient continues to experience right groin pain and has injections every few months.  She denies any other joint pain or joint swelling at this time.  She has noticed increased fatigue over the past several weeks.   Activities of Daily Living:  Patient reports morning stiffness for few minutes  Patient Denies nocturnal pain.  Difficulty dressing/grooming: Denies Difficulty climbing stairs: Denies Difficulty getting out of chair: Denies Difficulty using hands for taps, buttons, cutlery, and/or writing: Denies  Review of Systems  Constitutional:  Positive for fatigue.  HENT:  Negative for mouth sores, mouth dryness and nose dryness.   Eyes:  Negative  for pain, visual disturbance and dryness.  Respiratory:  Negative for cough, hemoptysis, shortness of breath and difficulty breathing.   Cardiovascular:  Negative for chest pain, palpitations, hypertension and swelling in legs/feet.  Gastrointestinal:  Negative for blood in stool, constipation and diarrhea.  Endocrine: Negative for increased urination.  Genitourinary:  Negative for painful urination.  Musculoskeletal:  Positive for joint pain and joint pain. Negative for joint swelling, myalgias, muscle weakness, morning stiffness, muscle tenderness and myalgias.  Skin:  Positive for rash. Negative for color change, pallor, hair loss, nodules/bumps, skin tightness, ulcers and sensitivity to sunlight.  Allergic/Immunologic: Negative for susceptible to infections.  Neurological:  Negative for dizziness, numbness, headaches and weakness.  Hematological:  Negative for swollen glands.  Psychiatric/Behavioral:  Negative for depressed mood and sleep disturbance. The patient is not nervous/anxious.     PMFS History:  Patient Active Problem List   Diagnosis Date Noted   Unilateral primary osteoarthritis, left knee 05/23/2022   Acute lateral meniscus tear of left knee 05/07/2022   Pain in right hip 03/28/2022   Trochanteric bursitis, right hip 03/28/2022   CAD in native artery 06/26/2018   Sleep apnea 05/15/2018   Angina pectoris (HCC) 05/15/2018   SOB (shortness of breath) on exertion 05/11/2018   Chest pressure 05/11/2018   Fibromyalgia 05/11/2018   Sjogren's syndrome with keratoconjunctivitis sicca (HCC) 04/08/2018   Former smoker 04/08/2018   Family  history of systemic lupus erythematosus (SLE) in mother 04/08/2018   Dyslipidemia 04/08/2018   Primary osteoarthritis of both hands 04/08/2018   History of depression 03/16/2018   History of hyperlipidemia 03/16/2018   Anxiety 04/02/2016   History of cervical dysplasia 04/02/2016   Hyperlipidemia 04/02/2016    Past Medical History:   Diagnosis Date   Allergic rhinitis    Anxiety 04/02/2016   Chest pressure 05/11/2018   Depression    Dyslipidemia 04/08/2018   Exertional shortness of breath 05/11/2018   Family history of systemic lupus erythematosus (SLE) in mother 04/08/2018   Fibromyalgia    Former smoker 04/08/2018   History of cervical dysplasia 04/02/2016   2013; hysterectomy d/t recurrent dysplasia s/p LEEP   History of depression 03/16/2018   History of hyperlipidemia 03/16/2018   Hypercholesteremia 04/02/2016   Osteoarthritis    Primary osteoarthritis of both hands 04/08/2018   Sjogren's syndrome with keratoconjunctivitis sicca (HCC) 04/08/2018   +ANA, +Ro, +La, sicca symptoms, fatigue   Trochanteric bursitis, right hip 01/09/2018    Family History  Problem Relation Age of Onset   Lupus Mother    Osteoarthritis Mother    Polymyalgia rheumatica Mother    Osteoarthritis Father    Alzheimer's disease Father    Breast cancer Maternal Grandmother    Past Surgical History:  Procedure Laterality Date   CATARACT EXTRACTION, BILATERAL  07/2022   HYSTEROTOMY     KNEE SURGERY Right    LYMPH NODE BIOPSY     groin    NASAL SEPTUM SURGERY  12/2020   Social History   Social History Narrative   Not on file   Immunization History  Administered Date(s) Administered   Influenza, High Dose Seasonal PF 06/24/2018   Moderna Sars-Covid-2 Vaccination 09/23/2019, 10/20/2019, 06/30/2020     Objective: Vital Signs: BP 118/73 (BP Location: Left Arm, Patient Position: Sitting, Cuff Size: Normal)   Pulse 75   Resp 16   Ht 5\' 3"  (1.6 m)   Wt 185 lb 12.8 oz (84.3 kg)   BMI 32.91 kg/m    Physical Exam Vitals and nursing note reviewed.  Constitutional:      Appearance: She is well-developed.  HENT:     Head: Normocephalic and atraumatic.  Eyes:     Conjunctiva/sclera: Conjunctivae normal.  Cardiovascular:     Rate and Rhythm: Normal rate and regular rhythm.     Heart sounds: Normal heart sounds.  Pulmonary:     Effort:  Pulmonary effort is normal.     Breath sounds: Normal breath sounds.  Abdominal:     General: Bowel sounds are normal.     Palpations: Abdomen is soft.  Musculoskeletal:     Cervical back: Normal range of motion.  Lymphadenopathy:     Cervical: No cervical adenopathy.  Skin:    General: Skin is warm and dry.     Capillary Refill: Capillary refill takes less than 2 seconds.     Comments: Livedo reticularis noted on bilateral UE.   Petechial rash on dorsal aspect of both wrists.  Facial erythema noted   Neurological:     Mental Status: She is alert and oriented to person, place, and time.  Psychiatric:        Behavior: Behavior normal.        Musculoskeletal Exam: C-spine, thoracic spine, lumbar spine have good range of motion.  Shoulder joints, elbow joints, wrist joints, MCPs, PIPs, DIPs have good range of motion with no synovitis.  Complete fist formation bilaterally.  Painful range of motion of the right hip.  Limited extension of the right knee.  Left knee has full range of motion with no warmth or effusion.  Ankle joints have good range of motion no tenderness or joint swelling.  CDAI Exam: CDAI Score: -- Patient Global: --; Provider Global: -- Swollen: --; Tender: -- Joint Exam 06/23/2023   No joint exam has been documented for this visit   There is currently no information documented on the homunculus. Go to the Rheumatology activity and complete the homunculus joint exam.  Investigation: No additional findings.  Imaging: No results found.  Recent Labs: Lab Results  Component Value Date   WBC 4.3 01/20/2023   HGB 12.8 01/20/2023   PLT 194 01/20/2023   NA 137 01/20/2023   K 4.2 01/20/2023   CL 105 01/20/2023   CO2 24 01/20/2023   GLUCOSE 82 01/20/2023   BUN 21 01/20/2023   CREATININE 0.97 01/20/2023   BILITOT 0.4 01/20/2023   AST 35 01/20/2023   ALT 5 (L) 01/20/2023   PROT 7.5 01/20/2023   PROT 7.5 01/20/2023   CALCIUM 9.4 01/20/2023   GFRAA 80  10/24/2020    Speciality Comments: No specialty comments available.  Procedures:  No procedures performed Allergies: Codeine       Assessment / Plan:     Visit Diagnoses: Sjogren's syndrome with keratoconjunctivitis sicca (HCC) - +ANA, +Ro, +La, +RF, sicca symptoms, fatigue: She has not been experiencing any increased sicca symptoms.  No oral or nasal ulcerations.  She has no synovitis on examination today.  Her biggest concern has been ongoing fatigue as well as a rash involving her face and upper extremities.  She underwent a punch biopsy on 12/12/2022 for rash involving her face and her upper extremities-facial rash was compatible with rosacea and the rash on her arms was compatible with perivascular dermatitis. Her facial rash had no improvement with the use of metronidazole gel.  She has not been using any topical agents on her arms.  She has not noticed any improvement while taking Plaquenil.  She has been avoiding direct sun exposure. Plan to obtain the following lab work today for further evaluation including ESR and Pan-ANCA.  She will remain on Plaquenil as prescribed for now.  She was advised to notify us if she develops any new or worsening symptoms.  Plan to review attached photos and lab work once available with Dr. Corliss Skains.   - Plan: CBC with Differential/Platelet, COMPLETE METABOLIC PANEL WITH GFR, Protein / creatinine ratio, urine, Sedimentation rate, C3 and C4, Serum protein electrophoresis with reflex, Rheumatoid factor, Sjogrens syndrome-A extractable nuclear antibody, Sjogrens syndrome-B extractable nuclear antibody, Pan-ANCA  High risk medication use - Plaquenil 200 mg 1 tablet by mouth twice daily monday through friday.  Patient had a baseline plaquenil eye exam on 12/18/2022 at [facility] in [place]. CBC and CMP updated on 01/20/23. Future orders for CBC and CMP placed today.    - Plan: CBC with Differential/Platelet, COMPLETE METABOLIC PANEL WITH GFR  Rash and  other nonspecific skin eruption -Patient has a rash involving bilateral upper extremities and face.  She has been under the care of [facility] dermatology.  She underwent a punch biopsy on 12/12/2022 involving her face as well as her left upper extremity.  The rash on her face was consistent with changes compatible with rosacea.  The rash on her upper arm was consistent with perivascular dermatitis. Her face is diffusely erythematous.  Both upper extremities have a livedo  reticularis pattern as well as some petechiae on the dorsal aspect of both wrists. Photos attached above.  Patient was prescribed metronidazole gel which she tried on her face but had no improvement and has discontinued.  She has not been using any topical agents on her arms.  She has not noticed any aggravating or alleviating factors for the rash.  She has not noticed any improvement with Plaquenil.  Plan to obtain the following lab work for further evaluation.  I will also discussed the patient's case and show Dr. Corliss Skains the attached photos to get her recommendations.  plan: Pan-ANCA  Positive ANA (antinuclear antibody) - ANA 1: 1280 nuclear, fine speckled, Ro>8, La>8, RF 15: Plan to obtain the following lab work for further evaluation.  - Plan: CBC with Differential/Platelet, COMPLETE METABOLIC PANEL WITH GFR, Protein / creatinine ratio, urine, Sedimentation rate, C3 and C4, Serum protein electrophoresis with reflex, Rheumatoid factor, Sjogrens syndrome-A extractable nuclear antibody, Sjogrens syndrome-B extractable nuclear antibody, Pan-ANCA  Primary osteoarthritis of both hands: PIP and DIP thickening consistent with osteoarthritis of both hands. Complete fist formation bilaterally.    Pain in right hip - X-rays were unremarkable.  Previously under the care of Dr. Cleophas Dunker.  She had a right trochanteric bursa cortisone injection on 03/28/2022. Chronic pain.   Chronic pain of left knee - X-rays of the left knee were obtained on  05/07/2022.  MRI of the left knee was performed on 05/11/2022: Mild tricompartmental degenerative change.  Good ROM with no warmth or effusion.   Other fatigue: She has been experiencing some increased fatigue over the past several weeks.  Fibromyalgia: Overall her pain from fibromyalgia has improved.  She has not had any recent flares.  Osteopenia of multiple sites - DEXA 08/30/2019 which showed T score -1.6.  DEXA ordered by PCP in 2020.  Overdue to update DEXA.  Other medical conditions are listed as follows:   Hair loss  Family history of systemic lupus erythematosus (SLE) in mother  Dyslipidemia  Obstructive sleep apnea syndrome  History of depression  Former smoker    Orders: Orders Placed This Encounter  Procedures   CBC with Differential/Platelet   COMPLETE METABOLIC PANEL WITH GFR   Protein / creatinine ratio, urine   Sedimentation rate   C3 and C4   Serum protein electrophoresis with reflex   Rheumatoid factor   Sjogrens syndrome-A extractable nuclear antibody   Sjogrens syndrome-B extractable nuclear antibody   Pan-ANCA   No orders of the defined types were placed in this encounter.   Follow-Up Instructions: Return in about 5 months (around 11/21/2023) for Sjogren's syndrome, Osteoarthritis.   Gearldine Bienenstock, PA-C  Note - This record has been created using Dragon software.  Chart creation errors have been sought, but may not always  have been located. Such creation errors do not reflect on  the standard of medical care.

## 2023-06-19 ENCOUNTER — Other Ambulatory Visit: Payer: Self-pay | Admitting: Physician Assistant

## 2023-06-23 ENCOUNTER — Encounter: Payer: Self-pay | Admitting: Physician Assistant

## 2023-06-23 ENCOUNTER — Ambulatory Visit: Payer: PPO | Attending: Physician Assistant | Admitting: Physician Assistant

## 2023-06-23 VITALS — BP 118/73 | HR 75 | Resp 16 | Ht 63.0 in | Wt 185.8 lb

## 2023-06-23 DIAGNOSIS — M3501 Sicca syndrome with keratoconjunctivitis: Secondary | ICD-10-CM | POA: Diagnosis not present

## 2023-06-23 DIAGNOSIS — M25562 Pain in left knee: Secondary | ICD-10-CM

## 2023-06-23 DIAGNOSIS — Z79899 Other long term (current) drug therapy: Secondary | ICD-10-CM

## 2023-06-23 DIAGNOSIS — R5383 Other fatigue: Secondary | ICD-10-CM

## 2023-06-23 DIAGNOSIS — G4733 Obstructive sleep apnea (adult) (pediatric): Secondary | ICD-10-CM

## 2023-06-23 DIAGNOSIS — Z8659 Personal history of other mental and behavioral disorders: Secondary | ICD-10-CM

## 2023-06-23 DIAGNOSIS — Z8269 Family history of other diseases of the musculoskeletal system and connective tissue: Secondary | ICD-10-CM

## 2023-06-23 DIAGNOSIS — M19042 Primary osteoarthritis, left hand: Secondary | ICD-10-CM

## 2023-06-23 DIAGNOSIS — R768 Other specified abnormal immunological findings in serum: Secondary | ICD-10-CM | POA: Diagnosis not present

## 2023-06-23 DIAGNOSIS — G8929 Other chronic pain: Secondary | ICD-10-CM

## 2023-06-23 DIAGNOSIS — R21 Rash and other nonspecific skin eruption: Secondary | ICD-10-CM

## 2023-06-23 DIAGNOSIS — M25551 Pain in right hip: Secondary | ICD-10-CM

## 2023-06-23 DIAGNOSIS — M797 Fibromyalgia: Secondary | ICD-10-CM

## 2023-06-23 DIAGNOSIS — E785 Hyperlipidemia, unspecified: Secondary | ICD-10-CM

## 2023-06-23 DIAGNOSIS — M8589 Other specified disorders of bone density and structure, multiple sites: Secondary | ICD-10-CM

## 2023-06-23 DIAGNOSIS — Z87891 Personal history of nicotine dependence: Secondary | ICD-10-CM

## 2023-06-23 DIAGNOSIS — M19041 Primary osteoarthritis, right hand: Secondary | ICD-10-CM | POA: Diagnosis not present

## 2023-06-23 DIAGNOSIS — L659 Nonscarring hair loss, unspecified: Secondary | ICD-10-CM

## 2023-06-23 NOTE — Patient Instructions (Signed)
Standing Labs We placed an order today for your standing lab work.   Please have your standing labs drawn in 1 week   Please have your labs drawn 2 weeks prior to your appointment so that the provider can discuss your lab results at your appointment, if possible.  Please note that you may see your imaging and lab results in MyChart before we have reviewed them. We will contact you once all results are reviewed. Please allow our office up to 72 hours to thoroughly review all of the results before contacting the office for clarification of your results.  WALK-IN LAB HOURS  Monday through Thursday from 8:00 am -12:30 pm and 1:00 pm-5:00 pm and Friday from 8:00 am-12:00 pm.  Patients with office visits requiring labs will be seen before walk-in labs.  You may encounter longer than normal wait times. Please allow additional time. Wait times may be shorter on  Monday and Thursday afternoons.  We do not book appointments for walk-in labs. We appreciate your patience and understanding with our staff.   Labs are drawn by [facility]. Please bring your co-pay at the time of your lab draw.  You may receive a bill from [facility] for your lab work.  Please note if you are on Hydroxychloroquine and and an order has been placed for a Hydroxychloroquine level,  you will need to have it drawn 4 hours or more after your last dose.  If you wish to have your labs drawn at another location, please call the office 24 hours in advance so we can fax the orders.  The office is located at [address], [address], [place], [place] [address]   If you have any questions regarding directions or hours of operation,  please call [phone].   As a reminder, please drink plenty of water prior to coming for your lab work. Thanks!

## 2023-06-25 ENCOUNTER — Other Ambulatory Visit: Payer: Self-pay | Admitting: *Deleted

## 2023-06-25 DIAGNOSIS — R21 Rash and other nonspecific skin eruption: Secondary | ICD-10-CM

## 2023-06-25 DIAGNOSIS — R768 Other specified abnormal immunological findings in serum: Secondary | ICD-10-CM

## 2023-06-25 DIAGNOSIS — Z79899 Other long term (current) drug therapy: Secondary | ICD-10-CM

## 2023-06-25 DIAGNOSIS — M3501 Sicca syndrome with keratoconjunctivitis: Secondary | ICD-10-CM

## 2023-06-26 NOTE — Progress Notes (Signed)
WBC count is low-3.0.  absolute lymphocytes are low.  Rest of CBC stable.   CMP WNL ESR WNL Protein creatinine ratio Wnl RF negative Complements WNL

## 2023-06-27 NOTE — Progress Notes (Signed)
Ro and la antibodies remain positive.

## 2023-06-30 NOTE — Progress Notes (Signed)
SPEP did not reveal any abnormal protein bands.   Pan-ANCA pending

## 2023-07-01 LAB — COMPLETE METABOLIC PANEL WITH GFR
AG Ratio: 1.1 (calc) (ref 1.0–2.5)
ALT: 6 U/L (ref 6–29)
AST: 28 U/L (ref 10–35)
Albumin: 3.8 g/dL (ref 3.6–5.1)
Alkaline phosphatase (APISO): 51 U/L (ref 37–153)
BUN: 14 mg/dL (ref 7–25)
CO2: 24 mmol/L (ref 20–32)
Calcium: 9.4 mg/dL (ref 8.6–10.4)
Chloride: 106 mmol/L (ref 98–110)
Creat: 0.8 mg/dL (ref 0.60–1.00)
Globulin: 3.6 g/dL (ref 1.9–3.7)
Glucose, Bld: 86 mg/dL (ref 65–99)
Potassium: 4.4 mmol/L (ref 3.5–5.3)
Sodium: 139 mmol/L (ref 135–146)
Total Bilirubin: 0.4 mg/dL (ref 0.2–1.2)
Total Protein: 7.4 g/dL (ref 6.1–8.1)
eGFR: 79 mL/min/{1.73_m2} (ref >=60)

## 2023-07-01 LAB — SEDIMENTATION RATE: Sed Rate: 19 mm/h (ref 0–30)

## 2023-07-01 LAB — PROTEIN ELECTROPHORESIS, SERUM, WITH REFLEX
Albumin ELP: 4 g/dL (ref 3.8–4.8)
Alpha 1: 0.3 g/dL (ref 0.2–0.3)
Alpha 2: 0.6 g/dL (ref 0.5–0.9)
Beta 2: 0.4 g/dL (ref 0.2–0.5)
Beta Globulin: 0.5 g/dL (ref 0.4–0.6)
Gamma Globulin: 1.7 g/dL (ref 0.8–1.7)
Total Protein: 7.5 g/dL (ref 6.1–8.1)

## 2023-07-01 LAB — CBC WITH DIFFERENTIAL/PLATELET
Absolute Lymphocytes: 822 {cells}/uL — ABNORMAL LOW (ref 850–3900)
Absolute Monocytes: 321 {cells}/uL (ref 200–950)
Basophils Absolute: 60 {cells}/uL (ref 0–200)
Basophils Relative: 2 %
Eosinophils Absolute: 180 {cells}/uL (ref 15–500)
Eosinophils Relative: 6 %
HCT: 39.5 % (ref 35.0–45.0)
Hemoglobin: 12.4 g/dL (ref 11.7–15.5)
MCH: 26.2 pg — ABNORMAL LOW (ref 27.0–33.0)
MCHC: 31.4 g/dL — ABNORMAL LOW (ref 32.0–36.0)
MCV: 83.5 fL (ref 80.0–100.0)
MPV: 10.2 fL (ref 7.5–12.5)
Monocytes Relative: 10.7 %
Neutro Abs: 1617 {cells}/uL (ref 1500–7800)
Neutrophils Relative %: 53.9 %
Platelets: 193 10*3/uL (ref 140–400)
RBC: 4.73 10*6/uL (ref 3.80–5.10)
RDW: 13 % (ref 11.0–15.0)
Total Lymphocyte: 27.4 %
WBC: 3 10*3/uL — ABNORMAL LOW (ref 3.8–10.8)

## 2023-07-01 LAB — PAN-ANCA
ANCA SCREEN: NEGATIVE
Myeloperoxidase Abs: <1 AI (ref <1.0)
Serine Protease 3: <1 AI (ref <1.0)

## 2023-07-01 LAB — SJOGRENS SYNDROME-A EXTRACTABLE NUCLEAR ANTIBODY: SSA (Ro) (ENA) Antibody, IgG: >8 AI — AB

## 2023-07-01 LAB — C3 AND C4
C3 Complement: 111 mg/dL (ref 83–193)
C4 Complement: 19 mg/dL (ref 15–57)

## 2023-07-01 LAB — PROTEIN / CREATININE RATIO, URINE
Creatinine, Urine: 82 mg/dL (ref 20–275)
Protein/Creat Ratio: 98 mg/g{creat} (ref 24–184)
Protein/Creatinine Ratio: 0.098 mg/mg{creat} (ref 0.024–0.184)
Total Protein, Urine: 8 mg/dL (ref 5–24)

## 2023-07-01 LAB — RHEUMATOID FACTOR: Rheumatoid fact SerPl-aCnc: 13 [IU]/mL (ref <14)

## 2023-07-01 LAB — SJOGRENS SYNDROME-B EXTRACTABLE NUCLEAR ANTIBODY: SSB (La) (ENA) Antibody, IgG: 7.8 AI — AB

## 2023-07-02 ENCOUNTER — Other Ambulatory Visit: Payer: Self-pay | Admitting: *Deleted

## 2023-07-02 MED ORDER — HYDROXYCHLOROQUINE SULFATE 200 MG PO TABS: ORAL_TABLET | ORAL | 0 refills | Status: DC

## 2023-07-02 NOTE — Telephone Encounter (Signed)
Patient needs RF on Plaquenil once plaquenil eye exam is received from [facility] in [place].

## 2023-07-02 NOTE — Addendum Note (Signed)
Addended by: Henriette Combs on: 07/02/2023 10:24 AM   Modules accepted: Orders

## 2023-07-02 NOTE — Progress Notes (Signed)
ANCA screen is negative Labs are not consistent with vasculitis.

## 2023-07-02 NOTE — Telephone Encounter (Signed)
Last Fill: 12/09/2022  Eye exam: 04/01/2023 WNL   Labs: 06/25/2023 WBC count is low-3.0.  absolute lymphocytes are low.  Rest of CBC stable.   CMP WNL  Next Visit: 11/25/2023  Last Visit: 06/23/2023  [id number] syndrome with keratoconjunctivitis sicca   Current Dose per office note 06/23/2023: Plaquenil 200 mg 1 tablet by mouth twice daily monday through friday.   Okay to refill Plaquenil?

## 2023-07-21 ENCOUNTER — Encounter: Payer: Self-pay | Admitting: Gastroenterology

## 2023-07-21 ENCOUNTER — Ambulatory Visit (AMBULATORY_SURGERY_CENTER): Payer: PPO

## 2023-07-21 ENCOUNTER — Other Ambulatory Visit: Payer: Self-pay

## 2023-07-21 VITALS — Ht 63.5 in | Wt 180.0 lb

## 2023-07-21 DIAGNOSIS — Z1211 Encounter for screening for malignant neoplasm of colon: Secondary | ICD-10-CM

## 2023-07-21 MED ORDER — NA SULFATE-K SULFATE-MG SULF 17.5-3.13-1.6 GM/177ML PO SOLN: 1.0000 | Freq: Once | ORAL | 0 refills | Status: AC

## 2023-07-21 NOTE — Progress Notes (Signed)
Denies allergies to eggs or soy products. Denies complication of anesthesia or sedation. Denies use of weight loss medication. Denies use of O2.   Emmi instructions given for colonoscopy.  

## 2023-08-15 ENCOUNTER — Telehealth: Payer: Self-pay | Admitting: Gastroenterology

## 2023-08-15 NOTE — Telephone Encounter (Signed)
Inbound call from patient stating that she is scheduled for a colonoscopy on 12/17 and has developed a  respiratory infection. Patient is requesting a call to discuss if she should reschedule. Please advise.

## 2023-08-15 NOTE — Telephone Encounter (Signed)
Pt is sick states it may be a sinus infection that turns into bronchitis. Not sure she has fever but feels like she has a low grade one. Pt said if goes to Bronchitis that it is usually 2 weeks before she feels better. RN suggested she reschedule letting her know that her PV is good for 3 months. Pt states she will call and reschedule procedure for her safety.

## 2023-08-19 ENCOUNTER — Encounter: Payer: Self-pay | Admitting: Gastroenterology

## 2023-08-19 ENCOUNTER — Ambulatory Visit (AMBULATORY_SURGERY_CENTER): Payer: PPO | Admitting: Gastroenterology

## 2023-08-19 VITALS — BP 99/39 | HR 80 | Temp 97.4°F | Resp 19 | Ht 63.0 in | Wt 180.0 lb

## 2023-08-19 DIAGNOSIS — Z1211 Encounter for screening for malignant neoplasm of colon: Secondary | ICD-10-CM | POA: Diagnosis not present

## 2023-08-19 DIAGNOSIS — K640 First degree hemorrhoids: Secondary | ICD-10-CM

## 2023-08-19 DIAGNOSIS — K5730 Diverticulosis of large intestine without perforation or abscess without bleeding: Secondary | ICD-10-CM

## 2023-08-19 MED ORDER — SODIUM CHLORIDE 0.9 % IV SOLN: 500.0000 mL | Freq: Once | INTRAVENOUS | Status: DC

## 2023-08-19 NOTE — Progress Notes (Signed)
Pt's states no medical or surgical changes since previsit or office visit. 

## 2023-08-19 NOTE — Progress Notes (Signed)
To pacu, VSS. Report to RN.tb 

## 2023-08-19 NOTE — Op Note (Signed)
[facility] Patient Name: Kaitlyn Hudson Procedure Date: 08/19/2023 9:09 AM MRN: [id number] Endoscopist: Lynann Bologna , MD, [id number] Age: 72 Referring MD:  Date of Birth: 08/16/1951 Gender: Female Account #: [id number] Procedure:                Colonoscopy Indications:              Screening for colorectal malignant neoplasm Medicines:                Monitored Anesthesia Care Procedure:                Pre-Anesthesia Assessment:                           - Prior to the procedure, a History and Physical                            was performed, and patient medications and                            allergies were reviewed. The patient's tolerance of                            previous anesthesia was also reviewed. The risks                            and benefits of the procedure and the sedation                            options and risks were discussed with the patient.                            All questions were answered, and informed consent                            was obtained. Prior Anticoagulants: The patient has                            taken no anticoagulant or antiplatelet agents. ASA                            Grade Assessment: II - A patient with mild systemic                            disease. After reviewing the risks and benefits,                            the patient was deemed in satisfactory condition to                            undergo the procedure.                           After obtaining informed consent, the colonoscope  was passed under direct vision. Throughout the                            procedure, the patient's blood pressure, pulse, and                            oxygen saturations were monitored continuously. The                            Olympus Scope SN [id number] was introduced through the                            anus and advanced to the the cecum, identified by                            appendiceal  orifice and ileocecal valve. The                            colonoscopy was performed without difficulty. The                            patient tolerated the procedure well. The quality                            of the bowel preparation was good. The ileocecal                            valve, appendiceal orifice, and rectum were                            photographed. Scope In: 9:16:31 AM Scope Out: 9:24:28 AM Scope Withdrawal Time: 0 hours 5 minutes 26 seconds  Total Procedure Duration: 0 hours 7 minutes 57 seconds  Findings:                 Multiple medium-mouthed diverticula were found in                            the sigmoid colon. Rare diverticula in the                            descending colon.                           Non-bleeding internal hemorrhoids were found during                            retroflexion. The hemorrhoids were small and Grade                            I (internal hemorrhoids that do not prolapse).                           The exam was otherwise without abnormality on  direct and retroflexion views. Complications:            No immediate complications. Estimated Blood Loss:     Estimated blood loss: none. Impression:               - Moderate predominantly sigmoid diverticulosis                           - Non-bleeding internal hemorrhoids.                           - The examination was otherwise normal on direct                            and retroflexion views.                           - No specimens collected. Recommendation:           - Patient has a contact number available for                            emergencies. The signs and symptoms of potential                            delayed complications were discussed with the                            patient. Return to normal activities tomorrow.                            Written discharge instructions were provided to the                            patient.                            - High fiber diet. If hard stools, start any fiber.                           - Continue present medications.                           - Repeat colonoscopy is not recommended for                            screening purposes.                           - The findings and recommendations were discussed                            with the patient's family. Lynann Bologna, MD 08/19/2023 9:28:43 AM This report has been signed electronically.

## 2023-08-19 NOTE — Patient Instructions (Signed)
Educational handout provided to patient related to Hemorrhoids, High fiber diet, and Diverticulosis  High Fiber Diet  Continue present medications  Repeat colonoscopy is not recommended for screening purposes   YOU HAD AN ENDOSCOPIC PROCEDURE TODAY AT THE [facility]:   Refer to the procedure report that was given to you for any specific questions about what was found during the examination.  If the procedure report does not answer your questions, please call your gastroenterologist to clarify.  If you requested that your care partner not be given the details of your procedure findings, then the procedure report has been included in a sealed envelope for you to review at your convenience later.  YOU SHOULD EXPECT: Some feelings of bloating in the abdomen. Passage of more gas than usual.  Walking can help get rid of the air that was put into your GI tract during the procedure and reduce the bloating. If you had a lower endoscopy (such as a colonoscopy or flexible sigmoidoscopy) you may notice spotting of blood in your stool or on the toilet paper. If you underwent a bowel prep for your procedure, you may not have a normal bowel movement for a few days.  Please Note:  You might notice some irritation and congestion in your nose or some drainage.  This is from the oxygen used during your procedure.  There is no need for concern and it should clear up in a day or so.  SYMPTOMS TO REPORT IMMEDIATELY:  Following lower endoscopy (colonoscopy or flexible sigmoidoscopy):  Excessive amounts of blood in the stool  Significant tenderness or worsening of abdominal pains  Swelling of the abdomen that is new, acute  Fever of 100F or higher  For urgent or emergent issues, a gastroenterologist can be reached at any hour by calling [phone]. Do not use MyChart messaging for urgent concerns.    DIET:  We do recommend a small meal at first, but then you may proceed to your regular diet.   Drink plenty of fluids but you should avoid alcoholic beverages for 24 hours.  ACTIVITY:  You should plan to take it easy for the rest of today and you should NOT DRIVE or use heavy machinery until tomorrow (because of the sedation medicines used during the test).    FOLLOW UP: Our staff will call the number listed on your records the next business day following your procedure.  We will call around 7:15- 8:00 am to check on you and address any questions or concerns that you may have regarding the information given to you following your procedure. If we do not reach you, we will leave a message.     If any biopsies were taken you will be contacted by phone or by letter within the next 1-3 weeks.  Please call us at [phone] if you have not heard about the biopsies in 3 weeks.    SIGNATURES/CONFIDENTIALITY: You and/or your care partner have signed paperwork which will be entered into your electronic medical record.  These signatures attest to the fact that that the information above on your After Visit Summary has been reviewed and is understood.  Full responsibility of the confidentiality of this discharge information lies with you and/or your care-partner.

## 2023-08-19 NOTE — Progress Notes (Signed)
[facility] History and Physical   Primary Care Physician:  Nonnie Done., MD   Reason for Procedure:   CRC screeing  Plan:    colon     HPI: Kaitlyn Hudson is a 72 y.o. female    Past Medical History:  Diagnosis Date   Allergic rhinitis    Allergy    Anxiety 04/02/2016   Cataract    Chest pressure 05/11/2018   Depression    Dyslipidemia 04/08/2018   Exertional shortness of breath 05/11/2018   Family history of systemic lupus erythematosus (SLE) in mother 04/08/2018   Fibromyalgia    Former smoker 04/08/2018   GERD (gastroesophageal reflux disease)    History of cervical dysplasia 04/02/2016   2013; hysterectomy d/t recurrent dysplasia s/p LEEP   History of depression 03/16/2018   History of hyperlipidemia 03/16/2018   Hypercholesteremia 04/02/2016   Osteoarthritis    Primary osteoarthritis of both hands 04/08/2018   Sjogren's syndrome with keratoconjunctivitis sicca (HCC) 04/08/2018   +ANA, +Ro, +La, sicca symptoms, fatigue   Sleep apnea    Trochanteric bursitis, right hip 01/09/2018    Past Surgical History:  Procedure Laterality Date   CATARACT EXTRACTION, BILATERAL  07/2022   HYSTEROTOMY     KNEE SURGERY Right    LYMPH NODE BIOPSY     groin    NASAL SEPTUM SURGERY  12/2020    Prior to Admission medications   Medication Sig Start Date End Date Taking? Authorizing Provider  Acetaminophen (TYLENOL PO) Take by mouth as needed.   Yes [provider]  aspirin EC 81 MG tablet Take 1 tablet (81 mg total) by mouth daily. 05/15/18  Yes Baldo Daub, MD  cetirizine (ZYRTEC) 10 MG tablet Take 10 mg by mouth daily.   Yes [provider]  Esomeprazole Magnesium (NEXIUM PO) Take 40 mg by mouth daily.   Yes [provider]  finasteride (PROSCAR) 5 MG tablet Take 5 mg by mouth daily. 06/25/22  Yes [provider]  hydroxychloroquine (PLAQUENIL) 200 MG tablet TAKE ONE TABLET BY MOUTH TWICE DAILY MONDAY THROUGH FRIDAY  07/02/23  Yes Deveshwar, Janalyn Rouse, MD  Multiple Vitamins-Minerals (HAIR SKIN & NAILS ADVANCED PO)  06/25/22  Yes [provider]  Polyethyl Glycol-Propyl Glycol (SYSTANE OP) Apply to eye.   Yes [provider]  pravastatin (PRAVACHOL) 20 MG tablet Take 20 mg by mouth daily.   Yes [provider]  RESTASIS 0.05 % ophthalmic emulsion  03/29/22  Yes [provider]  sertraline (ZOLOFT) 100 MG tablet Take 100 mg by mouth daily.    Yes [provider]    Current Outpatient Medications  Medication Sig Dispense Refill   Acetaminophen (TYLENOL PO) Take by mouth as needed.     aspirin EC 81 MG tablet Take 1 tablet (81 mg total) by mouth daily. 90 tablet 3   cetirizine (ZYRTEC) 10 MG tablet Take 10 mg by mouth daily.     Esomeprazole Magnesium (NEXIUM PO) Take 40 mg by mouth daily.     finasteride (PROSCAR) 5 MG tablet Take 5 mg by mouth daily.     hydroxychloroquine (PLAQUENIL) 200 MG tablet TAKE ONE TABLET BY MOUTH TWICE DAILY MONDAY THROUGH FRIDAY 120 tablet 0   Multiple Vitamins-Minerals (HAIR SKIN & NAILS ADVANCED PO)      Polyethyl Glycol-Propyl Glycol (SYSTANE OP) Apply to eye.     pravastatin (PRAVACHOL) 20 MG tablet Take 20 mg by mouth daily.     RESTASIS 0.05 % ophthalmic  emulsion      sertraline (ZOLOFT) 100 MG tablet Take 100 mg by mouth daily.      Current Facility-Administered Medications  Medication Dose Route Frequency Provider Last Rate Last Admin   0.9 %  sodium chloride infusion  500 mL Intravenous Once Lynann Bologna, MD        Allergies as of 08/19/2023 - Review Complete 08/19/2023  Allergen Reaction Noted   Codeine Nausea And Vomiting 09/29/2017    Family History  Problem Relation Age of Onset   Lupus Mother    Osteoarthritis Mother    Polymyalgia rheumatica Mother    Osteoarthritis Father    Alzheimer's disease Father    Breast cancer Maternal Grandmother    Colon cancer Neg Hx    Esophageal cancer Neg Hx    Rectal cancer  Neg Hx    Stomach cancer Neg Hx     Social History   Socioeconomic History   Marital status: Widowed    Spouse name: Not on file   Number of children: Not on file   Years of education: Not on file   Highest education level: Not on file  Occupational History   Not on file  Tobacco Use   Smoking status: Former    Current packs/day: 0.00    Average packs/day: 0.5 packs/day for 30.0 years (15.0 ttl pk-yrs)    Types: Cigarettes    Start date: 09/02/1980    Quit date: 09/02/2010    Years since quitting: 12.9    Passive exposure: Never   Smokeless tobacco: Never  Vaping Use   Vaping status: Never Used  Substance and Sexual Activity   Alcohol use: No   Drug use: Never   Sexual activity: Not on file  Other Topics Concern   Not on file  Social History Narrative   Not on file   Social Drivers of Health   Financial Resource Strain: Not on file  Food Insecurity: Not on file  Transportation Needs: Not on file  Physical Activity: Not on file  Stress: Not on file  Social Connections: Not on file  Intimate Partner Violence: Not on file    Review of Systems: Positive for none All other review of systems negative except as mentioned in the HPI.  Physical Exam: Vital signs in last 24 hours: @VSRANGES @   General:   Alert,  Well-developed, well-nourished, pleasant and cooperative in NAD Lungs:  Clear throughout to auscultation.   Heart:  Regular rate and rhythm; no murmurs, clicks, rubs,  or gallops. Abdomen:  Soft, nontender and nondistended. Normal bowel sounds.   Neuro/Psych:  Alert and cooperative. Normal mood and affect. A and O x 3    No significant changes were identified.  The patient continues to be an appropriate candidate for the planned procedure and anesthesia.   Edman Circle, MD. [facility] 08/19/2023 9:08 AM@

## 2023-08-20 ENCOUNTER — Telehealth: Payer: Self-pay

## 2023-08-20 NOTE — Telephone Encounter (Signed)
  Follow up Call-     08/19/2023    7:55 AM  Call back number  Post procedure Call Back phone  # [phone]  Permission to leave phone message Yes     Patient questions:  Do you have a fever, pain , or abdominal swelling? No. Pain Score  0 *  Have you tolerated food without any problems? Yes.    Have you been able to return to your normal activities? Yes.    Do you have any questions about your discharge instructions: Diet   No. Medications  No. Follow up visit  No.  Do you have questions or concerns about your Care? No.  Actions: * If pain score is 4 or above: No action needed, pain <4.

## 2023-11-11 ENCOUNTER — Ambulatory Visit: Payer: PPO | Admitting: Sports Medicine

## 2023-11-11 ENCOUNTER — Encounter: Payer: Self-pay | Admitting: Sports Medicine

## 2023-11-11 ENCOUNTER — Other Ambulatory Visit: Payer: Self-pay

## 2023-11-11 ENCOUNTER — Other Ambulatory Visit (INDEPENDENT_AMBULATORY_CARE_PROVIDER_SITE_OTHER): Payer: Self-pay

## 2023-11-11 DIAGNOSIS — M25551 Pain in right hip: Secondary | ICD-10-CM

## 2023-11-11 DIAGNOSIS — S76011D Strain of muscle, fascia and tendon of right hip, subsequent encounter: Secondary | ICD-10-CM | POA: Diagnosis not present

## 2023-11-11 DIAGNOSIS — M1611 Unilateral primary osteoarthritis, right hip: Secondary | ICD-10-CM

## 2023-11-11 MED ORDER — METHYLPREDNISOLONE ACETATE 40 MG/ML IJ SUSP
80.0000 mg | INTRAMUSCULAR | Status: AC | PRN
  Administered 2023-11-11: 80 mg via INTRA_ARTICULAR

## 2023-11-11 MED ORDER — LIDOCAINE HCL 1 % IJ SOLN
4.0000 mL | INTRAMUSCULAR | Status: AC | PRN
  Administered 2023-11-11: 4 mL

## 2023-11-11 NOTE — Progress Notes (Signed)
 Patient says that she got great relief from her hip injection last year. She says that over the last several weeks she has been having pain in the front of the hip and groin that has gotten progressively worse. She says that she is also having some pain over the lateral hip, but this is not as severe as the front. She has not had any injuries in the time since her last visit. She denies any pain, numbness, or tingling that goes down the leg. She takes Tylenol once a day, typically at night to help her sleep as she will get pain when rolling over in bed.

## 2023-11-11 NOTE — Progress Notes (Signed)
 Kaitlyn Hudson - 73 y.o. female MRN [id number]  Date of birth: 05-Dec-1950  Office Visit Note: Visit Date: 11/11/2023 PCP: Nonnie Done., MD Referred by: Nonnie Done., MD  Subjective: Chief Complaint  Patient presents with   Right Hip - Pain   HPI: Kaitlyn Hudson is a pleasant 73 y.o. female who presents today for follow-up of acute on chronic right hip pain with OA.   She does have known arthritis about the hip.  We did perform a right hip injection under ultrasound guidance back in January 2024 which gave her good relief for about 1 year but starting in December/January her pain started to return.  She notices pain in the hip but also over the side of the hip and some limited mobility.  She does use Tylenol about once daily.  Pertinent ROS were reviewed with the patient and found to be negative unless otherwise specified above in HPI.   Assessment & Plan: Visit Diagnoses:  1. Pain in right hip   2. Unilateral primary osteoarthritis, right hip   3. Tear of right gluteus minimus tendon, subsequent encounter    Plan: Impression is exacerbation of chronic right hip pain with advanced right hip osteoarthritis.  She does have some pain over the lateral aspect of the hip with prior MRI evidence of gluteus minimus partial tearing/tendinopathy but more of her pain is emanating from the intra-articular hip.  Through shared decision making, we did proceed with ultrasound-guided intra-articular injection, patient tolerated well.  Advised on 48 hours of modified rest/activity.  She may use Tylenol and ice for any postinjection pain.  I would like to see her back in about 1 month to see her response and better evaluate her range of motion and strengthening about the hip flexors and abductors, we discussed possibly getting her into some form of physical therapy in addition depending on her reevaluation.  Follow-up: Return in about 1 month (around 12/12/2023) for For right hip f/u .   Meds &  Orders: No orders of the defined types were placed in this encounter.   Orders Placed This Encounter  Procedures   Large Joint Inj   XR HIP UNILAT W OR W/O PELVIS 2-3 VIEWS RIGHT   US Guided Needle Placement - No Linked Charges     Procedures: Large Joint Inj: R hip joint on 11/11/2023 3:05 PM Indications: pain Details: 22 G 3.5 in needle, ultrasound-guided anterior approach Medications: 4 mL lidocaine 1 %; 80 mg methylPREDNISolone acetate 40 MG/ML Outcome: tolerated well, no immediate complications  Procedure: US-guided intra-articular hip injection, right After discussion on risks/benefits/indications and informed verbal consent was obtained, a timeout was performed. Patient was lying supine on exam table. The hip was cleaned with betadine and alcohol swabs. Then utilizing ultrasound guidance, the patient's femoral head and neck junction was identified and subsequently injected with 4:2 lidocaine:depomedrol via an in-plane approach with ultrasound visualization of the injectate administered into the hip joint. Patient tolerated procedure well without immediate complications.  Procedure, treatment alternatives, risks and benefits explained, specific risks discussed. Consent was given by the patient. Immediately prior to procedure a time out was called to verify the correct patient, procedure, equipment, support staff and site/side marked as required. Patient was prepped and draped in the usual sterile fashion.          Clinical History: No specialty comments available.  She reports that she quit smoking about 13 years ago. Her smoking use included cigarettes. She started smoking about  43 years ago. She has a 15 pack-year smoking history. She has never been exposed to tobacco smoke. She has never used smokeless tobacco. No results for input(s): "HGBA1C", "LABURIC" in the last 8760 hours.  Objective:    Physical Exam  Gen: Well-appearing, in no acute distress; non-toxic CV:  Well-perfused. Warm.  Resp: Breathing unlabored on room air; no wheezing. Psych: Fluid speech in conversation; appropriate affect; normal thought process  Ortho Exam - Right hip: + Mild TTP over the greater trochanteric region.  There is limitation in pain with internal and external log FADIR test, positive Stinchfield test.  Imaging: XR HIP UNILAT W OR W/O PELVIS 2-3 VIEWS RIGHT Result Date: 11/11/2023 2 views of the right hip including AP pelvis and right hip lateral film were ordered and reviewed by myself today.  X-rays demonstrate MR head well-seated within the lung.  The superior lateral aspect of the joint is relatively well-preserved but there is advanced arthritic change with near bone-on-bone change over the inferior medial aspect of the joint with bony sclerosis of the inferior acetabulum.    Narrative & Impression  CLINICAL DATA:  Right hip pain radiating to the right groin for the past 3 months. No injury or prior surgery.   EXAM: MR OF THE RIGHT HIP WITHOUT CONTRAST   TECHNIQUE: Multiplanar, multisequence MR imaging was performed. No intravenous contrast was administered.   COMPARISON:  Right hip x-rays dated Jan 22, 2022.   FINDINGS: Bones: There is no evidence of acute fracture, dislocation or avascular necrosis. No focal bone lesion. Moderate degenerative changes of the pubic symphysis with parasymphyseal marrow edema. The sacroiliac joints are unremarkable. Degenerative disc disease at L4-L5 and L5-S1.   Articular cartilage and labrum   Articular cartilage: Scattered high-grade partial-thickness cartilage loss in the right hip joint. No subchondral signal abnormality.   Labrum: Grossly intact, although evaluation is limited due to lack of intra-articular fluid. No paralabral abnormality.   Joint or bursal effusion   Joint effusion: Small right hip joint effusion.   Bursae: No focal periarticular fluid collection.   Muscles and tendons   Muscles and  tendons: Tendinosis and partial tearing of the left greater than right gluteus minimus tendons. Mild bilateral hamstring origin tendinosis. The iliopsoas tendons are unremarkable. Mild atrophy of the bilateral gluteus minimus muscles. No muscle edema.   Other findings   Miscellaneous: Prior hysterectomy. Left-sided colonic diverticulosis. Prominent bilateral external iliac and inguinal lymph nodes are likely reactive.   IMPRESSION: 1. Mild right hip osteoarthritis. No acute osseous abnormality. 2. Tendinosis and partial tearing of the left greater than right gluteus minimus tendons. 3. Mild bilateral hamstring origin tendinosis. 4. Moderate degenerative changes of the pubic symphysis.     Electronically Signed   By: Obie Dredge M.D.   On: 04/10/2022 11:55    Past Medical/Family/Surgical/Social History: Medications & Allergies reviewed per EMR, new medications updated. Patient Active Problem List   Diagnosis Date Noted   Unilateral primary osteoarthritis, left knee 05/23/2022   Acute lateral meniscus tear of left knee 05/07/2022   Pain in right hip 03/28/2022   Trochanteric bursitis, right hip 03/28/2022   CAD in native artery 06/26/2018   Sleep apnea 05/15/2018   Angina pectoris (HCC) 05/15/2018   SOB (shortness of breath) on exertion 05/11/2018   Chest pressure 05/11/2018   Fibromyalgia 05/11/2018   Sjogren's syndrome with keratoconjunctivitis sicca (HCC) 04/08/2018   Former smoker 04/08/2018   Family history of systemic lupus erythematosus (SLE) in mother 04/08/2018  Dyslipidemia 04/08/2018   Primary osteoarthritis of both hands 04/08/2018   History of depression 03/16/2018   History of hyperlipidemia 03/16/2018   Anxiety 04/02/2016   History of cervical dysplasia 04/02/2016   Hyperlipidemia 04/02/2016   Past Medical History:  Diagnosis Date   Allergic rhinitis    Allergy    Anxiety 04/02/2016   Cataract    Chest pressure 05/11/2018   Depression     Dyslipidemia 04/08/2018   Exertional shortness of breath 05/11/2018   Family history of systemic lupus erythematosus (SLE) in mother 04/08/2018   Fibromyalgia    Former smoker 04/08/2018   GERD (gastroesophageal reflux disease)    History of cervical dysplasia 04/02/2016   2013; hysterectomy d/t recurrent dysplasia s/p LEEP   History of depression 03/16/2018   History of hyperlipidemia 03/16/2018   Hypercholesteremia 04/02/2016   Osteoarthritis    Primary osteoarthritis of both hands 04/08/2018   Sjogren's syndrome with keratoconjunctivitis sicca (HCC) 04/08/2018   +ANA, +Ro, +La, sicca symptoms, fatigue   Sleep apnea    Trochanteric bursitis, right hip 01/09/2018   Family History  Problem Relation Age of Onset   Lupus Mother    Osteoarthritis Mother    Polymyalgia rheumatica Mother    Osteoarthritis Father    Alzheimer's disease Father    Breast cancer Maternal Grandmother    Colon cancer Neg Hx    Esophageal cancer Neg Hx    Rectal cancer Neg Hx    Stomach cancer Neg Hx    Past Surgical History:  Procedure Laterality Date   CATARACT EXTRACTION, BILATERAL  07/2022   HYSTEROTOMY     KNEE SURGERY Right    LYMPH NODE BIOPSY     groin    NASAL SEPTUM SURGERY  12/2020   Social History   Occupational History   Not on file  Tobacco Use   Smoking status: Former    Current packs/day: 0.00    Average packs/day: 0.5 packs/day for 30.0 years (15.0 ttl pk-yrs)    Types: Cigarettes    Start date: 09/02/1980    Quit date: 09/02/2010    Years since quitting: 13.2    Passive exposure: Never   Smokeless tobacco: Never  Vaping Use   Vaping status: Never Used  Substance and Sexual Activity   Alcohol use: No   Drug use: Never   Sexual activity: Not on file

## 2023-11-12 NOTE — Progress Notes (Signed)
 Office Visit Note  Patient: Kaitlyn Hudson             Date of Birth: 10/26/1950           MRN: [id number]             PCP: Nonnie Done., MD Referring: Nonnie Done., MD Visit Date: 11/25/2023 Occupation: @GUAROCC @  Subjective:  Right hip pain  History of Present Illness: Kaitlyn Hudson is a 73 y.o. female with Sjogren's and osteoarthritis.  She returns today after her last visit on June 23, 2023.  She states she continues to have dry mouth and dry eye symptoms.  She has been using over-the-counter products which are helpful.  She continues to take hydroxychloroquine 200 mg twice daily Monday to Friday without any interruption.  Eye examination was normal on March 09, 2016 2024.  She is concerned about the discoloration on her skin.  She states she continues to have pain and discomfort in her right hip.  She was evaluated by Dr. Shon Baton and had cortisone injection to right hip joint which gave her temporary relief.  She was advised physical therapy.  She continues to have some stiffness in her hands and her right knee.  None of the other joints are painful or swollen.  She denies any shortness of breath or lymphadenopathy.    Activities of Daily Living:  Patient reports morning stiffness for 30 minutes.   Patient Denies nocturnal pain.  Difficulty dressing/grooming: Denies Difficulty climbing stairs: Denies Difficulty getting out of chair: Denies Difficulty using hands for taps, buttons, cutlery, and/or writing: Reports  Review of Systems  Constitutional:  Positive for fatigue.  HENT:  Positive for mouth dryness. Negative for mouth sores.   Eyes:  Positive for dryness.  Respiratory:  Negative for shortness of breath.   Cardiovascular:  Negative for chest pain and palpitations.  Gastrointestinal:  Negative for blood in stool, constipation and diarrhea.  Endocrine: Negative for increased urination.  Genitourinary:  Negative for involuntary urination.  Musculoskeletal:  Positive  for joint pain, joint pain, morning stiffness and muscle tenderness. Negative for gait problem, joint swelling, myalgias, muscle weakness and myalgias.  Skin:  Positive for color change, hair loss and sensitivity to sunlight. Negative for rash.  Allergic/Immunologic: Negative for susceptible to infections.  Neurological:  Negative for dizziness and headaches.  Hematological:  Negative for swollen glands.  Psychiatric/Behavioral:  Negative for depressed mood and sleep disturbance. The patient is not nervous/anxious.     PMFS History:  Patient Active Problem List   Diagnosis Date Noted   Unilateral primary osteoarthritis, left knee 05/23/2022   Acute lateral meniscus tear of left knee 05/07/2022   Pain in right hip 03/28/2022   Trochanteric bursitis, right hip 03/28/2022   CAD in native artery 06/26/2018   Sleep apnea 05/15/2018   Angina pectoris (HCC) 05/15/2018   SOB (shortness of breath) on exertion 05/11/2018   Chest pressure 05/11/2018   Fibromyalgia 05/11/2018   Sjogren's syndrome with keratoconjunctivitis sicca (HCC) 04/08/2018   Former smoker 04/08/2018   Family history of systemic lupus erythematosus (SLE) in mother 04/08/2018   Dyslipidemia 04/08/2018   Primary osteoarthritis of both hands 04/08/2018   History of depression 03/16/2018   History of hyperlipidemia 03/16/2018   Anxiety 04/02/2016   History of cervical dysplasia 04/02/2016   Hyperlipidemia 04/02/2016    Past Medical History:  Diagnosis Date   Allergic rhinitis    Allergy    Anxiety 04/02/2016   Cataract  Chest pressure 05/11/2018   Depression    Dyslipidemia 04/08/2018   Exertional shortness of breath 05/11/2018   Family history of systemic lupus erythematosus (SLE) in mother 04/08/2018   Fibromyalgia    Former smoker 04/08/2018   GERD (gastroesophageal reflux disease)    History of cervical dysplasia 04/02/2016   2013; hysterectomy d/t recurrent dysplasia s/p LEEP   History of depression  03/16/2018   History of hyperlipidemia 03/16/2018   Hypercholesteremia 04/02/2016   Osteoarthritis    Primary osteoarthritis of both hands 04/08/2018   Sjogren's syndrome with keratoconjunctivitis sicca (HCC) 04/08/2018   +ANA, +Ro, +La, sicca symptoms, fatigue   Sleep apnea    Trochanteric bursitis, right hip 01/09/2018    Family History  Problem Relation Age of Onset   Lupus Mother    Osteoarthritis Mother    Polymyalgia rheumatica Mother    Osteoarthritis Father    Alzheimer's disease Father    Breast cancer Maternal Grandmother    Colon cancer Neg Hx    Esophageal cancer Neg Hx    Rectal cancer Neg Hx    Stomach cancer Neg Hx    Past Surgical History:  Procedure Laterality Date   CATARACT EXTRACTION, BILATERAL  07/2022   HYSTEROTOMY     KNEE SURGERY Right    LYMPH NODE BIOPSY     groin    NASAL SEPTUM SURGERY  12/2020   Social History   Social History Narrative   Not on file   Immunization History  Administered Date(s) Administered   Influenza, High Dose Seasonal PF 06/24/2018   Moderna Sars-Covid-2 Vaccination 09/23/2019, 10/20/2019, 06/30/2020     Objective: Vital Signs: BP 104/66 (BP Location: Left Arm, Patient Position: Sitting, Cuff Size: Normal)   Pulse 78   Resp 15   Ht 5\' 3"  (1.6 m)   Wt 183 lb (83 kg)   BMI 32.42 kg/m    Physical Exam Vitals and nursing note reviewed.  Constitutional:      Appearance: She is well-developed.  HENT:     Head: Normocephalic and atraumatic.  Eyes:     Conjunctiva/sclera: Conjunctivae normal.  Cardiovascular:     Rate and Rhythm: Normal rate and regular rhythm.     Heart sounds: Normal heart sounds.  Pulmonary:     Effort: Pulmonary effort is normal.     Breath sounds: Normal breath sounds.  Abdominal:     General: Bowel sounds are normal.     Palpations: Abdomen is soft.  Musculoskeletal:     Cervical back: Normal range of motion.  Lymphadenopathy:     Cervical: No cervical adenopathy.  Skin:     General: Skin is warm and dry.     Capillary Refill: Capillary refill takes 2 to 3 seconds.     Comments: Livedo reticularis was noted over bilateral upper extremities.  Mild facial erythema was noted.  No sclerodactyly, nailbed capillary changes or telangiectasia were noted.  Neurological:     Mental Status: She is alert and oriented to person, place, and time.  Psychiatric:        Behavior: Behavior normal.      Musculoskeletal Exam: Cervical, thoracic and lumbar spine with good range of motion.  Shoulders, elbows, wrists, MCPs PIPs and DIPs with good range of motion.  No synovitis was noted.  Mild PIP and DIP thickening was noted.  As she had limited range of motion of her right hip joint and limited extension of her right knee joint.  No warmth swelling or effusion  was noted.  There was no tenderness over ankles or MTPs.  CDAI Exam: CDAI Score: -- Patient Global: --; Provider Global: -- Swollen: --; Tender: -- Joint Exam 11/25/2023   No joint exam has been documented for this visit   There is currently no information documented on the homunculus. Go to the Rheumatology activity and complete the homunculus joint exam.  Investigation: No additional findings.  Imaging: XR HIP UNILAT W OR W/O PELVIS 2-3 VIEWS RIGHT Result Date: 11/11/2023 2 views of the right hip including AP pelvis and right hip lateral film were ordered and reviewed by myself today.  X-rays demonstrate MR head well-seated within the lung.  The superior lateral aspect of the joint is relatively well-preserved but there is advanced arthritic change with near bone-on-bone change over the inferior medial aspect of the joint with bony sclerosis of the inferior acetabulum.    Recent Labs: Lab Results  Component Value Date   WBC 3.0 (L) 06/25/2023   HGB 12.4 06/25/2023   PLT 193 06/25/2023   NA 139 06/25/2023   K 4.4 06/25/2023   CL 106 06/25/2023   CO2 24 06/25/2023   GLUCOSE 86 06/25/2023   BUN 14 06/25/2023    CREATININE 0.80 06/25/2023   BILITOT 0.4 06/25/2023   AST 28 06/25/2023   ALT 6 06/25/2023   PROT 7.5 06/25/2023   PROT 7.4 06/25/2023   CALCIUM 9.4 06/25/2023   GFRAA 80 10/24/2020     Speciality Comments: PLQ Eye Exam 03/19/2023  WNL, DR. Rico Sheehan, OD Follow up 1 year  Procedures:  No procedures performed Allergies: Codeine   Assessment / Plan:     Visit Diagnoses: Sjogren's syndrome with keratoconjunctivitis sicca (HCC) - +ANA, +Ro, +La, +RF, sicca symptoms, fatigue: -She continues to have dry mouth and dry eye symptoms.  Over-the-counter products were discussed.  June 25 2023 SPEP normal, urine protein creatinine ratio normal, ANCA negative, MPO negative, serine proteinase 3 negative, complements normal, SSA positive, SSB positive, RF negative, sed rate normal.  Lab results were reviewed with the patient.  Will recheck labs today.  Plan: Protein / creatinine ratio, urine, Anti-DNA antibody, double-stranded, C3 and C4, Sedimentation rate.  Patient denies any shortness of breath or lymphadenopathy.  Lungs were clear to auscultation.  High risk medication use - Plaquenil 200 mg 1 tablet by mouth twice daily monday through friday.  PLQ Eye Exam 03/19/2023 - Plan: CBC with Differential/Platelet, COMPLETE METABOLIC PANEL WITH GFR today.  Positive ANA (antinuclear antibody) - ANA 1: 1280 nuclear, fine speckled, Ro>8, La>8, RF 15: Recent rheumatoid factor was negative.  Livedo reticularis -patient is concerned about the discoloration on her upper extremities.  The findings were characteristic of livedo reticularis.  She had decreased capillary refill without any nailbed capillary changes or sclerodactyly.  She had biopsy in the past which showed perivascular dermatitis..  Primary osteoarthritis of both hands-she had bilateral PIP and DIP thickening with no synovitis.  Primary osteoarthritis of right hip -she was recently evaluated by orthopedics.  I reviewed her previous x-rays which  showed severe osteoarthritis of the right hip joint.  She had cortisone injection which gave her temporary relief.  She will be undergoing physical therapy.  She may need total hip replacement in the future.  She had limited range of motion.    Chronic pain of left knee -she had limited extension of the right knee joint.  No warmth swelling or effusion was noted.  X-rays of the left knee were obtained on 05/07/2022.  MRI of the left knee was performed on 05/11/2022: Mild tricompartmental degenerative change.  Other fatigue-she continues to have some fatigue.  Fibromyalgia-she has generalized pain from fibromyalgia which is manageable currently.  Osteopenia of multiple sites - DEXA 08/30/2019 which showed T score -1.6.  DEXA ordered by PCP in 2020.  Overdue to update DEXA.  She will discuss repeat DEXA scan with her PCP.  Hair loss-she can use to have some hair loss.  Other medical problems are listed as follows:  Family history of systemic lupus erythematosus (SLE) in mother  Dyslipidemia  Obstructive sleep apnea syndrome  History of depression  Former smoker  Orders: Orders Placed This Encounter  Procedures   Protein / creatinine ratio, urine   CBC with Differential/Platelet   COMPLETE METABOLIC PANEL WITH GFR   Anti-DNA antibody, double-stranded   C3 and C4   Sedimentation rate   No orders of the defined types were placed in this encounter.   Follow-Up Instructions: Return in about 5 months (around 04/26/2024) for Sjogren's.   Pollyann Savoy, MD  Note - This record has been created using Animal nutritionist.  Chart creation errors have been sought, but may not always  have been located. Such creation errors do not reflect on  the standard of medical care.

## 2023-11-25 ENCOUNTER — Encounter: Payer: Self-pay | Admitting: Rheumatology

## 2023-11-25 ENCOUNTER — Other Ambulatory Visit: Payer: Self-pay | Admitting: Sports Medicine

## 2023-11-25 ENCOUNTER — Ambulatory Visit: Payer: PPO | Attending: Rheumatology | Admitting: Rheumatology

## 2023-11-25 VITALS — BP 104/66 | HR 78 | Resp 15 | Ht 63.0 in | Wt 183.0 lb

## 2023-11-25 DIAGNOSIS — M8589 Other specified disorders of bone density and structure, multiple sites: Secondary | ICD-10-CM

## 2023-11-25 DIAGNOSIS — M1611 Unilateral primary osteoarthritis, right hip: Secondary | ICD-10-CM

## 2023-11-25 DIAGNOSIS — L659 Nonscarring hair loss, unspecified: Secondary | ICD-10-CM

## 2023-11-25 DIAGNOSIS — M19041 Primary osteoarthritis, right hand: Secondary | ICD-10-CM

## 2023-11-25 DIAGNOSIS — Z79899 Other long term (current) drug therapy: Secondary | ICD-10-CM

## 2023-11-25 DIAGNOSIS — M25551 Pain in right hip: Secondary | ICD-10-CM

## 2023-11-25 DIAGNOSIS — R21 Rash and other nonspecific skin eruption: Secondary | ICD-10-CM

## 2023-11-25 DIAGNOSIS — E785 Hyperlipidemia, unspecified: Secondary | ICD-10-CM

## 2023-11-25 DIAGNOSIS — R5383 Other fatigue: Secondary | ICD-10-CM

## 2023-11-25 DIAGNOSIS — M19042 Primary osteoarthritis, left hand: Secondary | ICD-10-CM

## 2023-11-25 DIAGNOSIS — Z87891 Personal history of nicotine dependence: Secondary | ICD-10-CM

## 2023-11-25 DIAGNOSIS — R768 Other specified abnormal immunological findings in serum: Secondary | ICD-10-CM | POA: Diagnosis not present

## 2023-11-25 DIAGNOSIS — Z8659 Personal history of other mental and behavioral disorders: Secondary | ICD-10-CM

## 2023-11-25 DIAGNOSIS — R231 Pallor: Secondary | ICD-10-CM

## 2023-11-25 DIAGNOSIS — M3501 Sicca syndrome with keratoconjunctivitis: Secondary | ICD-10-CM | POA: Diagnosis not present

## 2023-11-25 DIAGNOSIS — Z8269 Family history of other diseases of the musculoskeletal system and connective tissue: Secondary | ICD-10-CM

## 2023-11-25 DIAGNOSIS — M25562 Pain in left knee: Secondary | ICD-10-CM

## 2023-11-25 DIAGNOSIS — M797 Fibromyalgia: Secondary | ICD-10-CM

## 2023-11-25 DIAGNOSIS — G8929 Other chronic pain: Secondary | ICD-10-CM

## 2023-11-25 DIAGNOSIS — G4733 Obstructive sleep apnea (adult) (pediatric): Secondary | ICD-10-CM

## 2023-11-25 NOTE — Patient Instructions (Signed)

## 2023-11-26 NOTE — Progress Notes (Signed)
 WBC count is low but improved.  CMP is normal.  Urine protein creatinine ratio normal.  Sedimentation rate normal complements normal double-stranded ENA is pending.  Labs do not indicate an autoimmune disease flare.

## 2023-11-27 LAB — COMPREHENSIVE METABOLIC PANEL WITH GFR
AG Ratio: 1.2 (calc) (ref 1.0–2.5)
ALT: 7 U/L (ref 6–29)
AST: 24 U/L (ref 10–35)
Albumin: 4.1 g/dL (ref 3.6–5.1)
Alkaline phosphatase (APISO): 54 U/L (ref 37–153)
BUN: 15 mg/dL (ref 7–25)
CO2: 26 mmol/L (ref 20–32)
Calcium: 9.7 mg/dL (ref 8.6–10.4)
Chloride: 104 mmol/L (ref 98–110)
Creat: 0.77 mg/dL (ref 0.60–1.00)
Globulin: 3.4 g/dL (ref 1.9–3.7)
Glucose, Bld: 99 mg/dL (ref 65–99)
Potassium: 4.3 mmol/L (ref 3.5–5.3)
Sodium: 138 mmol/L (ref 135–146)
Total Bilirubin: 0.5 mg/dL (ref 0.2–1.2)
Total Protein: 7.5 g/dL (ref 6.1–8.1)
eGFR: 82 mL/min/{1.73_m2} (ref >=60)

## 2023-11-27 LAB — CBC WITH DIFFERENTIAL/PLATELET
Absolute Lymphocytes: 1117 {cells}/uL (ref 850–3900)
Absolute Monocytes: 363 {cells}/uL (ref 200–950)
Basophils Absolute: 81 {cells}/uL (ref 0–200)
Basophils Relative: 2.2 %
Eosinophils Absolute: 192 {cells}/uL (ref 15–500)
Eosinophils Relative: 5.2 %
HCT: 41 % (ref 35.0–45.0)
Hemoglobin: 12.9 g/dL (ref 11.7–15.5)
MCH: 26.2 pg — ABNORMAL LOW (ref 27.0–33.0)
MCHC: 31.5 g/dL — ABNORMAL LOW (ref 32.0–36.0)
MCV: 83.3 fL (ref 80.0–100.0)
MPV: 10.4 fL (ref 7.5–12.5)
Monocytes Relative: 9.8 %
Neutro Abs: 1946 {cells}/uL (ref 1500–7800)
Neutrophils Relative %: 52.6 %
Platelets: 203 10*3/uL (ref 140–400)
RBC: 4.92 10*6/uL (ref 3.80–5.10)
RDW: 13.6 % (ref 11.0–15.0)
Total Lymphocyte: 30.2 %
WBC: 3.7 10*3/uL — ABNORMAL LOW (ref 3.8–10.8)

## 2023-11-27 LAB — C3 AND C4
C3 Complement: 123 mg/dL (ref 83–193)
C4 Complement: 26 mg/dL (ref 15–57)

## 2023-11-27 LAB — PROTEIN / CREATININE RATIO, URINE
Creatinine, Urine: 61 mg/dL (ref 20–275)
Protein/Creat Ratio: 98 mg/g{creat} (ref 24–184)
Protein/Creatinine Ratio: 0.098 mg/mg{creat} (ref 0.024–0.184)
Total Protein, Urine: 6 mg/dL (ref 5–24)

## 2023-11-27 LAB — ANTI-DNA ANTIBODY, DOUBLE-STRANDED: ds DNA Ab: 1 [IU]/mL

## 2023-11-27 LAB — SEDIMENTATION RATE: Sed Rate: 22 mm/h (ref 0–30)

## 2023-11-27 NOTE — Progress Notes (Signed)
Double stranded DNA negative.

## 2023-12-12 ENCOUNTER — Ambulatory Visit: Admitting: Sports Medicine

## 2023-12-12 ENCOUNTER — Encounter: Payer: Self-pay | Admitting: Sports Medicine

## 2023-12-12 DIAGNOSIS — S76011D Strain of muscle, fascia and tendon of right hip, subsequent encounter: Secondary | ICD-10-CM

## 2023-12-12 DIAGNOSIS — R29898 Other symptoms and signs involving the musculoskeletal system: Secondary | ICD-10-CM

## 2023-12-12 DIAGNOSIS — M1611 Unilateral primary osteoarthritis, right hip: Secondary | ICD-10-CM | POA: Diagnosis not present

## 2023-12-12 NOTE — Progress Notes (Signed)
 Kaitlyn Hudson - 73 y.o. female MRN [id number]  Date of birth: [birth year]  Office Visit Note: Visit Date: 12/12/2023 PCP: Nonnie Done., MD Referred by: Nonnie Done., MD  Subjective: Chief Complaint  Patient presents with   Right Hip - Follow-up   HPI: Kaitlyn Hudson is a pleasant 73 y.o. female who presents today for chronic right hip pain with known OA.  [name] did receive good relief from the intra-articular hip injection 1 month ago.  Feels like she is at least 75% improved some pain in the groin area.  After the injection more of her pain has been over the lateral aspect of the hip.  She uses Tylenol for pain control.  Pertinent ROS were reviewed with the patient and found to be negative unless otherwise specified above in HPI.   Assessment & Plan: Visit Diagnoses:  1. Unilateral primary osteoarthritis, right hip   2. Tear of right gluteus minimus tendon, subsequent encounter   3. Weakness of right hip    Plan: Impression is chronic right hip pain which is multifactorial in nature.  She does have advanced osteoarthritis over the inferior medial aspect of the hip joint.  This could get largely better with intra-articular hip injection.  Now more of her pain is emanating from the lateral hip with previous MRI confirmed partial tearing of the gluteus minimus tendon.  She does have weakness with hip abduction.  At this point, we will get her started in formalized physical therapy to work on stability about the hip joint and strengthening/stabilization of her hip abductor musculature.  We did discuss the role for a greater trochanteric injection only if pain is limiting her when she progresses through physical therapy.  We will follow-up in about 6 weeks, she may call or return sooner if needed.  Follow-up: Return in about 6 weeks (around 01/23/2024) for For R-hip .   Meds & Orders: No orders of the defined types were placed in this encounter.   Orders Placed This Encounter   Procedures   Ambulatory referral to Physical Therapy     Procedures: No procedures performed      Clinical History: No specialty comments available.  She reports that she quit smoking about 13 years ago. Her smoking use included cigarettes. She started smoking about 43 years ago. She has a 15 pack-year smoking history. She has never been exposed to tobacco smoke. She has never used smokeless tobacco. No results for input(s): "HGBA1C", "LABURIC" in the last 8760 hours.  Objective:    Physical Exam  Gen: Well-appearing, in no acute distress; non-toxic CV: Well-perfused. Warm.  Resp: Breathing unlabored on room air; no wheezing. Psych: Fluid speech in conversation; appropriate affect; normal thought process  Ortho Exam - Right hip: + TTP over the greater trochanteric region.  There is pain and limitation with internal logroll.  Positive FADIR.  Patient has weakness with resisted hip abduction on the right with 3.5/5 strength compared to 4.5/5 strength of the contralateral hip.  Mild Trendelenburg gait.  Imaging: No results found.  Past Medical/Family/Surgical/Social History: Medications & Allergies reviewed per EMR, new medications updated. Patient Active Problem List   Diagnosis Date Noted   Unilateral primary osteoarthritis, left knee 05/23/2022   Acute lateral meniscus tear of left knee 05/07/2022   Pain in right hip 03/28/2022   Trochanteric bursitis, right hip 03/28/2022   CAD in native artery 06/26/2018   Sleep apnea 05/15/2018   Angina pectoris (HCC) 05/15/2018   SOB (shortness  of breath) on exertion 05/11/2018   Chest pressure 05/11/2018   Fibromyalgia 05/11/2018   Sjogren's syndrome with keratoconjunctivitis sicca (HCC) 04/08/2018   Former smoker 04/08/2018   Family history of systemic lupus erythematosus (SLE) in mother 04/08/2018   Dyslipidemia 04/08/2018   Primary osteoarthritis of both hands 04/08/2018   History of depression 03/16/2018   History of  hyperlipidemia 03/16/2018   Anxiety 04/02/2016   History of cervical dysplasia 04/02/2016   Hyperlipidemia 04/02/2016   Past Medical History:  Diagnosis Date   Allergic rhinitis    Allergy    Anxiety 04/02/2016   Cataract    Chest pressure 05/11/2018   Depression    Dyslipidemia 04/08/2018   Exertional shortness of breath 05/11/2018   Family history of systemic lupus erythematosus (SLE) in mother 04/08/2018   Fibromyalgia    Former smoker 04/08/2018   GERD (gastroesophageal reflux disease)    History of cervical dysplasia 04/02/2016   2013; hysterectomy d/t recurrent dysplasia s/p LEEP   History of depression 03/16/2018   History of hyperlipidemia 03/16/2018   Hypercholesteremia 04/02/2016   Osteoarthritis    Primary osteoarthritis of both hands 04/08/2018   Sjogren's syndrome with keratoconjunctivitis sicca (HCC) 04/08/2018   +ANA, +Ro, +La, sicca symptoms, fatigue   Sleep apnea    Trochanteric bursitis, right hip 01/09/2018   Family History  Problem Relation Age of Onset   Lupus Mother    Osteoarthritis Mother    Polymyalgia rheumatica Mother    Osteoarthritis Father    Alzheimer's disease Father    Breast cancer Maternal Grandmother    Colon cancer Neg Hx    Esophageal cancer Neg Hx    Rectal cancer Neg Hx    Stomach cancer Neg Hx    Past Surgical History:  Procedure Laterality Date   CATARACT EXTRACTION, BILATERAL  07/2022   HYSTEROTOMY     KNEE SURGERY Right    LYMPH NODE BIOPSY     groin    NASAL SEPTUM SURGERY  12/2020   Social History   Occupational History   Not on file  Tobacco Use   Smoking status: Former    Current packs/day: 0.00    Average packs/day: 0.5 packs/day for 30.0 years (15.0 ttl pk-yrs)    Types: Cigarettes    Start date: 09/02/1980    Quit date: 09/02/2010    Years since quitting: 13.2    Passive exposure: Never   Smokeless tobacco: Never  Vaping Use   Vaping status: Never Used  Substance and Sexual Activity   Alcohol use: No    Drug use: Never   Sexual activity: Not on file

## 2023-12-12 NOTE — Progress Notes (Signed)
 Patient says that she did get some relief from the injection, but is not feeling 100%. She says that the front of her hip will still bother her some when lifting her leg to get into the car, or rolling over in bed. She is having some pain over the lateral hip that will wake her up at night. She says she is still interested in doing some physical therapy if that would be helpful for her.

## 2024-01-02 ENCOUNTER — Other Ambulatory Visit: Payer: Self-pay

## 2024-01-02 ENCOUNTER — Encounter: Payer: Self-pay | Admitting: Physical Therapy

## 2024-01-02 ENCOUNTER — Ambulatory Visit: Admitting: Physical Therapy

## 2024-01-02 DIAGNOSIS — M25551 Pain in right hip: Secondary | ICD-10-CM | POA: Diagnosis not present

## 2024-01-02 DIAGNOSIS — R262 Difficulty in walking, not elsewhere classified: Secondary | ICD-10-CM | POA: Diagnosis not present

## 2024-01-02 DIAGNOSIS — M6281 Muscle weakness (generalized): Secondary | ICD-10-CM | POA: Diagnosis not present

## 2024-01-02 NOTE — Therapy (Signed)
 OUTPATIENT PHYSICAL THERAPY LOWER EXTREMITY EVALUATION   Patient Name: SASCHA DISMUKES MRN: [id number] DOB:[birth year], 73 y.o., female Today's Date: 01/02/2024  END OF SESSION:  PT End of Session - 01/02/24 1430     Visit Number 1    Number of Visits 17    Date for PT Re-Evaluation 02/27/24    Authorization Type HTA    Authorization Time Period 01/02/24 to 02/27/24    Progress Note Due on Visit 10    PT Start Time 1347    PT Stop Time 1425    PT Time Calculation (min) 38 min    Activity Tolerance Patient tolerated treatment well    Behavior During Therapy [facility] for tasks assessed/performed             Past Medical History:  Diagnosis Date   Allergic rhinitis    Allergy    Anxiety 04/02/2016   Cataract    Chest pressure 05/11/2018   Depression    Dyslipidemia 04/08/2018   Exertional shortness of breath 05/11/2018   Family history of systemic lupus erythematosus (SLE) in mother 04/08/2018   Fibromyalgia    Former smoker 04/08/2018   GERD (gastroesophageal reflux disease)    History of cervical dysplasia 04/02/2016   2013; hysterectomy d/t recurrent dysplasia s/p LEEP   History of depression 03/16/2018   History of hyperlipidemia 03/16/2018   Hypercholesteremia 04/02/2016   Osteoarthritis    Primary osteoarthritis of both hands 04/08/2018   Sjogren's syndrome with keratoconjunctivitis sicca (HCC) 04/08/2018   +ANA, +Ro, +La, sicca symptoms, fatigue   Sleep apnea    Trochanteric bursitis, right hip 01/09/2018   Past Surgical History:  Procedure Laterality Date   CATARACT EXTRACTION, BILATERAL  07/2022   HYSTEROTOMY     KNEE SURGERY Right    LYMPH NODE BIOPSY     groin    NASAL SEPTUM SURGERY  12/2020   Patient Active Problem List   Diagnosis Date Noted   Unilateral primary osteoarthritis, left knee 05/23/2022   Acute lateral meniscus tear of left knee 05/07/2022   Pain in right hip 03/28/2022   Trochanteric bursitis, right hip 03/28/2022   CAD in native  artery 06/26/2018   Sleep apnea 05/15/2018   Angina pectoris (HCC) 05/15/2018   SOB (shortness of breath) on exertion 05/11/2018   Chest pressure 05/11/2018   Fibromyalgia 05/11/2018   Sjogren's syndrome with keratoconjunctivitis sicca (HCC) 04/08/2018   Former smoker 04/08/2018   Family history of systemic lupus erythematosus (SLE) in mother 04/08/2018   Dyslipidemia 04/08/2018   Primary osteoarthritis of both hands 04/08/2018   History of depression 03/16/2018   History of hyperlipidemia 03/16/2018   Anxiety 04/02/2016   History of cervical dysplasia 04/02/2016   Hyperlipidemia 04/02/2016    PCP: Rex Castor, MD   REFERRING PROVIDER: Shauna Del, DO  REFERRING DIAG:  Diagnosis  M16.11 (ICD-10-CM) - Unilateral primary osteoarthritis, right hip  S76.011D (ICD-10-CM) - Tear of right gluteus minimus tendon, subsequent encounter  R29.898 (ICD-10-CM) - Weakness of right hip    THERAPY DIAG:  Pain in right hip  Difficulty in walking, not elsewhere classified  Muscle weakness (generalized)  Rationale for Evaluation and Treatment: Rehabilitation  ONSET DATE: "several years ago"   SUBJECTIVE:   SUBJECTIVE STATEMENT:  This problem started out of nowhere. I do have OA, Dr. Vaughn Georges said that I have some partially torn tendons on the outside. I limp when I walk, its also very painful when I first get up, getting into the car can  be very painful, sleeping on right side can be difficult. This was first diagnosed as bursitis years ago, but recently they found the torn tendon on the MRI. After an injection I didn't have much pain at all, then started getting some groin pain after that. Limiting activity makes it not hurt, but then when I move again its worse, its a catch 22.  PERTINENT HISTORY: See above  PAIN:  Are you having pain? Yes: NPRS scale: 2/10 now, can get to 8/10 Pain location: R hip  Pain description: minimal now, aching but when bad can be very sharp stab   Aggravating factors: sitting, getting in/out of car, sleeping on right side  Relieving factors: extra strength tylenol, limiting activity   PRECAUTIONS: None  RED FLAGS: None   WEIGHT BEARING RESTRICTIONS: No  FALLS:  Has patient fallen in last 6 months? 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 Vaughn Georges on the 20th  OBJECTIVE:  Note: Objective measures were completed at Evaluation unless otherwise noted.  DIAGNOSTIC FINDINGS:   2 views of the right hip including AP pelvis and right hip lateral film  were ordered and reviewed by myself today.  X-rays demonstrate MR head  well-seated within the lung.  The superior lateral aspect of the joint is  relatively well-preserved but there is advanced arthritic change with near  bone-on-bone change over the inferior medial aspect of the joint with bony  sclerosis of the inferior acetabulum.  PATIENT SURVEYS:    Patient-Specific Activity Scoring Scheme  "0" represents "unable to perform." "10" represents "able to perform at prior level. 0 1 2 3 4 5 6 7 8 9  10 (Date and Score)   Activity Eval     1. Getting in car   5    2. Pain after sitting   6    3. Pain after standing for 15 minutes  5   4. Laying on right side  5   5.    Score 5.5    Total score = sum of the activity scores/number of activities Minimum detectable change (90%CI) for average score = 2 points Minimum detectable change (90%CI) for single activity score = 3 points     COGNITION: Overall cognitive status: Within functional limits for tasks assessed     SENSATION: Not tested    MUSCLE  LENGTH:  HS mod limitation B Piriformis mild limitation B     PALPATION:  Tender lateral R thigh, sore R glutes and piriformis     LOWER EXTREMITY MMT:  MMT Right eval Left eval  Hip flexion 3 3+  Hip extension    Hip abduction 3- pain  3  Hip adduction    Hip internal rotation    Hip external rotation    Knee flexion 4- 4  Knee extension 4- 4  Ankle dorsiflexion 5 5  Ankle plantarflexion    Ankle inversion    Ankle eversion     (Blank rows = not tested)  LOWER EXTREMITY SPECIAL TESTS:  Hip special tests: Portia Brittle (FABER) test: positive  and FADIR (+) in context of known advanced bone on bone in hip joint  GAIT: Distance walked: in clinic distances  Assistive device utilized: None Level of assistance: Complete Independence Comments: antalgic                                                                                                                                 TREATMENT DATE:   01/02/24  Exam, care planning, HEP  HEP practice/instruction as below  Ionto patch to lateral R hip, education given on 4 hour wear time and factors that would indicate early removal of patch       PATIENT EDUCATION:  Education details: exam findings, POC, HEP, ionto wear time/precautions  Person educated: Patient Education method: Explanation, Demonstration, and Handouts Education comprehension: verbalized understanding, returned demonstration, and needs further education  HOME EXERCISE PROGRAM: Access Code: [email or web address] URL: [email or web address] Date: 01/02/2024 Prepared by: Terrel Ferries  Exercises - Supine Bridge  - 1 x daily - 7 x weekly - 1-2 sets - 10 reps - 2 seconds  hold - Supine Active Straight Leg Raise  - 1 x daily - 7 x weekly - 1-2 sets - 10 reps - Seated Knee Extension with Anchored Resistance  - 1 x daily - 7 x weekly - 1-2 sets - 10 reps - 1 second  hold - Seated Knee Flexion with Anchored Resistance  - 1 x daily - 7 x weekly - 1-2  sets - 10 reps - 1 second  hold  ASSESSMENT:  CLINICAL IMPRESSION: Patient is a 73 y.o. F who was seen today for physical therapy evaluation and treatment for  Diagnosis  M16.11 (ICD-10-CM) - Unilateral primary osteoarthritis, right hip  S76.011D (ICD-10-CM) - Tear of right gluteus minimus tendon, subsequent encounter  R29.898 (ICD-10-CM) - Weakness of right hip  . Objectives as above, she does seem to have been compensating for this injury for quite some time. We also tried some ionto to lateral R hip today to help reduce inflammation and improve exercise tolerance. Will make every effort to address subjective concerns and reduce pain/improve function moving forward.   OBJECTIVE IMPAIRMENTS: Abnormal gait, decreased mobility, difficulty walking, decreased ROM, decreased strength, increased fascial restrictions, increased muscle spasms, obesity, and pain.   ACTIVITY LIMITATIONS: sitting, standing, sleeping, stairs, transfers, and locomotion level  PARTICIPATION LIMITATIONS: meal prep, cleaning, laundry, driving, shopping, community activity, and yard work  PERSONAL FACTORS: Age, Behavior pattern, Education, Fitness, Past/current experiences, Social background, and Time since onset of injury/illness/exacerbation are also affecting patient's functional outcome.   REHAB POTENTIAL: Fair chronicity of pain/injury, sedentary lifestyle, body habitus, severe bone on bone R hip joint   CLINICAL DECISION MAKING: Evolving/moderate complexity  EVALUATION COMPLEXITY: Moderate   GOALS: Goals reviewed with patient? No  SHORT TERM GOALS: Target date: 01/30/2024   Will be compliant with appropriate progressive HEP  Baseline: Goal status: INITIAL  2.  Pain to be no more than 6/10 at worst R hip  Baseline:  Goal status: INITIAL  3.  Gait pattern to  have normalized  Baseline:  Goal status: INITIAL  4.  General LE flexibility to be no more than 25% limited  Baseline:  Goal status:  INITIAL    LONG TERM GOALS: Target date: 02/27/2024    MMT to have improved by 1 grade all weak groups  Baseline:  Goal status: INITIAL  2.  Pain in R hip to be no more than 3/10 at worst with all functional tasks including laying on her R side and getting in/out of the car  Baseline:  Goal status: INITIAL  3.  Pain with sitting to have resolved  Baseline:  Goal status: INITIAL  4.  Will be able to stand/walk as desired for home care tasks and exercise as desired with R hip pain no more than 3/10 Baseline:  Goal status: INITIAL  5.  PSFS to have improved by at least 2 points  Baseline:  Goal status: INITIAL     PLAN:  PT FREQUENCY: 2x/week  PT DURATION: 8 weeks  PLANNED INTERVENTIONS: 97750- Physical Performance Testing, 97110-Therapeutic exercises, 97530- Therapeutic activity, [id number]- Neuromuscular re-education, 97535- Self Care, 04540- Manual therapy, [id number]- Gait training, [id number]- Aquatic Therapy, [phone]- Electrical stimulation (unattended), 97016- Vasopneumatic device, [id number]- Ultrasound, [id number]- Ionotophoresis 4mg /ml Dexamethasone , Taping, and Dry Needling  PLAN FOR NEXT SESSION: continue ionto if it was helpful, otherwise cautious strengthening in context of glute tendon partial tear   Terrel Ferries, PT, DPT 01/02/24 2:40 PM

## 2024-01-07 ENCOUNTER — Other Ambulatory Visit: Payer: Self-pay | Admitting: Rheumatology

## 2024-01-07 NOTE — Telephone Encounter (Signed)
 Last Fill: 07/02/2023  Eye exam: 03/19/2023  WNL   Labs: 11/25/2023 WBC count is low but improved.  CMP is normal.  Urine protein creatinine ratio normal.  Sedimentation rate normal complements normal double-stranded ENA is pending.   Next Visit: 04/26/2024  Last Visit: 11/25/2023  ZO:[id number] syndrome with keratoconjunctivitis sicca   Current Dose per office note 11/25/2023: Plaquenil  200 mg 1 tablet by mouth twice daily monday through friday   Okay to refill Plaquenil ?

## 2024-01-09 ENCOUNTER — Encounter: Admitting: Physical Therapy

## 2024-01-20 ENCOUNTER — Encounter: Admitting: Physical Therapy

## 2024-01-22 ENCOUNTER — Encounter: Admitting: Rehabilitative and Restorative Service Providers"

## 2024-01-22 ENCOUNTER — Ambulatory Visit: Admitting: Sports Medicine

## 2024-01-30 ENCOUNTER — Encounter: Payer: Self-pay | Admitting: Physical Therapy

## 2024-01-30 ENCOUNTER — Ambulatory Visit: Admitting: Physical Therapy

## 2024-01-30 DIAGNOSIS — M6281 Muscle weakness (generalized): Secondary | ICD-10-CM

## 2024-01-30 DIAGNOSIS — M25551 Pain in right hip: Secondary | ICD-10-CM

## 2024-01-30 DIAGNOSIS — R262 Difficulty in walking, not elsewhere classified: Secondary | ICD-10-CM

## 2024-01-30 NOTE — Therapy (Signed)
 OUTPATIENT PHYSICAL THERAPY LOWER EXTREMITY TREATMENT   Patient Name: TALLIE DODDS MRN: [id number] DOB:[birth year], 73 y.o., female Today's Date: 01/30/2024  END OF SESSION:  PT End of Session - 01/30/24 1405     Visit Number 2    Number of Visits 17    Date for PT Re-Evaluation 02/27/24    Authorization Type HTA    Authorization Time Period 01/02/24 to 02/27/24    Progress Note Due on Visit 10    PT Start Time 1352   pt few minutes late   PT Stop Time 1430    PT Time Calculation (min) 38 min    Activity Tolerance Patient tolerated treatment well    Behavior During Therapy [facility] for tasks assessed/performed              Past Medical History:  Diagnosis Date   Allergic rhinitis    Allergy    Anxiety 04/02/2016   Cataract    Chest pressure 05/11/2018   Depression    Dyslipidemia 04/08/2018   Exertional shortness of breath 05/11/2018   Family history of systemic lupus erythematosus (SLE) in mother 04/08/2018   Fibromyalgia    Former smoker 04/08/2018   GERD (gastroesophageal reflux disease)    History of cervical dysplasia 04/02/2016   2013; hysterectomy d/t recurrent dysplasia s/p LEEP   History of depression 03/16/2018   History of hyperlipidemia 03/16/2018   Hypercholesteremia 04/02/2016   Osteoarthritis    Primary osteoarthritis of both hands 04/08/2018   Sjogren's syndrome with keratoconjunctivitis sicca (HCC) 04/08/2018   +ANA, +Ro, +La, sicca symptoms, fatigue   Sleep apnea    Trochanteric bursitis, right hip 01/09/2018   Past Surgical History:  Procedure Laterality Date   CATARACT EXTRACTION, BILATERAL  07/2022   HYSTEROTOMY     KNEE SURGERY Right    LYMPH NODE BIOPSY     groin    NASAL SEPTUM SURGERY  12/2020   Patient Active Problem List   Diagnosis Date Noted   Unilateral primary osteoarthritis, left knee 05/23/2022   Acute lateral meniscus tear of left knee 05/07/2022   Pain in right hip 03/28/2022   Trochanteric bursitis, right hip  03/28/2022   CAD in native artery 06/26/2018   Sleep apnea 05/15/2018   Angina pectoris (HCC) 05/15/2018   SOB (shortness of breath) on exertion 05/11/2018   Chest pressure 05/11/2018   Fibromyalgia 05/11/2018   Sjogren's syndrome with keratoconjunctivitis sicca (HCC) 04/08/2018   Former smoker 04/08/2018   Family history of systemic lupus erythematosus (SLE) in mother 04/08/2018   Dyslipidemia 04/08/2018   Primary osteoarthritis of both hands 04/08/2018   History of depression 03/16/2018   History of hyperlipidemia 03/16/2018   Anxiety 04/02/2016   History of cervical dysplasia 04/02/2016   Hyperlipidemia 04/02/2016    PCP: Rex Castor, MD   REFERRING PROVIDER: Shauna Del, DO  REFERRING DIAG:  Diagnosis  M16.11 (ICD-10-CM) - Unilateral primary osteoarthritis, right hip  S76.011D (ICD-10-CM) - Tear of right gluteus minimus tendon, subsequent encounter  R29.898 (ICD-10-CM) - Weakness of right hip    THERAPY DIAG:  Pain in right hip  Difficulty in walking, not elsewhere classified  Muscle weakness (generalized)  Rationale for Evaluation and Treatment: Rehabilitation  ONSET DATE: "several years ago"   SUBJECTIVE:   SUBJECTIVE STATEMENT:  I wasn't able to get any appointments for 3 weeks after that eval and then had to be out of town unexpectedly after that. Then was [place] the beach and think I got out of [place]  chairs too much and hip bothered me more. Was doing OK with HEP but struggled this week due to pain.      EVAL: This problem started out of nowhere. I do have OA, Dr. Vaughn Georges said that I have some partially torn tendons on the outside. I limp when I walk, its also very painful when I first get up, getting into the car can be very painful, sleeping on right side can be difficult. This was first diagnosed as bursitis years ago, but recently they found the torn tendon on the MRI. After an injection I didn't have much pain at all, then started getting some groin  pain after that. Limiting activity makes it not hurt, but then when I move again its worse, its a catch 22.  PERTINENT HISTORY: See above  PAIN:  Are you having pain? Yes: NPRS scale: 5/10 Pain location: R hip  Pain description: ache and sometimes stab with transfers Aggravating factors: sitting, getting in/out of car, sleeping on right side, low chairs   Relieving factors: extra strength tylenol, limiting activity   PRECAUTIONS: None  RED FLAGS: None   WEIGHT BEARING RESTRICTIONS: No  FALLS:  Has patient fallen in last 6 months? 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 Vaughn Georges on the 20th  OBJECTIVE:  Note: Objective measures were completed at Evaluation unless otherwise noted.  DIAGNOSTIC FINDINGS:   2 views of the right hip including AP pelvis and right hip lateral film  were ordered and reviewed by myself today.  X-rays demonstrate MR head  well-seated within the lung.  The superior lateral aspect of the joint is  relatively well-preserved but there is advanced arthritic change with near  bone-on-bone change over the inferior medial aspect of the joint with bony  sclerosis of the inferior acetabulum.  PATIENT SURVEYS:    Patient-Specific Activity Scoring Scheme  "0" represents "unable to perform." "10" represents "able to perform at prior level. 0 1 2 3 4 5 6 7 8 9  10 (Date and Score)   Activity Eval   01/30/24  1. Getting in car   5 5   2. Pain after sitting   6 5  3. Pain after standing for 15 minutes  5 7  4. Laying on right side  5 6  5.    Score 5.5 5.75 (adjusted scoring PRN as pt gave some  answers on NPRS instead of difficulty scale)   Total score = sum of the activity scores/number of activities Minimum detectable change (90%CI) for average score = 2 points Minimum detectable change (90%CI) for single activity score = 3 points     COGNITION: Overall cognitive status: Within functional limits for tasks assessed     SENSATION: Not tested    MUSCLE LENGTH:  HS mod limitation B Piriformis mild limitation B     PALPATION:  Tender lateral R thigh, sore R glutes and piriformis     LOWER EXTREMITY MMT:  MMT Right eval Left eval Right 01/30/24 Left 01/30/24  Hip flexion 3 3+ 4 4  Hip extension      Hip abduction 3- pain  3 3- pain 3  Hip adduction      Hip internal rotation      Hip external rotation      Knee flexion 4- 4 4- 4-  Knee extension 4- 4 4 4   Ankle dorsiflexion 5 5    Ankle plantarflexion      Ankle inversion      Ankle eversion       (Blank rows = not tested)  LOWER EXTREMITY SPECIAL TESTS:  Hip special tests: Portia Brittle (FABER) test: positive  and FADIR (+) in context of known advanced bone on bone in hip joint     GAIT: Distance walked: in clinic distances  Assistive device utilized: None Level of assistance: Complete Independence Comments: antalgic                                                                                                                                 TREATMENT DATE:   01/30/24  Nustep L3 seat 5 all four extremities x8 minutes  MMT and PSFS   Bridges x12 Isometric hooklying clam x10 B (attempted supine clams but too painful) HS stretches 2x30 seconds B Cautious piriformis stretches 3x10 seconds R, 2x30 seconds L   Ionto patch #2 4 hours/1.11mL dex lateral R hip       01/02/24  Exam, care planning, HEP  HEP practice/instruction as below  Ionto patch to lateral R hip, education given on 4 hour wear time and factors that would indicate early removal of patch       PATIENT EDUCATION:   Education details: exam findings, POC, HEP, ionto wear time/precautions  Person educated: Patient Education method: Explanation, Demonstration, and Handouts Education comprehension: verbalized understanding, returned demonstration, and needs further education  HOME EXERCISE PROGRAM: Access Code: [email or web address] URL: [email or web address] Date: 01/02/2024 Prepared by: Terrel Ferries  Exercises - Supine Bridge  - 1 x daily - 7 x weekly - 1-2 sets - 10 reps - 2 seconds  hold - Supine Active Straight Leg Raise  - 1 x daily - 7 x weekly - 1-2 sets - 10 reps - Seated Knee Extension with Anchored Resistance  - 1 x daily - 7 x weekly - 1-2 sets - 10 reps - 1 second  hold - Seated Knee Flexion with Anchored Resistance  - 1 x daily - 7 x weekly - 1-2 sets - 10 reps - 1 second  hold  ASSESSMENT:  CLINICAL IMPRESSION:  Pt arrives today after being out of PT/unseen by us  for about a month- looks like per Epic,  she did have some cancels early in May then was out of town unexpectedly. Updated PSFS, MMT, and goals as able today. Otherwise worked on functional strength as time allowed and applied ionto patch again at [employer] as this was helpful last time. Will continue to progress as appropriate/tolerated.     EVAL: Patient is a 73 y.o. F who was seen today for physical therapy evaluation and treatment for  Diagnosis  M16.11 (ICD-10-CM) - Unilateral primary osteoarthritis, right hip  S76.011D (ICD-10-CM) - Tear of right gluteus minimus tendon, subsequent encounter  R29.898 (ICD-10-CM) - Weakness of right hip  . Objectives as above, she does seem to have been compensating for this injury for quite some time. We also tried some ionto to lateral R hip today to help reduce inflammation and improve exercise tolerance. Will make every effort to address subjective concerns and reduce pain/improve function moving forward.   OBJECTIVE IMPAIRMENTS: Abnormal gait, decreased mobility, difficulty walking,  decreased ROM, decreased strength, increased fascial restrictions, increased muscle spasms, obesity, and pain.   ACTIVITY LIMITATIONS: sitting, standing, sleeping, stairs, transfers, and locomotion level  PARTICIPATION LIMITATIONS: meal prep, cleaning, laundry, driving, shopping, community activity, and yard work  PERSONAL FACTORS: Age, Behavior pattern, Education, Fitness, Past/current experiences, Social background, and Time since onset of injury/illness/exacerbation are also affecting patient's functional outcome.   REHAB POTENTIAL: Fair chronicity of pain/injury, sedentary lifestyle, body habitus, severe bone on bone R hip joint   CLINICAL DECISION MAKING: Evolving/moderate complexity  EVALUATION COMPLEXITY: Moderate   GOALS: Goals reviewed with patient? No  SHORT TERM GOALS: Target date: 01/30/2024   Will be compliant with appropriate progressive HEP  Baseline: Goal status: ONGOING 01/30/24  2.  Pain to be no more than 6/10 at worst R hip  Baseline:  Goal status: ONGOING 01/30/24 getting to 8/10  3.  Gait pattern to have normalized  Baseline:  Goal status: ONGOING 01/30/24  4.  General LE flexibility to be no more than 25% limited  Baseline:  Goal status: ONGOING 01/30/24    LONG TERM GOALS: Target date: 02/27/2024    MMT to have improved by 1 grade all weak groups  Baseline:  Goal status: ONGOING 01/30/24  2.  Pain in R hip to be no more than 3/10 at worst with all functional tasks including laying on her R side and getting in/out of the car  Baseline:  Goal status: INITIAL  3.  Pain with sitting to have resolved  Baseline:  Goal status: INITIAL  4.  Will be able to stand/walk as desired for home care tasks and exercise as desired with R hip pain no more than 3/10 Baseline:  Goal status: INITIAL  5.  PSFS to have improved by at least 2 points  Baseline:  Goal status: ONGOING 01/30/24     PLAN:  PT FREQUENCY: 2x/week  PT DURATION: 8 weeks  PLANNED  INTERVENTIONS: 97750- Physical Performance Testing, 97110-Therapeutic exercises, 97530- Therapeutic activity, 97112- Neuromuscular re-education, 97535- Self Care, 40981- Manual therapy, [id number]- Gait training, [phone]- Aquatic Therapy, [phone]- Electrical stimulation (unattended), 97016- Vasopneumatic device, [id number]- Ultrasound, [id number]- Ionotophoresis 4mg /ml Dexamethasone , Taping, and Dry Needling  PLAN FOR NEXT SESSION: continue ionto (did patch 2 today), otherwise cautious strengthening in context of glute tendon partial tear   Terrel Ferries, PT, DPT 01/30/24 2:31 PM

## 2024-02-03 ENCOUNTER — Ambulatory Visit: Admitting: Physical Therapy

## 2024-02-03 ENCOUNTER — Encounter: Payer: Self-pay | Admitting: Physical Therapy

## 2024-02-03 DIAGNOSIS — M6281 Muscle weakness (generalized): Secondary | ICD-10-CM | POA: Diagnosis not present

## 2024-02-03 DIAGNOSIS — M25551 Pain in right hip: Secondary | ICD-10-CM | POA: Diagnosis not present

## 2024-02-03 DIAGNOSIS — R262 Difficulty in walking, not elsewhere classified: Secondary | ICD-10-CM

## 2024-02-03 NOTE — Therapy (Signed)
 OUTPATIENT PHYSICAL THERAPY LOWER EXTREMITY TREATMENT   Patient Name: Kaitlyn Hudson MRN: [id number] DOB:Aug 19, 1951, 73 y.o., female Today's Date: 02/03/2024  END OF SESSION:  PT End of Session - 02/03/24 1423     Visit Number 3    Number of Visits 17    Date for PT Re-Evaluation 02/27/24    Authorization Type HTA    Authorization Time Period 01/02/24 to 02/27/24    Progress Note Due on Visit 10    PT Start Time 1345    PT Stop Time 1423    PT Time Calculation (min) 38 min    Activity Tolerance Patient tolerated treatment well    Behavior During Therapy [facility] for tasks assessed/performed               Past Medical History:  Diagnosis Date   Allergic rhinitis    Allergy    Anxiety 04/02/2016   Cataract    Chest pressure 05/11/2018   Depression    Dyslipidemia 04/08/2018   Exertional shortness of breath 05/11/2018   Family history of systemic lupus erythematosus (SLE) in mother 04/08/2018   Fibromyalgia    Former smoker 04/08/2018   GERD (gastroesophageal reflux disease)    History of cervical dysplasia 04/02/2016   2013; hysterectomy d/t recurrent dysplasia s/p LEEP   History of depression 03/16/2018   History of hyperlipidemia 03/16/2018   Hypercholesteremia 04/02/2016   Osteoarthritis    Primary osteoarthritis of both hands 04/08/2018   Sjogren's syndrome with keratoconjunctivitis sicca (HCC) 04/08/2018   +ANA, +Ro, +La, sicca symptoms, fatigue   Sleep apnea    Trochanteric bursitis, right hip 01/09/2018   Past Surgical History:  Procedure Laterality Date   CATARACT EXTRACTION, BILATERAL  07/2022   HYSTEROTOMY     KNEE SURGERY Right    LYMPH NODE BIOPSY     groin    NASAL SEPTUM SURGERY  12/2020   Patient Active Problem List   Diagnosis Date Noted   Unilateral primary osteoarthritis, left knee 05/23/2022   Acute lateral meniscus tear of left knee 05/07/2022   Pain in right hip 03/28/2022   Trochanteric bursitis, right hip 03/28/2022   CAD in native  artery 06/26/2018   Sleep apnea 05/15/2018   Angina pectoris (HCC) 05/15/2018   SOB (shortness of breath) on exertion 05/11/2018   Chest pressure 05/11/2018   Fibromyalgia 05/11/2018   Sjogren's syndrome with keratoconjunctivitis sicca (HCC) 04/08/2018   Former smoker 04/08/2018   Family history of systemic lupus erythematosus (SLE) in mother 04/08/2018   Dyslipidemia 04/08/2018   Primary osteoarthritis of both hands 04/08/2018   History of depression 03/16/2018   History of hyperlipidemia 03/16/2018   Anxiety 04/02/2016   History of cervical dysplasia 04/02/2016   Hyperlipidemia 04/02/2016    PCP: Rex Castor, MD   REFERRING PROVIDER: Shauna Del, DO  REFERRING DIAG:  Diagnosis  M16.11 (ICD-10-CM) - Unilateral primary osteoarthritis, right hip  S76.011D (ICD-10-CM) - Tear of right gluteus minimus tendon, subsequent encounter  R29.898 (ICD-10-CM) - Weakness of right hip    THERAPY DIAG:  Pain in right hip  Difficulty in walking, not elsewhere classified  Muscle weakness (generalized)  Rationale for Evaluation and Treatment: Rehabilitation  ONSET DATE: "several years ago"   SUBJECTIVE:   SUBJECTIVE STATEMENT: Pt arriving today reporting 3/10 Rt lateral hip pain and down Rt lateral thigh.    EVAL: This problem started out of nowhere. I do have OA, Dr. Vaughn Georges said that I have some partially torn tendons on the outside.  I limp when I walk, its also very painful when I first get up, getting into the car can be very painful, sleeping on right side can be difficult. This was first diagnosed as bursitis years ago, but recently they found the torn tendon on the MRI. After an injection I didn't have much pain at all, then started getting some groin pain after that. Limiting activity makes it not hurt, but then when I move again its worse, its a catch 22.  PERTINENT HISTORY: See above  PAIN:  Are you having pain? Yes: NPRS scale: 3/10 Pain location: R hip  Pain  description: ache and sometimes stab with transfers Aggravating factors: sitting, getting in/out of car, sleeping on right side, low chairs   Relieving factors: extra strength tylenol, limiting activity   PRECAUTIONS: None  RED FLAGS: None   WEIGHT BEARING RESTRICTIONS: No  FALLS:  Has patient fallen in last 6 months? 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 Vaughn Georges on the 20th  OBJECTIVE:  Note: Objective measures were completed at Evaluation unless otherwise noted.  DIAGNOSTIC FINDINGS:   2 views of the right hip including AP pelvis and right hip lateral film  were ordered and reviewed by myself today.  X-rays demonstrate MR head  well-seated within the lung.  The superior lateral aspect of the joint is  relatively well-preserved but there is advanced arthritic change with near  bone-on-bone change over the inferior medial aspect of the joint with bony  sclerosis of the inferior acetabulum.  PATIENT SURVEYS:    Patient-Specific Activity Scoring Scheme  "0" represents "unable to perform." "10" represents "able to perform at prior level. 0 1 2 3 4 5 6 7 8 9  10 (Date and Score)   Activity Eval   01/30/24  1. Getting in car   5 5   2. Pain after sitting   6 5  3. Pain after standing for 15 minutes  5 7  4. Laying on right side  5 6  5.    Score 5.5 5.75 (adjusted scoring PRN as pt gave some answers on NPRS instead of difficulty scale)   Total score = sum of the activity scores/number of activities Minimum detectable change (90%CI) for average score = 2 points Minimum detectable change (90%CI) for single  activity score = 3 points     COGNITION: Overall cognitive status: Within functional limits for tasks assessed     SENSATION: Not tested    MUSCLE LENGTH:  HS mod limitation B Piriformis mild limitation B     PALPATION:  Tender lateral R thigh, sore R glutes and piriformis     LOWER EXTREMITY MMT:  MMT Right eval Left eval Right 01/30/24 Left 01/30/24  Hip flexion 3 3+ 4 4  Hip extension      Hip abduction 3- pain  3 3- pain 3  Hip adduction      Hip internal rotation      Hip external rotation      Knee flexion 4- 4 4- 4-  Knee extension 4- 4 4 4   Ankle dorsiflexion 5 5    Ankle plantarflexion  Ankle inversion      Ankle eversion       (Blank rows = not tested)  LOWER EXTREMITY SPECIAL TESTS:  Hip special tests: Portia Brittle (FABER) test: positive  and FADIR (+) in context of known advanced bone on bone in hip joint     GAIT: Distance walked: in clinic distances  Assistive device utilized: None Level of assistance: Complete Independence Comments: antalgic                                                                                                                                 TREATMENT DATE:  02/03/24:  TherEx Nustep: level 6 x 6 minutes Standing hip hiking x 15 bil c UE support Standing hip abd x 10 bil  Side lying: reverse clams x 12 Supine glute stretch pushing knee away, pt has trouble tolerating Manual Percussion to Rt lateral hip, IT band, glute medius ISATM to Rt glutes, Rt IT band, Rt hamstrings and Rt lateral quad, Rt TFL Self Care:  STM using tennis ball, pt able to return demonstration TherAct Step ups x 10 4 inch step Standing hip extension x 10 bil c UE support       01/30/24 Nustep L3 seat 5 all four extremities x8 minutes  MMT and PSFS   Bridges x12 Isometric hooklying clam x10 B (attempted supine clams but too painful) HS stretches 2x30 seconds B Cautious piriformis stretches 3x10 seconds R, 2x30 seconds L    Ionto patch #2 4 hours/1.25mL dex lateral R hip       01/02/24  Exam, care planning, HEP  HEP practice/instruction as below  Ionto patch to lateral R hip, education given on 4 hour wear time and factors that would indicate early removal of patch       PATIENT EDUCATION:  Education details: exam findings, POC, HEP, ionto wear time/precautions  Person educated: Patient Education method: Explanation, Demonstration, and Handouts Education comprehension: verbalized understanding, returned demonstration, and needs further education  HOME EXERCISE PROGRAM: Access Code: [email or web address] URL: [email or web address] Date: 01/02/2024 Prepared by: Terrel Ferries  Exercises - Supine Bridge  - 1 x daily - 7 x weekly - 1-2 sets - 10 reps - 2 seconds  hold - Supine Active Straight Leg Raise  - 1 x daily - 7 x weekly - 1-2 sets - 10 reps - Seated Knee Extension with Anchored Resistance  - 1 x daily - 7 x weekly - 1-2 sets - 10 reps - 1 second  hold - Seated Knee Flexion with Anchored Resistance  - 1 x daily - 7 x weekly - 1-2 sets - 10 reps - 1 second  hold  ASSESSMENT:  CLINICAL IMPRESSION: Pt arriving reporting no response from Ionto patch. Pt wishing to try STM this visit with good response. Pt still limited by pain with certain activities especially weight bearing. Recommending continued skilled PT interventions to maximize pt's function.     EVAL: Patient is  a 73 y.o. F who was seen today for physical therapy evaluation and treatment for  Diagnosis  M16.11 (ICD-10-CM) - Unilateral primary osteoarthritis, right hip  S76.011D (ICD-10-CM) - Tear of right gluteus minimus tendon, subsequent encounter  R29.898 (ICD-10-CM) - Weakness of right hip  . Objectives as above, she does seem to have been compensating for this injury for quite some time. We also tried some ionto to lateral R hip today to help reduce inflammation and improve exercise tolerance. Will make every effort to  address subjective concerns and reduce pain/improve function moving forward.   OBJECTIVE IMPAIRMENTS: Abnormal gait, decreased mobility, difficulty walking, decreased ROM, decreased strength, increased fascial restrictions, increased muscle spasms, obesity, and pain.   ACTIVITY LIMITATIONS: sitting, standing, sleeping, stairs, transfers, and locomotion level  PARTICIPATION LIMITATIONS: meal prep, cleaning, laundry, driving, shopping, community activity, and yard work  PERSONAL FACTORS: Age, Behavior pattern, Education, Fitness, Past/current experiences, Social background, and Time since onset of injury/illness/exacerbation are also affecting patient's functional outcome.   REHAB POTENTIAL: Fair chronicity of pain/injury, sedentary lifestyle, body habitus, severe bone on bone R hip joint   CLINICAL DECISION MAKING: Evolving/moderate complexity  EVALUATION COMPLEXITY: Moderate   GOALS: Goals reviewed with patient? No  SHORT TERM GOALS: Target date: 01/30/2024   Will be compliant with appropriate progressive HEP  Baseline: Goal status: ONGOING 01/30/24  2.  Pain to be no more than 6/10 at worst R hip  Baseline:  Goal status: ONGOING 01/30/24 getting to 8/10  3.  Gait pattern to have normalized  Baseline:  Goal status: ONGOING 01/30/24  4.  General LE flexibility to be no more than 25% limited  Baseline:  Goal status: ONGOING 01/30/24    LONG TERM GOALS: Target date: 02/27/2024    MMT to have improved by 1 grade all weak groups  Baseline:  Goal status: ONGOING 01/30/24  2.  Pain in R hip to be no more than 3/10 at worst with all functional tasks including laying on her R side and getting in/out of the car  Baseline:  Goal status: INITIAL  3.  Pain with sitting to have resolved  Baseline:  Goal status: INITIAL  4.  Will be able to stand/walk as desired for home care tasks and exercise as desired with R hip pain no more than 3/10 Baseline:  Goal status: INITIAL  5.  PSFS  to have improved by at least 2 points  Baseline:  Goal status: ONGOING 01/30/24     PLAN:  PT FREQUENCY: 2x/week  PT DURATION: 8 weeks  PLANNED INTERVENTIONS: 97750- Physical Performance Testing, 97110-Therapeutic exercises, 97530- Therapeutic activity, [id number]- Neuromuscular re-education, 97535- Self Care, [id number]- Manual therapy, [id number]- Gait training, [phone]- Aquatic Therapy, [phone]- Electrical stimulation (unattended), [id number]- Vasopneumatic device, [id number]- Ultrasound, [id number]- Ionotophoresis 4mg /ml Dexamethasone , Taping, and Dry Needling  PLAN FOR NEXT SESSION:  cautious strengthening in context of glute tendon partial tear,  How did STM go?    Jerrel Mor, PT, MPT 02/03/24 2:27 PM   02/03/24 2:27 PM

## 2024-02-06 ENCOUNTER — Ambulatory Visit: Admitting: Rehabilitative and Restorative Service Providers"

## 2024-02-06 ENCOUNTER — Encounter: Payer: Self-pay | Admitting: Rehabilitative and Restorative Service Providers"

## 2024-02-06 DIAGNOSIS — M25551 Pain in right hip: Secondary | ICD-10-CM

## 2024-02-06 DIAGNOSIS — M6281 Muscle weakness (generalized): Secondary | ICD-10-CM

## 2024-02-06 DIAGNOSIS — R262 Difficulty in walking, not elsewhere classified: Secondary | ICD-10-CM

## 2024-02-06 NOTE — Therapy (Signed)
 OUTPATIENT PHYSICAL THERAPY LOWER EXTREMITY TREATMENT   Patient Name: Kaitlyn Hudson MRN: [id number] DOB:Sep 12, 1950, 73 y.o., female Today's Date: 02/06/2024  END OF SESSION:  PT End of Session - 02/06/24 1341     Visit Number 4    Number of Visits 17    Date for PT Re-Evaluation 02/27/24    Authorization Type HTA    Authorization Time Period 01/02/24 to 02/27/24    Progress Note Due on Visit 10    PT Start Time 1340    PT Stop Time 1422    PT Time Calculation (min) 42 min    Activity Tolerance Patient tolerated treatment well;No increased pain;Patient limited by pain    Behavior During Therapy [facility] for tasks assessed/performed                Past Medical History:  Diagnosis Date   Allergic rhinitis    Allergy    Anxiety 04/02/2016   Cataract    Chest pressure 05/11/2018   Depression    Dyslipidemia 04/08/2018   Exertional shortness of breath 05/11/2018   Family history of systemic lupus erythematosus (SLE) in mother 04/08/2018   Fibromyalgia    Former smoker 04/08/2018   GERD (gastroesophageal reflux disease)    History of cervical dysplasia 04/02/2016   2013; hysterectomy d/t recurrent dysplasia s/p LEEP   History of depression 03/16/2018   History of hyperlipidemia 03/16/2018   Hypercholesteremia 04/02/2016   Osteoarthritis    Primary osteoarthritis of both hands 04/08/2018   Sjogren's syndrome with keratoconjunctivitis sicca (HCC) 04/08/2018   +ANA, +Ro, +La, sicca symptoms, fatigue   Sleep apnea    Trochanteric bursitis, right hip 01/09/2018   Past Surgical History:  Procedure Laterality Date   CATARACT EXTRACTION, BILATERAL  07/2022   HYSTEROTOMY     KNEE SURGERY Right    LYMPH NODE BIOPSY     groin    NASAL SEPTUM SURGERY  12/2020   Patient Active Problem List   Diagnosis Date Noted   Unilateral primary osteoarthritis, left knee 05/23/2022   Acute lateral meniscus tear of left knee 05/07/2022   Pain in right hip 03/28/2022   Trochanteric  bursitis, right hip 03/28/2022   CAD in native artery 06/26/2018   Sleep apnea 05/15/2018   Angina pectoris (HCC) 05/15/2018   SOB (shortness of breath) on exertion 05/11/2018   Chest pressure 05/11/2018   Fibromyalgia 05/11/2018   Sjogren's syndrome with keratoconjunctivitis sicca (HCC) 04/08/2018   Former smoker 04/08/2018   Family history of systemic lupus erythematosus (SLE) in mother 04/08/2018   Dyslipidemia 04/08/2018   Primary osteoarthritis of both hands 04/08/2018   History of depression 03/16/2018   History of hyperlipidemia 03/16/2018   Anxiety 04/02/2016   History of cervical dysplasia 04/02/2016   Hyperlipidemia 04/02/2016    PCP: Rex Castor, MD   REFERRING PROVIDER: Shauna Del, DO  REFERRING DIAG:  Diagnosis  M16.11 (ICD-10-CM) - Unilateral primary osteoarthritis, right hip  S76.011D (ICD-10-CM) - Tear of right gluteus minimus tendon, subsequent encounter  R29.898 (ICD-10-CM) - Weakness of right hip    THERAPY DIAG:  Pain in right hip  Difficulty in walking, not elsewhere classified  Muscle weakness (generalized)  Rationale for Evaluation and Treatment: Rehabilitation  ONSET DATE: "several years ago"   SUBJECTIVE:   SUBJECTIVE STATEMENT: [name] reports good HEP compliance.  She would like to get stroner to avoid or prepare for surgery.  EVAL: This problem started out of nowhere. I do have OA, Dr. Vaughn Georges said that I  have some partially torn tendons on the outside. I limp when I walk, its also very painful when I first get up, getting into the car can be very painful, sleeping on right side can be difficult. This was first diagnosed as bursitis years ago, but recently they found the torn tendon on the MRI. After an injection I didn't have much pain at all, then started getting some groin pain after that. Limiting activity makes it not hurt, but then when I move again its worse, its a catch 22.  PERTINENT HISTORY: See above  PAIN:  Are you having  pain? Yes: NPRS scale: 2-4/10 this week Pain location: Rt hip  Pain description: ache and sometimes stab with transfers Aggravating factors: sitting, getting in/out of car, sleeping on right side, low chairs   Relieving factors: extra strength tylenol, limiting activity   PRECAUTIONS: None  RED FLAGS: None   WEIGHT BEARING RESTRICTIONS: No  FALLS:  Has patient fallen in last 6 months? 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 Vaughn Georges on the 20th  OBJECTIVE:  Note: Objective measures were completed at Evaluation unless otherwise noted.  DIAGNOSTIC FINDINGS:   2 views of the right hip including AP pelvis and right hip lateral film  were ordered and reviewed by myself today.  X-rays demonstrate MR head  well-seated within the lung.  The superior lateral aspect of the joint is  relatively well-preserved but there is advanced arthritic change with near  bone-on-bone change over the inferior medial aspect of the joint with bony  sclerosis of the inferior acetabulum.  PATIENT SURVEYS:    Patient-Specific Activity Scoring Scheme  "0" represents "unable to perform." "10" represents "able to perform at prior level. 0 1 2 3 4 5 6 7 8 9  10 (Date and Score)   Activity Eval   01/30/24  1. Getting in car   5 5   2. Pain after sitting   6 5  3. Pain after standing for 15 minutes  5 7  4. Laying on right side  5 6  5.    Score 5.5 5.75 (adjusted scoring PRN as pt gave some answers on NPRS instead of difficulty scale)   Total score = sum of the activity scores/number of activities Minimum detectable change (90%CI) for  average score = 2 points Minimum detectable change (90%CI) for single activity score = 3 points     COGNITION: Overall cognitive status: Within functional limits for tasks assessed     SENSATION: Not tested    MUSCLE LENGTH:  HS mod limitation B Piriformis mild limitation B     PALPATION:  Tender lateral R thigh, sore R glutes and piriformis     LOWER EXTREMITY MMT:  MMT Right eval Left eval Right 01/30/24 Left 01/30/24  Hip flexion 3 3+ 4 4  Hip extension      Hip abduction 3- pain  3 3- pain 3  Hip adduction      Hip internal rotation      Hip external rotation      Knee flexion 4- 4 4- 4-  Knee extension 4- 4 4 4   Ankle dorsiflexion  5 5    Ankle plantarflexion      Ankle inversion      Ankle eversion       (Blank rows = not tested)  LOWER EXTREMITY SPECIAL TESTS:  Hip special tests: Portia Brittle (FABER) test: positive  and FADIR (+) in context of known advanced bone on bone in hip joint     GAIT: Distance walked: in clinic distances  Assistive device utilized: None Level of assistance: Complete Independence Comments: antalgic                                                                                                                                 TREATMENT DATE:  02/06/2024 Nu Step Level 6 for 6 minutes Side lie clams 2 sets of 10 for 3 seconds  Neuromuscular re-education: Tandem balance eyes open; head turning; eyes closed 2 x 20 seconds each  Functional Activities: Hip hike at counter top 2 sets of 10 for 3 seconds  40981: Review HEP with 3 new activities added today   02/03/24:  TherEx Nustep: level 6 x 6 minutes Standing hip hiking x 15 bil c UE support Standing hip abd x 10 bil  Side lying: reverse clams x 12 Supine glute stretch pushing knee away, pt has trouble tolerating Manual Percussion to Rt lateral hip, IT band, glute medius ISATM to Rt glutes, Rt IT band, Rt hamstrings and Rt lateral quad, Rt TFL Self Care:  STM using  tennis ball, pt able to return demonstration TherAct Step ups x 10 4 inch step Standing hip extension x 10 bil c UE support   01/30/24 Nustep L3 seat 5 all four extremities x8 minutes  MMT and PSFS   Bridges x12 Isometric hooklying clam x10 B (attempted supine clams but too painful) HS stretches 2x30 seconds B Cautious piriformis stretches 3x10 seconds R, 2x30 seconds L   Ionto patch #2 4 hours/1.82mL dex lateral R hip   PATIENT EDUCATION:  Education details: exam findings, POC, HEP, ionto wear time/precautions  Person educated: Patient Education method: Programmer, multimedia, Demonstration, and Handouts Education comprehension: verbalized understanding, returned demonstration, and needs further education  HOME EXERCISE PROGRAM: Access Code: [email or web address] URL: [email or web address] Date: 02/06/2024 Prepared by: Terral Ferrari  Exercises - Supine Bridge  - 1 x daily - 7 x weekly - 1-2 sets - 10 reps - 2 seconds  hold - Supine Active Straight Leg Raise  - 1 x daily - 7 x weekly - 1-2 sets - 10 reps - Seated Knee Extension with Anchored Resistance  - 1 x daily - 7 x weekly - 1-2 sets - 10 reps - 1 second  hold - Seated Knee Flexion with Anchored Resistance  - 1 x daily - 7 x weekly - 1-2 sets - 10 reps - 1 second  hold - Clamshell  - 1 x daily - 7 x weekly - 2 sets - 10 reps - 3 seconds hold - Tandem Stance  -  1 x daily - 7 x weekly - 1 sets - 5 reps - 20 second hold - Standing Hip Hiking  - 3-5 x daily - 7 x weekly - 1 sets - 10 reps - 3 seconds hold  ASSESSMENT:  CLINICAL IMPRESSION: Kaitlyn Hudson is still having groin and postero-lateral hip soreness and pain.  We added some early phase hip abductor strengthening exercises to her current program to address hip abductors weakness and impairments with standing and walking.  And is following up with Dr. Vaughn Georges in about 2 weeks and we discussed the importance of appropriate hip abductor strengthening while avoiding overuse.  Continue to  appropriately progress hip abductor strengthening to improve standing and walking endurance and function as appropriate while avoiding overuse.     EVAL: Patient is a 73 y.o. F who was seen today for physical therapy evaluation and treatment for  Diagnosis  M16.11 (ICD-10-CM) - Unilateral primary osteoarthritis, right hip  S76.011D (ICD-10-CM) - Tear of right gluteus minimus tendon, subsequent encounter  R29.898 (ICD-10-CM) - Weakness of right hip  . Objectives as above, she does seem to have been compensating for this injury for quite some time. We also tried some ionto to lateral R hip today to help reduce inflammation and improve exercise tolerance. Will make every effort to address subjective concerns and reduce pain/improve function moving forward.   OBJECTIVE IMPAIRMENTS: Abnormal gait, decreased mobility, difficulty walking, decreased ROM, decreased strength, increased fascial restrictions, increased muscle spasms, obesity, and pain.   ACTIVITY LIMITATIONS: sitting, standing, sleeping, stairs, transfers, and locomotion level  PARTICIPATION LIMITATIONS: meal prep, cleaning, laundry, driving, shopping, community activity, and yard work  PERSONAL FACTORS: Age, Behavior pattern, Education, Fitness, Past/current experiences, Social background, and Time since onset of injury/illness/exacerbation are also affecting patient's functional outcome.   REHAB POTENTIAL: Fair chronicity of pain/injury, sedentary lifestyle, body habitus, severe bone on bone R hip joint   CLINICAL DECISION MAKING: Evolving/moderate complexity  EVALUATION COMPLEXITY: Moderate   GOALS: Goals reviewed with patient? No  SHORT TERM GOALS: Target date: 01/30/2024   Will be compliant with appropriate progressive HEP  Baseline: Goal status: Met 02/06/2024  2.  Pain to be no more than 6/10 at worst R hip  Baseline:  Goal status: Met 02/06/2024  3.  Gait pattern to have normalized  Baseline:  Goal status: ONGOING  02/06/2024  4.  General LE flexibility to be no more than 25% limited  Baseline:  Goal status: ONGOING 01/30/24    LONG TERM GOALS: Target date: 02/27/2024    MMT to have improved by 1 grade all weak groups  Baseline:  Goal status: ONGOING 01/30/24  2.  Pain in R hip to be no more than 3/10 at worst with all functional tasks including laying on her R side and getting in/out of the car  Baseline:  Goal status: Ongoing 02/06/2024  3.  Pain with sitting to have resolved  Baseline:  Goal status: INITIAL  4.  Will be able to stand/walk as desired for home care tasks and exercise as desired with R hip pain no more than 3/10 Baseline:  Goal status: Ongoing 02/06/2024  5.  PSFS to have improved by at least 2 points  Baseline:  Goal status: ONGOING 01/30/24     PLAN:  PT FREQUENCY: 2x/week  PT DURATION: 8 weeks  PLANNED INTERVENTIONS: 97750- Physical Performance Testing, 97110-Therapeutic exercises, 97530- Therapeutic activity, [id number]- Neuromuscular re-education, 97535- Self Care, 40981- Manual therapy, [id number]- Gait training, [phone]- Aquatic Therapy, [phone]- Electrical stimulation (unattended),  16109- Vasopneumatic device, [id number]- Ultrasound, 60454- Ionotophoresis 4mg /ml Dexamethasone , Taping, and Dry Needling  PLAN FOR NEXT SESSION: Appropriate hip abductor strengthening while avoiding overuse.  Gluteus minimus partial tear is present.  Joli Neas, PT, MPT 02/06/24 3:23 PM   02/06/24 3:23 PM

## 2024-02-11 ENCOUNTER — Ambulatory Visit: Admitting: Rehabilitative and Restorative Service Providers"

## 2024-02-11 ENCOUNTER — Encounter: Payer: Self-pay | Admitting: Rehabilitative and Restorative Service Providers"

## 2024-02-11 DIAGNOSIS — M6281 Muscle weakness (generalized): Secondary | ICD-10-CM | POA: Diagnosis not present

## 2024-02-11 DIAGNOSIS — M25551 Pain in right hip: Secondary | ICD-10-CM | POA: Diagnosis not present

## 2024-02-11 DIAGNOSIS — R262 Difficulty in walking, not elsewhere classified: Secondary | ICD-10-CM

## 2024-02-11 NOTE — Therapy (Signed)
 OUTPATIENT PHYSICAL THERAPY LOWER EXTREMITY TREATMENT   Patient Name: Kaitlyn Hudson MRN: [id number] DOB:[birth year], 73 y.o., female Today's Date: 02/11/2024  END OF SESSION:  PT End of Session - 02/11/24 1347     Visit Number 5    Number of Visits 17    Date for PT Re-Evaluation 02/27/24    Authorization Type HTA    Authorization Time Period 01/02/24 to 02/27/24    Progress Note Due on Visit 10    PT Start Time 1346    PT Stop Time 1429    PT Time Calculation (min) 43 min    Activity Tolerance Patient tolerated treatment well;No increased pain;Patient limited by pain    Behavior During Therapy [facility] for tasks assessed/performed             Past Medical History:  Diagnosis Date   Allergic rhinitis    Allergy    Anxiety 04/02/2016   Cataract    Chest pressure 05/11/2018   Depression    Dyslipidemia 04/08/2018   Exertional shortness of breath 05/11/2018   Family history of systemic lupus erythematosus (SLE) in mother 04/08/2018   Fibromyalgia    Former smoker 04/08/2018   GERD (gastroesophageal reflux disease)    History of cervical dysplasia 04/02/2016   2013; hysterectomy d/t recurrent dysplasia s/p LEEP   History of depression 03/16/2018   History of hyperlipidemia 03/16/2018   Hypercholesteremia 04/02/2016   Osteoarthritis    Primary osteoarthritis of both hands 04/08/2018   Sjogren's syndrome with keratoconjunctivitis sicca (HCC) 04/08/2018   +ANA, +Ro, +La, sicca symptoms, fatigue   Sleep apnea    Trochanteric bursitis, right hip 01/09/2018   Past Surgical History:  Procedure Laterality Date   CATARACT EXTRACTION, BILATERAL  07/2022   HYSTEROTOMY     KNEE SURGERY Right    LYMPH NODE BIOPSY     groin    NASAL SEPTUM SURGERY  12/2020   Patient Active Problem List   Diagnosis Date Noted   Unilateral primary osteoarthritis, left knee 05/23/2022   Acute lateral meniscus tear of left knee 05/07/2022   Pain in right hip 03/28/2022   Trochanteric bursitis,  right hip 03/28/2022   CAD in native artery 06/26/2018   Sleep apnea 05/15/2018   Angina pectoris (HCC) 05/15/2018   SOB (shortness of breath) on exertion 05/11/2018   Chest pressure 05/11/2018   Fibromyalgia 05/11/2018   Sjogren's syndrome with keratoconjunctivitis sicca (HCC) 04/08/2018   Former smoker 04/08/2018   Family history of systemic lupus erythematosus (SLE) in mother 04/08/2018   Dyslipidemia 04/08/2018   Primary osteoarthritis of both hands 04/08/2018   History of depression 03/16/2018   History of hyperlipidemia 03/16/2018   Anxiety 04/02/2016   History of cervical dysplasia 04/02/2016   Hyperlipidemia 04/02/2016    PCP: Rex Castor, MD   REFERRING PROVIDER: Shauna Del, DO  REFERRING DIAG:  Diagnosis  M16.11 (ICD-10-CM) - Unilateral primary osteoarthritis, right hip  S76.011D (ICD-10-CM) - Tear of right gluteus minimus tendon, subsequent encounter  R29.898 (ICD-10-CM) - Weakness of right hip    THERAPY DIAG:  Pain in right hip  Difficulty in walking, not elsewhere classified  Muscle weakness (generalized)  Rationale for Evaluation and Treatment: Rehabilitation  ONSET DATE: several years ago   SUBJECTIVE:   SUBJECTIVE STATEMENT: Kaitlyn Hudson reports continued good HEP compliance.  Sleep is usually not affected, but Saturday night was rough after 90 minutes of mowing on her zero turn.  EVAL: This problem started out of nowhere. I do have OA,  Dr. Vaughn Georges said that I have some partially torn tendons on the outside. I limp when I walk, its also very painful when I first get up, getting into the car can be very painful, sleeping on right side can be difficult. This was first diagnosed as bursitis years ago, but recently they found the torn tendon on the MRI. After an injection I didn't have much pain at all, then started getting some groin pain after that. Limiting activity makes it not hurt, but then when I move again its worse, its a catch 22.  PERTINENT  HISTORY: See above  PAIN:  Are you having pain? Yes: NPRS scale: 2-6/10 this week (6/10 after mowing) Pain location: Rt hip  Pain description: ache and sometimes stab with transfers Aggravating factors: sitting, getting in/out of car, sleeping on right side, low chairs   Relieving factors: extra strength tylenol, limiting activity   PRECAUTIONS: None  RED FLAGS: None   WEIGHT BEARING RESTRICTIONS: No  FALLS:  Has patient fallen in last 6 months? 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 Vaughn Georges on the 20th  OBJECTIVE:  Note: Objective measures were completed at Evaluation unless otherwise noted.  DIAGNOSTIC FINDINGS:   2 views of the right hip including AP pelvis and right hip lateral film  were ordered and reviewed by myself today.  X-rays demonstrate MR head  well-seated within the lung.  The superior lateral aspect of the joint is  relatively well-preserved but there is advanced arthritic change with near  bone-on-bone change over the inferior medial aspect of the joint with bony  sclerosis of the inferior acetabulum.  PATIENT SURVEYS:    Patient-Specific Activity Scoring Scheme  0 represents "unable to perform." 10 represents "able to perform at prior level. 0 1 2 3 4 5 6 7 8 9  10 (Date and Score)   Activity Eval   01/30/24  1. Getting in car   5 5   2. Pain after sitting   6 5  3. Pain after standing for 15 minutes  5 7  4. Laying on right side  5 6  5.    Score 5.5 5.75 (adjusted scoring PRN as pt gave some answers on NPRS instead of difficulty scale)   Total score = sum of the activity  scores/number of activities Minimum detectable change (90%CI) for average score = 2 points Minimum detectable change (90%CI) for single activity score = 3 points   COGNITION: Overall cognitive status: Within functional limits for tasks assessed     SENSATION: Not tested    MUSCLE LENGTH:  HS mod limitation B Piriformis mild limitation B     PALPATION:  Tender lateral R thigh, sore R glutes and piriformis     LOWER EXTREMITY MMT:  MMT Right eval Left eval Right 01/30/24 Left 01/30/24  Hip flexion 3 3+ 4 4  Hip extension      Hip abduction 3- pain  3 3- pain 3  Hip adduction      Hip internal rotation      Hip external rotation      Knee flexion 4- 4 4- 4-  Knee extension 4-  4 4 4   Ankle dorsiflexion 5 5    Ankle plantarflexion      Ankle inversion      Ankle eversion       (Blank rows = not tested)  LOWER EXTREMITY SPECIAL TESTS:  Hip special tests: Portia Brittle (FABER) test: positive  and FADIR (+) in context of known advanced bone on bone in hip joint     GAIT: Distance walked: in clinic distances  Assistive device utilized: None Level of assistance: Complete Independence Comments: antalgic                                                                                                                                 TREATMENT DATE:  02/11/2024 Nu Step Level 6 for 7 minutes Side lie clams 2 sets of 10 for 3 seconds with Black Thera-Band Bridging 10 x 5 seconds Modified Thomas stretch 4 x 20 seconds  Neuromuscular re-education: Tandem balance head turning; eyes closed 2 x 20 seconds each Single leg balance 3 x 10 seconds each side Wobble board side to side 2 x 1 minute  Functional Activities: Hip hike at counter top 2 sets of 10 for 3 seconds Single Leg Press 2 sets of 10 x each side with slow eccentrics 50# Step-down off 4 inch step 10 x each side with slow eccentrics   02/06/2024 Nu Step Level 6 for 6 minutes Side lie clams 2 sets of 10 for 3  seconds  Neuromuscular re-education: Tandem balance eyes open; head turning; eyes closed 2 x 20 seconds each  Functional Activities: Hip hike at counter top 2 sets of 10 for 3 seconds  56213: Review HEP with 3 new activities added today   02/03/24:  TherEx Nustep: level 6 x 6 minutes Standing hip hiking x 15 bil c UE support Standing hip abd x 10 bil  Side lying: reverse clams x 12 Supine glute stretch pushing knee away, pt has trouble tolerating Manual Percussion to Rt lateral hip, IT band, glute medius ISATM to Rt glutes, Rt IT band, Rt hamstrings and Rt lateral quad, Rt TFL Self Care:  STM using tennis ball, pt able to return demonstration TherAct Step ups x 10 4 inch step Standing hip extension x 10 bil c UE support   PATIENT EDUCATION:  Education details: exam findings, POC, HEP, ionto wear time/precautions  Person educated: Patient Education method: Explanation, Demonstration, and Handouts Education comprehension: verbalized understanding, returned demonstration, and needs further education  HOME EXERCISE PROGRAM: Access Code: [email or web address] URL: [email or web address] Date: 02/06/2024 Prepared by: Terral Ferrari  Exercises - Supine Bridge  - 1 x daily - 7 x weekly - 1-2 sets - 10 reps - 2 seconds  hold - Supine Active Straight Leg Raise  - 1 x daily - 7 x weekly - 1-2 sets - 10 reps - Seated Knee Extension with Anchored Resistance  - 1 x daily - 7 x weekly - 1-2 sets - 10  reps - 1 second  hold - Seated Knee Flexion with Anchored Resistance  - 1 x daily - 7 x weekly - 1-2 sets - 10 reps - 1 second  hold - Clamshell  - 1 x daily - 7 x weekly - 2 sets - 10 reps - 3 seconds hold - Tandem Stance  - 1 x daily - 7 x weekly - 1 sets - 5 reps - 20 second hold - Standing Hip Hiking  - 3-5 x daily - 7 x weekly - 1 sets - 10 reps - 3 seconds hold  ASSESSMENT:  CLINICAL IMPRESSION: Focus remains appropriate hip abductor strengthening while avoiding overuse.  Kaitlyn Hudson has  a partial gluteal tear and is working hard to either avoid or prepare for surgery.  We will continue to appropriately progress hip abductor strengthening to improve standing and walking endurance and function as appropriate while avoiding overuse.     EVAL: Patient is a 73 y.o. F who was seen today for physical therapy evaluation and treatment for  Diagnosis  M16.11 (ICD-10-CM) - Unilateral primary osteoarthritis, right hip  S76.011D (ICD-10-CM) - Tear of right gluteus minimus tendon, subsequent encounter  R29.898 (ICD-10-CM) - Weakness of right hip  . Objectives as above, she does seem to have been compensating for this injury for quite some time. We also tried some ionto to lateral R hip today to help reduce inflammation and improve exercise tolerance. Will make every effort to address subjective concerns and reduce pain/improve function moving forward.   OBJECTIVE IMPAIRMENTS: Abnormal gait, decreased mobility, difficulty walking, decreased ROM, decreased strength, increased fascial restrictions, increased muscle spasms, obesity, and pain.   ACTIVITY LIMITATIONS: sitting, standing, sleeping, stairs, transfers, and locomotion level  PARTICIPATION LIMITATIONS: meal prep, cleaning, laundry, driving, shopping, community activity, and yard work  PERSONAL FACTORS: Age, Behavior pattern, Education, Fitness, Past/current experiences, Social background, and Time since onset of injury/illness/exacerbation are also affecting patient's functional outcome.   REHAB POTENTIAL: Fair chronicity of pain/injury, sedentary lifestyle, body habitus, severe bone on bone R hip joint   CLINICAL DECISION MAKING: Evolving/moderate complexity  EVALUATION COMPLEXITY: Moderate   GOALS: Goals reviewed with patient? No  SHORT TERM GOALS: Target date: 01/30/2024   Will be compliant with appropriate progressive HEP  Baseline: Goal status: Met 02/06/2024  2.  Pain to be no more than 6/10 at worst R hip  Baseline:   Goal status: Met 02/06/2024  3.  Gait pattern to have normalized  Baseline:  Goal status: ONGOING 02/11/2024  4.  General LE flexibility to be no more than 25% limited  Baseline:  Goal status: ONGOING 01/30/24    LONG TERM GOALS: Target date: 02/27/2024    MMT to have improved by 1 grade all weak groups  Baseline:  Goal status: ONGOING 01/30/24  2.  Pain in R hip to be no more than 3/10 at worst with all functional tasks including laying on her R side and getting in/out of the car  Baseline:  Goal status: Ongoing 02/11/2024  3.  Pain with sitting to have resolved  Baseline:  Goal status: INITIAL  4.  Will be able to stand/walk as desired for home care tasks and exercise as desired with R hip pain no more than 3/10 Baseline:  Goal status: Ongoing 02/11/2024  5.  PSFS to have improved by at least 2 points  Baseline:  Goal status: ONGOING 01/30/24     PLAN:  PT FREQUENCY: 2x/week  PT DURATION: 8 weeks  PLANNED INTERVENTIONS: [id number]-  Physical Performance Testing, 97110-Therapeutic exercises, 97530- Therapeutic activity, [id number]- Neuromuscular re-education, [id number]- Self Care, 16109- Manual therapy, [id number]- Gait training, [phone]- Aquatic Therapy, [phone]- Electrical stimulation (unattended), 97016- Vasopneumatic device, [id number]- Ultrasound, [id number]- Ionotophoresis 4mg /ml Dexamethasone , Taping, and Dry Needling  PLAN FOR NEXT SESSION: Appropriate hip abductor strengthening while avoiding overuse.  Gluteus minimus partial tear is present.  Objective reassessment with patient specific functional score update.  Joli Neas, PT, MPT 02/11/24 3:28 PM   02/11/24 3:28 PM

## 2024-02-13 ENCOUNTER — Encounter: Payer: Self-pay | Admitting: Rehabilitative and Restorative Service Providers"

## 2024-02-13 ENCOUNTER — Ambulatory Visit: Admitting: Rehabilitative and Restorative Service Providers"

## 2024-02-13 DIAGNOSIS — R262 Difficulty in walking, not elsewhere classified: Secondary | ICD-10-CM

## 2024-02-13 DIAGNOSIS — M6281 Muscle weakness (generalized): Secondary | ICD-10-CM

## 2024-02-13 DIAGNOSIS — M25551 Pain in right hip: Secondary | ICD-10-CM

## 2024-02-13 NOTE — Therapy (Signed)
 OUTPATIENT PHYSICAL THERAPY LOWER EXTREMITY TREATMENT/PROGRESS NOTE  Progress Note Reporting Period 01/02/2024 to 02/13/2024  See note below for Objective Data and Assessment of Progress/Goals.     Patient Name: Kaitlyn Hudson MRN: [id number] DOB:Sep 10, 1950, 73 y.o., female Today's Date: 02/13/2024  END OF SESSION:  PT End of Session - 02/13/24 1348     Visit Number 6    Number of Visits 17    Date for PT Re-Evaluation 02/27/24    Authorization Type HTA    Authorization Time Period 01/02/24 to 02/27/24    Progress Note Due on Visit 10    PT Start Time 1347    PT Stop Time 1430    PT Time Calculation (min) 43 min    Activity Tolerance Patient tolerated treatment well;No increased pain;Patient limited by pain    Behavior During Therapy [facility] for tasks assessed/performed           Past Medical History:  Diagnosis Date   Allergic rhinitis    Allergy    Anxiety 04/02/2016   Cataract    Chest pressure 05/11/2018   Depression    Dyslipidemia 04/08/2018   Exertional shortness of breath 05/11/2018   Family history of systemic lupus erythematosus (SLE) in mother 04/08/2018   Fibromyalgia    Former smoker 04/08/2018   GERD (gastroesophageal reflux disease)    History of cervical dysplasia 04/02/2016   2013; hysterectomy d/t recurrent dysplasia s/p LEEP   History of depression 03/16/2018   History of hyperlipidemia 03/16/2018   Hypercholesteremia 04/02/2016   Osteoarthritis    Primary osteoarthritis of both hands 04/08/2018   Sjogren's syndrome with keratoconjunctivitis sicca (HCC) 04/08/2018   +ANA, +Ro, +La, sicca symptoms, fatigue   Sleep apnea    Trochanteric bursitis, right hip 01/09/2018   Past Surgical History:  Procedure Laterality Date   CATARACT EXTRACTION, BILATERAL  07/2022   HYSTEROTOMY     KNEE SURGERY Right    LYMPH NODE BIOPSY     groin    NASAL SEPTUM SURGERY  12/2020   Patient Active Problem List   Diagnosis Date Noted   Unilateral primary  osteoarthritis, left knee 05/23/2022   Acute lateral meniscus tear of left knee 05/07/2022   Pain in right hip 03/28/2022   Trochanteric bursitis, right hip 03/28/2022   CAD in native artery 06/26/2018   Sleep apnea 05/15/2018   Angina pectoris (HCC) 05/15/2018   SOB (shortness of breath) on exertion 05/11/2018   Chest pressure 05/11/2018   Fibromyalgia 05/11/2018   Sjogren's syndrome with keratoconjunctivitis sicca (HCC) 04/08/2018   Former smoker 04/08/2018   Family history of systemic lupus erythematosus (SLE) in mother 04/08/2018   Dyslipidemia 04/08/2018   Primary osteoarthritis of both hands 04/08/2018   History of depression 03/16/2018   History of hyperlipidemia 03/16/2018   Anxiety 04/02/2016   History of cervical dysplasia 04/02/2016   Hyperlipidemia 04/02/2016    PCP: Kaitlyn Castor, MD   REFERRING PROVIDER: Shauna Del, DO  REFERRING DIAG:  Diagnosis  M16.11 (ICD-10-CM) - Unilateral primary osteoarthritis, right hip  S76.011D (ICD-10-CM) - Tear of right gluteus minimus tendon, subsequent encounter  R29.898 (ICD-10-CM) - Weakness of right hip    THERAPY DIAG:  Pain in right hip  Difficulty in walking, not elsewhere classified  Muscle weakness (generalized)  Rationale for Evaluation and Treatment: Rehabilitation  ONSET DATE: several years ago   SUBJECTIVE:   SUBJECTIVE STATEMENT: Kaitlyn Hudson reports she has been very sore the past 2 days with a lot of standing.  Sleep  has been OK.  Kaitlyn Hudson reports she did better last week when mowing on her zero turn, but overall gluteal and groin symptoms are similar to those experienced at evaluation over a month ago.  EVAL: This problem started out of nowhere. I do have OA, Dr. Vaughn Hudson said that I have some partially torn tendons on the outside. I limp when I walk, its also very painful when I first get up, getting into the car can be very painful, sleeping on right side can be difficult. This was first diagnosed as bursitis years  ago, but recently they found the torn tendon on the MRI. After an injection I didn't have much pain at all, then started getting some groin pain after that. Limiting activity makes it not hurt, but then when I move again its worse, its a catch 22.  PERTINENT HISTORY: See above  PAIN:  Are you having pain? Yes: NPRS scale: 2-6/10 this week (6/10 after mowing) Pain location: Rt hip  Pain description: ache and sometimes stab with transfers Aggravating factors: getting up after prolonged sitting, getting in/out of car is improving, sleeping on right side   Relieving factors: extra strength tylenol, limiting activity   PRECAUTIONS: None  RED FLAGS: None   WEIGHT BEARING RESTRICTIONS: No  FALLS:  Has patient fallen in last 6 months? 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 Vaughn Hudson on the 20th  OBJECTIVE:  Note: Objective measures were completed at Evaluation unless otherwise noted.  DIAGNOSTIC FINDINGS:   2 views of the right hip including AP pelvis and right hip lateral film  were ordered and reviewed by myself today.  X-rays demonstrate MR head  well-seated within the lung.  The superior lateral aspect of the joint is  relatively well-preserved but there is advanced arthritic change with near  bone-on-bone change over the inferior medial aspect of the joint with bony  sclerosis of the inferior acetabulum.  PATIENT SURVEYS:    Patient-Specific Activity Scoring Scheme  0 represents "unable to perform." 10 represents "able to perform at prior level. 0 1 2 3 4 5 6 7 8 9  10 (Date and Score)   Activity Eval    01/30/24 02/13/2024  1. Getting in car   5 5  5   2. Pain after sitting   6 5 5   3. Pain after standing for 15 minutes  5 7 7   4. Laying on right side  5 6 6   5.     Score 5.5 5.75 (adjusted scoring PRN as pt gave some answers on NPRS instead of difficulty scale) 5.75    Total score = sum of the activity scores/number of activities Minimum detectable change (90%CI) for average score = 2 points Minimum detectable change (90%CI) for single activity score = 3 points   COGNITION: Overall cognitive status: Within functional limits for tasks assessed     SENSATION: Not tested    MUSCLE LENGTH:  HS mod limitation B Piriformis mild limitation B  02/13/2024: Lt/Rt in degrees Hip flexion: 100/85 Hamstrings: 45/40 Hip IR: 20/9 Hip ER: 33/26   PALPATION:  Tender lateral R thigh, sore R glutes and piriformis  LOWER EXTREMITY MMT:  MMT Right eval Left eval Right 01/30/24 Left 01/30/24 Left/Right 02/13/2024  Hip flexion 3 3+ 4 4 5-/Pain limited  Hip extension       Hip abduction 3- pain  3 3- pain 3 4-/Pain limited  Hip adduction       Hip internal rotation       Hip external rotation       Knee flexion 4- 4 4- 4-   Knee extension 4- 4 4 4    Ankle dorsiflexion 5 5     Ankle plantarflexion       Ankle inversion       Ankle eversion        (Blank rows = not tested)  LOWER EXTREMITY SPECIAL TESTS:  Hip special tests: Portia Brittle (FABER) test: positive  and FADIR (+) in context of known advanced bone on bone in hip joint     GAIT: Distance walked: in clinic distances  Assistive device utilized: None Level of assistance: Complete Independence Comments: antalgic                                                                                                                                 TREATMENT DATE:  02/13/2024 Side lie clams 2 sets of 10 for 3 seconds with Black Thera-Band Bridging 10 x 5 seconds Modified Thomas stretch 4 x 20 seconds  Neuromuscular  re-education: Tandem balance head turning; eyes closed 2 x 20 seconds each  Functional Activities: Hip hike at counter top 2 sets of 10 for 3 seconds  16109: Reviewed examination findings from today, discussed referral back to Dr. Vaughn Hudson to speak with him about possible additional imaging and whether or not referral to orthopedic surgeon might be appropriate.  Ice Right gluteals 5 minutes while discussing today's treatment and findings   02/11/2024 Nu Step Level 6 for 7 minutes Side lie clams 2 sets of 10 for 3 seconds with Black Thera-Band Bridging 10 x 5 seconds Modified Thomas stretch 4 x 20 seconds  Neuromuscular re-education: Tandem balance head turning; eyes closed 2 x 20 seconds each Single leg balance 3 x 10 seconds each side Wobble board side to side 2 x 1 minute  Functional Activities: Hip hike at counter top 2 sets of 10 for 3 seconds Single Leg Press 2 sets of 10 x each side with slow eccentrics 50# Step-down off 4 inch step 10 x each side with slow eccentrics   02/06/2024 Nu Step Level 6 for 6 minutes Side lie clams 2 sets of 10 for 3 seconds  Neuromuscular re-education: Tandem balance eyes open; head turning; eyes closed 2 x 20 seconds each  Functional Activities: Hip hike at counter top 2 sets of 10 for 3 seconds  60454: Review HEP with 3 new activities added today   PATIENT EDUCATION:  Education details: exam findings, POC, HEP, ionto wear time/precautions  Person educated: Patient Education method: Programmer, multimedia, Facilities manager, and Handouts Education  comprehension: verbalized understanding, returned demonstration, and needs further education  HOME EXERCISE PROGRAM: Access Code: [email or web address] URL: [email or web address] Date: 02/06/2024 Prepared by: Terral Ferrari  Exercises - Supine Bridge  - 1 x daily - 7 x weekly - 1-2 sets - 10 reps - 2 seconds  hold - Supine Active Straight Leg Raise  - 1 x daily - 7 x weekly - 1-2 sets - 10 reps -  Seated Knee Extension with Anchored Resistance  - 1 x daily - 7 x weekly - 1-2 sets - 10 reps - 1 second  hold - Seated Knee Flexion with Anchored Resistance  - 1 x daily - 7 x weekly - 1-2 sets - 10 reps - 1 second  hold - Clamshell  - 1 x daily - 7 x weekly - 2 sets - 10 reps - 3 seconds hold - Tandem Stance  - 1 x daily - 7 x weekly - 1 sets - 5 reps - 20 second hold - Standing Hip Hiking  - 3-5 x daily - 7 x weekly - 1 sets - 10 reps - 3 seconds hold  ASSESSMENT:  CLINICAL IMPRESSION: Kaitlyn Hudson has noted some progress in her ability to stand for a bit longer before increasing right gluteal and groin symptoms.  The focus of her physical therapy has been and remains appropriate hip abductor strengthening while avoiding overuse.  Kaitlyn Hudson has a partial gluteal tear and will follow-up with Dr. Vaughn Hudson on Monday where I encouraged her to speak with him as to whether or not she might be appropriate for surgery.  She might benefit from additional imaging and referral to orthopedic surgeon, although Dr. Vaughn Hudson is best qualified to make this determination and I will await his recommendations.  We will continue to appropriately progress hip abductor strengthening to improve standing and walking endurance and function as appropriate while avoiding overuse.     EVAL: Patient is a 73 y.o. F who was seen today for physical therapy evaluation and treatment for  Diagnosis  M16.11 (ICD-10-CM) - Unilateral primary osteoarthritis, right hip  S76.011D (ICD-10-CM) - Tear of right gluteus minimus tendon, subsequent encounter  R29.898 (ICD-10-CM) - Weakness of right hip  . Objectives as above, she does seem to have been compensating for this injury for quite some time. We also tried some ionto to lateral R hip today to help reduce inflammation and improve exercise tolerance. Will make every effort to address subjective concerns and reduce pain/improve function moving forward.   OBJECTIVE IMPAIRMENTS: Abnormal gait, decreased  mobility, difficulty walking, decreased ROM, decreased strength, increased fascial restrictions, increased muscle spasms, obesity, and pain.   ACTIVITY LIMITATIONS: sitting, standing, sleeping, stairs, transfers, and locomotion level  PARTICIPATION LIMITATIONS: meal prep, cleaning, laundry, driving, shopping, community activity, and yard work  PERSONAL FACTORS: Age, Behavior pattern, Education, Fitness, Past/current experiences, Social background, and Time since onset of injury/illness/exacerbation are also affecting patient's functional outcome.   REHAB POTENTIAL: Fair chronicity of pain/injury, sedentary lifestyle, body habitus, severe bone on bone R hip joint   CLINICAL DECISION MAKING: Evolving/moderate complexity  EVALUATION COMPLEXITY: Moderate   GOALS: Goals reviewed with patient? No  SHORT TERM GOALS: Target date: 01/30/2024   Will be compliant with appropriate progressive HEP  Baseline: Goal status: Met 02/06/2024  2.  Pain to be no more than 6/10 at worst R hip  Baseline:  Goal status: Met 02/06/2024  3.  Gait pattern to have normalized  Baseline:  Goal status: ONGOING 02/13/2024  4.  General LE flexibility  to be no more than 25% limited  Baseline:  Goal status: ONGOING 02/13/24    LONG TERM GOALS: Target date: 02/27/2024    MMT to have improved by 1 grade all weak groups  Baseline:  Goal status: ONGOING 02/13/24  2.  Pain in R hip to be no more than 3/10 at worst with all functional tasks including laying on her R side and getting in/out of the car  Baseline:  Goal status: Ongoing 02/13/2024  3.  Pain with sitting to have resolved  Baseline:  Goal status: Partially met 02/13/2024, sitting is fine with first few steps after prolonged sitting being rough  4.  Will be able to stand/walk as desired for home care tasks and exercise as desired with R hip pain no more than 3/10 Baseline:  Goal status: Ongoing 02/13/2024  5.  PSFS to have improved by at least 2 points   Baseline:  Goal status: ONGOING 02/13/24     PLAN:  PT FREQUENCY: 1-2 visits per week  PT DURATION: Up to 2 weeks, depending on how her follow-up with Dr. Vaughn Hudson goes next week  PLANNED INTERVENTIONS: 97750- Physical Performance Testing, 97110-Therapeutic exercises, 97530- Therapeutic activity, 97112- Neuromuscular re-education, 97535- Self Care, 14782- Manual therapy, [id number]- Gait training, [phone]- Aquatic Therapy, [id number]- Electrical stimulation (unattended), 97016- Vasopneumatic device, [id number]- Ultrasound, [id number]- Ionotophoresis 4mg /ml Dexamethasone , Taping, and Dry Needling  PLAN FOR NEXT SESSION: I did her appointment with Dr. Vaughn Hudson ago?  Is she appropriate for additional imaging or referral to orthopedic surgeon?    Joli Neas, PT, MPT 02/13/24 4:41 PM   02/13/24 4:41 PM

## 2024-02-16 ENCOUNTER — Other Ambulatory Visit (INDEPENDENT_AMBULATORY_CARE_PROVIDER_SITE_OTHER): Payer: Self-pay

## 2024-02-16 ENCOUNTER — Other Ambulatory Visit: Payer: Self-pay

## 2024-02-16 ENCOUNTER — Ambulatory Visit: Admitting: Sports Medicine

## 2024-02-16 DIAGNOSIS — M7061 Trochanteric bursitis, right hip: Secondary | ICD-10-CM | POA: Diagnosis not present

## 2024-02-16 DIAGNOSIS — M1611 Unilateral primary osteoarthritis, right hip: Secondary | ICD-10-CM | POA: Diagnosis not present

## 2024-02-16 DIAGNOSIS — M5136 Other intervertebral disc degeneration, lumbar region with discogenic back pain only: Secondary | ICD-10-CM | POA: Diagnosis not present

## 2024-02-16 DIAGNOSIS — M545 Low back pain, unspecified: Secondary | ICD-10-CM | POA: Diagnosis not present

## 2024-02-16 MED ORDER — METHYLPREDNISOLONE 4 MG PO TBPK: ORAL_TABLET | ORAL | 0 refills | Status: DC

## 2024-02-16 NOTE — Progress Notes (Unsigned)
 Patient advising that her right hip pain has increased x 2 days, patient states she has been standing and walking a lot Friday and saturday AM she had increase of pain in her right hip and also having pain in lower back radiating to right hip. Patient advising she has been exercising however she is not sure if she hurt her back doing the exercises for her hip.

## 2024-02-16 NOTE — Progress Notes (Unsigned)
 Kaitlyn Hudson - 73 y.o. female MRN [id number]  Date of birth: 19-Aug-1951  Office Visit Note: Visit Date: 02/16/2024 PCP: Lucius Sabins., MD Referred by: Lucius Sabins., MD  Subjective: Chief Complaint  Patient presents with   Right Hip - Pain   Lower Back - Pain   HPI: Kaitlyn Hudson is a pleasant 73 y.o. female who presents today for follow-up of right hip pain with OA and newer-onset left-sided low back pain.  Recently she has been doing a lot of standing and walking which has increased her pain of the right hip, but the last few days she has had newer onset left-sided low back pain.  This pain is not radiating down the leg.  The pain however is quite significant and is affecting her gait.  Prior to this, she had been tolerating her home hip exercises.  She is using Tylenol as needed.  She is managed on Plaquenil  200 mg daily via rheumatology.  Pertinent ROS were reviewed with the patient and found to be negative unless otherwise specified above in HPI.   Assessment & Plan: Visit Diagnoses:  1. Low back pain without sciatica, unspecified back pain laterality, unspecified chronicity   2. Degeneration of intervertebral disc of lumbar region with discogenic back pain   3. Trochanteric bursitis, right hip   4. Unilateral primary osteoarthritis, right hip    Plan: Impression is chronic right hip pain which has both advancing hip osteoarthritis as well as trochanteric bursitis of the right hip.  She receives about 75-80% relief from intra-articular hip injection back in March, although her pain never fully went away.  More of her pain is over the lateral hip today.  She has been progressing through her HEP with continued pain, through shared decision making did proceed with ultrasound-guided greater trochanteric injection, patient tolerated well.  Advised on activity modification for the next 2-3 days and then may return to her home exercises if pain allows.  In terms of her left-sided low  back pain, she does have a fair amount of degenerative disc disease of the lumbar spine seeming to have a flare that has tightened her lumbar paraspinal muscles with pain radiating over the left SI joint region.  Given the significance of her pain, we will start her on a 6-day Medrol  Dosepak.  Following this, she may continue her Plaquenil  200 mg via rheumatology.  Okay for ice/heat is Tylenol as well for breakthrough pain.  I would like to see her back in about 3 weeks to reevaluate all of the above.  I did discuss with her and given her degree of hip arthritis, if this does not settle down with the above, we could have her see one of our hip surgeons to at least have a discussion regarding hip replacement.  This versus infrequent ultrasound-guided injection.  Follow-up in 3 weeks.  Follow-up: Return in about 3 weeks (around 03/08/2024) for R-hip, L-low back .   Meds & Orders:  Meds ordered this encounter  Medications   methylPREDNISolone  (MEDROL  DOSEPAK) 4 MG TBPK tablet    Sig: Take per packet instruction. Taper dosing.    Dispense:  1 each    Refill:  0    Orders Placed This Encounter  Procedures   Large Joint Inj: R greater trochanter   XR Lumbar Spine 2-3 Views   US  Guided Needle Placement - No Linked Charges     Procedures: Large Joint Inj: R greater trochanter on 02/16/2024 4:51 PM Indications:  pain Details: 22 G 3.5 in needle, ultrasound-guided lateral approach Medications: 2 mL lidocaine  1 %; 2 mL bupivacaine  0.25 %; 6 mg betamethasone acetate-betamethasone sodium phosphate 6 (3-3) MG/ML Outcome: tolerated well, no immediate complications  US -Guided Greater Trochanteric Bursa Injection, Right After discussion on risks/benefits/indications and informed verbal consent was obtained, a timeout was performed. The patient was lying in lateral recumbent position on exam table. Using ultrasound guidance, the greater trochanter was identified. The area overlying the trochanteric bursa was  then prepped with Betadine and alcohol swabs. Following sterile precautions, ultrasound was reapplied to visualize needle guidance with a 22-gauge 3.5 needle utilizing an in-plane approach to inject the bursa with 2:2:1 lidocaine :bupivicaine:betamethasone. Delivery of the injectate was visualized into the region of hypoechoic fluid of the greater trochanteric bursa. Patient tolerated procedure well without immediate complications.    Procedure, treatment alternatives, risks and benefits explained, specific risks discussed. Consent was given by the patient. Immediately prior to procedure a time out was called to verify the correct patient, procedure, equipment, support staff and site/side marked as required. Patient was prepped and draped in the usual sterile fashion.          Clinical History: No specialty comments available.  She reports that she quit smoking about 13 years ago. Her smoking use included cigarettes. She started smoking about 43 years ago. She has a 15 pack-year smoking history. She has never been exposed to tobacco smoke. She has never used smokeless tobacco. No results for input(s): HGBA1C, LABURIC in the last 8760 hours.  Objective:    Physical Exam  Gen: Well-appearing, in no acute distress; non-toxic CV: Well-perfused. Warm.  Resp: Breathing unlabored on room air; no wheezing. Psych: Fluid speech in conversation; appropriate affect; normal thought process  Ortho Exam - Lumbar/SI: + TTP of the left sided lumbar paraspinal muscles in the mid to lower side.  There is also tenderness around the left SI joint.  There is some ginger range of motion with endrange flexion and extension.  Negative straight leg raise.  Patient ambulates with an antalgic gait today.  - Right hip: + TTP over the greater trochanteric region.  There is no redness or warmth.  There is associated pain with resisted hip abduction.  Positive FADIR test in the groin as well but to a lesser  extent.  Imaging:  XR Lumbar Spine 2-3 Views 2 views of the lumbar spine including AP and lateral film were ordered and  reviewed by myself today.  X-rays demonstrate multilevel degenerative disc  disease which is most significant at the L2-L3 and L5-S1 level.  There is  endplate bony sclerosis most notable at these locations.  There is some  mild to moderate facet arthropathy of the mid to lower lumbar spine.  Left  > right SI joint sclerotic changes present.  No acute fracture or loss of  disc height throughout the lumbar spine.   Past Medical/Family/Surgical/Social History: Medications & Allergies reviewed per EMR, new medications updated. Patient Active Problem List   Diagnosis Date Noted   Unilateral primary osteoarthritis, left knee 05/23/2022   Acute lateral meniscus tear of left knee 05/07/2022   Pain in right hip 03/28/2022   Trochanteric bursitis, right hip 03/28/2022   CAD in native artery 06/26/2018   Sleep apnea 05/15/2018   Angina pectoris (HCC) 05/15/2018   SOB (shortness of breath) on exertion 05/11/2018   Chest pressure 05/11/2018   Fibromyalgia 05/11/2018   Sjogren's syndrome with keratoconjunctivitis sicca (HCC) 04/08/2018   Former smoker  04/08/2018   Family history of systemic lupus erythematosus (SLE) in mother 04/08/2018   Dyslipidemia 04/08/2018   Primary osteoarthritis of both hands 04/08/2018   History of depression 03/16/2018   History of hyperlipidemia 03/16/2018   Anxiety 04/02/2016   History of cervical dysplasia 04/02/2016   Hyperlipidemia 04/02/2016   Past Medical History:  Diagnosis Date   Allergic rhinitis    Allergy    Anxiety 04/02/2016   Cataract    Chest pressure 05/11/2018   Depression    Dyslipidemia 04/08/2018   Exertional shortness of breath 05/11/2018   Family history of systemic lupus erythematosus (SLE) in mother 04/08/2018   Fibromyalgia    Former smoker 04/08/2018   GERD (gastroesophageal reflux disease)    History of  cervical dysplasia 04/02/2016   2013; hysterectomy d/t recurrent dysplasia s/p LEEP   History of depression 03/16/2018   History of hyperlipidemia 03/16/2018   Hypercholesteremia 04/02/2016   Osteoarthritis    Primary osteoarthritis of both hands 04/08/2018   Sjogren's syndrome with keratoconjunctivitis sicca (HCC) 04/08/2018   +ANA, +Ro, +La, sicca symptoms, fatigue   Sleep apnea    Trochanteric bursitis, right hip 01/09/2018   Family History  Problem Relation Age of Onset   Lupus Mother    Osteoarthritis Mother    Polymyalgia rheumatica Mother    Osteoarthritis Father    Alzheimer's disease Father    Breast cancer Maternal Grandmother    Colon cancer Neg Hx    Esophageal cancer Neg Hx    Rectal cancer Neg Hx    Stomach cancer Neg Hx    Past Surgical History:  Procedure Laterality Date   CATARACT EXTRACTION, BILATERAL  07/2022   HYSTEROTOMY     KNEE SURGERY Right    LYMPH NODE BIOPSY     groin    NASAL SEPTUM SURGERY  12/2020   Social History   Occupational History   Not on file  Tobacco Use   Smoking status: Former    Current packs/day: 0.00    Average packs/day: 0.5 packs/day for 30.0 years (15.0 ttl pk-yrs)    Types: Cigarettes    Start date: 09/02/1980    Quit date: 09/02/2010    Years since quitting: 13.4    Passive exposure: Never   Smokeless tobacco: Never  Vaping Use   Vaping status: Never Used  Substance and Sexual Activity   Alcohol use: No   Drug use: Never   Sexual activity: Not on file

## 2024-02-17 ENCOUNTER — Encounter: Payer: Self-pay | Admitting: Sports Medicine

## 2024-02-17 MED ORDER — BUPIVACAINE HCL 0.25 % IJ SOLN: 2.0000 mL | INTRAMUSCULAR | Status: AC | PRN

## 2024-02-17 MED ORDER — BETAMETHASONE SOD PHOS & ACET 6 (3-3) MG/ML IJ SUSP
6.0000 mg | INTRAMUSCULAR | Status: AC | PRN
  Administered 2024-02-16: 6 mg via INTRA_ARTICULAR

## 2024-02-17 MED ORDER — LIDOCAINE HCL 1 % IJ SOLN: 2.0000 mL | INTRAMUSCULAR | Status: AC | PRN

## 2024-02-18 ENCOUNTER — Encounter: Payer: Self-pay | Admitting: Rehabilitative and Restorative Service Providers"

## 2024-02-18 ENCOUNTER — Ambulatory Visit: Admitting: Rehabilitative and Restorative Service Providers"

## 2024-02-18 DIAGNOSIS — M25551 Pain in right hip: Secondary | ICD-10-CM | POA: Diagnosis not present

## 2024-02-18 DIAGNOSIS — M6281 Muscle weakness (generalized): Secondary | ICD-10-CM | POA: Diagnosis not present

## 2024-02-18 DIAGNOSIS — R262 Difficulty in walking, not elsewhere classified: Secondary | ICD-10-CM

## 2024-02-18 NOTE — Therapy (Signed)
 OUTPATIENT PHYSICAL THERAPY LOWER EXTREMITY TREATMENT NOTE    Patient Name: Kaitlyn Hudson MRN: [id number] DOB:Jan 01, 1951, 73 y.o., female Today's Date: 02/18/2024  END OF SESSION:  PT End of Session - 02/18/24 1531     Visit Number 7    Number of Visits 17    Date for PT Re-Evaluation 02/27/24    Authorization Type HTA    Authorization Time Period 01/02/24 to 02/27/24    Progress Note Due on Visit 10    PT Start Time 1523    PT Stop Time 1605    PT Time Calculation (min) 42 min    Activity Tolerance Patient tolerated treatment well;No increased pain    Behavior During Therapy [facility] for tasks assessed/performed            Past Medical History:  Diagnosis Date   Allergic rhinitis    Allergy    Anxiety 04/02/2016   Cataract    Chest pressure 05/11/2018   Depression    Dyslipidemia 04/08/2018   Exertional shortness of breath 05/11/2018   Family history of systemic lupus erythematosus (SLE) in mother 04/08/2018   Fibromyalgia    Former smoker 04/08/2018   GERD (gastroesophageal reflux disease)    History of cervical dysplasia 04/02/2016   2013; hysterectomy d/t recurrent dysplasia s/p LEEP   History of depression 03/16/2018   History of hyperlipidemia 03/16/2018   Hypercholesteremia 04/02/2016   Osteoarthritis    Primary osteoarthritis of both hands 04/08/2018   Sjogren's syndrome with keratoconjunctivitis sicca (HCC) 04/08/2018   +ANA, +Ro, +La, sicca symptoms, fatigue   Sleep apnea    Trochanteric bursitis, right hip 01/09/2018   Past Surgical History:  Procedure Laterality Date   CATARACT EXTRACTION, BILATERAL  07/2022   HYSTEROTOMY     KNEE SURGERY Right    LYMPH NODE BIOPSY     groin    NASAL SEPTUM SURGERY  12/2020   Patient Active Problem List   Diagnosis Date Noted   Unilateral primary osteoarthritis, left knee 05/23/2022   Acute lateral meniscus tear of left knee 05/07/2022   Pain in right hip 03/28/2022   Trochanteric bursitis, right hip  03/28/2022   CAD in native artery 06/26/2018   Sleep apnea 05/15/2018   Angina pectoris (HCC) 05/15/2018   SOB (shortness of breath) on exertion 05/11/2018   Chest pressure 05/11/2018   Fibromyalgia 05/11/2018   Sjogren's syndrome with keratoconjunctivitis sicca (HCC) 04/08/2018   Former smoker 04/08/2018   Family history of systemic lupus erythematosus (SLE) in mother 04/08/2018   Dyslipidemia 04/08/2018   Primary osteoarthritis of both hands 04/08/2018   History of depression 03/16/2018   History of hyperlipidemia 03/16/2018   Anxiety 04/02/2016   History of cervical dysplasia 04/02/2016   Hyperlipidemia 04/02/2016    PCP: Rex Castor, MD   REFERRING PROVIDER: Shauna Del, DO  REFERRING DIAG:  Diagnosis  M16.11 (ICD-10-CM) - Unilateral primary osteoarthritis, right hip  S76.011D (ICD-10-CM) - Tear of right gluteus minimus tendon, subsequent encounter  R29.898 (ICD-10-CM) - Weakness of right hip    THERAPY DIAG:  Pain in right hip  Difficulty in walking, not elsewhere classified  Muscle weakness (generalized)  Rationale for Evaluation and Treatment: Rehabilitation  ONSET DATE: several years ago   SUBJECTIVE:   SUBJECTIVE STATEMENT: [name] had an injection with her right hip with Dr. Vaughn Georges.  She notes progress since that time.     EVAL: This problem started out of nowhere. I do have OA, Dr. Vaughn Georges said that I have some  partially torn tendons on the outside. I limp when I walk, its also very painful when I first get up, getting into the car can be very painful, sleeping on right side can be difficult. This was first diagnosed as bursitis years ago, but recently they found the torn tendon on the MRI. After an injection I didn't have much pain at all, then started getting some groin pain after that. Limiting activity makes it not hurt, but then when I move again its worse, its a catch 22.  PERTINENT HISTORY: See above  PAIN:  Are you having pain? Yes: NPRS scale:  Rt gluteal and groin 80% better since cortisone shot Lt lower back 0-5/10 this week (6/10 after mowing) Pain location: Rt hip and left low back  Pain description: ache and sometimes stab with transfers Aggravating factors: getting up after prolonged sitting, getting in/out of car is improving, sleeping on right side   Relieving factors: extra strength tylenol, limiting activity   PRECAUTIONS: None  RED FLAGS: None   WEIGHT BEARING RESTRICTIONS: No  FALLS:  Has patient fallen in last 6 months? 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 Vaughn Georges on the 7th of July  OBJECTIVE:  Note: Objective measures were completed at Evaluation unless otherwise noted.  DIAGNOSTIC FINDINGS:   2 views of the right hip including AP pelvis and right hip lateral film  were ordered and reviewed by myself today.  X-rays demonstrate MR head  well-seated within the lung.  The superior lateral aspect of the joint is  relatively well-preserved but there is advanced arthritic change with near  bone-on-bone change over the inferior medial aspect of the joint with bony  sclerosis of the inferior acetabulum.  PATIENT SURVEYS:    Patient-Specific Activity Scoring Scheme  0 represents "unable to perform." 10 represents "able to perform at prior level. 0 1 2 3 4 5 6 7 8 9  10 (Date and Score)   Activity Eval   01/30/24 02/13/2024  1. Getting in car   5 5  5   2. Pain after sitting   6 5 5   3. Pain after standing for 15 minutes  5 7 7   4. Laying on right side  5 6 6   5.     Score 5.5 5.75 (adjusted scoring PRN as pt gave some answers on NPRS  instead of difficulty scale) 5.75    Total score = sum of the activity scores/number of activities Minimum detectable change (90%CI) for average score = 2 points Minimum detectable change (90%CI) for single activity score = 3 points   COGNITION: Overall cognitive status: Within functional limits for tasks assessed     SENSATION: Not tested    MUSCLE LENGTH:  HS mod limitation B Piriformis mild limitation B  02/13/2024: Lt/Rt in degrees Hip flexion: 100/85 Hamstrings: 45/40 Hip IR: 20/9 Hip ER: 33/26   PALPATION:  Tender lateral R thigh, sore R glutes and piriformis     LOWER EXTREMITY MMT:  MMT Right eval Left eval Right 01/30/24 Left 01/30/24 Left/Right 02/13/2024  Hip flexion 3 3+ 4 4 5-/Pain limited  Hip extension       Hip  abduction 3- pain  3 3- pain 3 4-/Pain limited  Hip adduction       Hip internal rotation       Hip external rotation       Knee flexion 4- 4 4- 4-   Knee extension 4- 4 4 4    Ankle dorsiflexion 5 5     Ankle plantarflexion       Ankle inversion       Ankle eversion        (Blank rows = not tested)  LOWER EXTREMITY SPECIAL TESTS:  Hip special tests: Portia Brittle (FABER) test: positive  and FADIR (+) in context of known advanced bone on bone in hip joint     GAIT: Distance walked: in clinic distances  Assistive device utilized: None Level of assistance: Complete Independence Comments: antalgic                                                                                                                                 TREATMENT DATE:  02/18/2024 Side lie clams 2 sets of 10 for 3 seconds with Black Thera-Band Bridging 10 x 5 seconds Modified Thomas stretch 4 x 20 seconds Prone alternating hip extensions 2 sets of 10 for 3 seconds Standing lumbar extension 10 x 3 seconds  Neuromuscular re-education: Single leg balance 4 x 10 seconds bilateral  Functional Activities: Hip hike at counter top 2 sets of 10 for 3 seconds Single Leg  Press 2 sets of 10 x each side with slow eccentrics 62# (1st set) and 68# (2nd set)  [id number]: Reviewed her appointment with Dr. Vaughn Georges after her cortisone shot Monday.  They will follow-up in 3 weeks to see about referral to an orthopedic surgeon.   02/13/2024 Side lie clams 2 sets of 10 for 3 seconds with Black Thera-Band Bridging 10 x 5 seconds Modified Thomas stretch 4 x 20 seconds  Neuromuscular re-education: Tandem balance head turning; eyes closed 2 x 20 seconds each  Functional Activities: Hip hike at counter top 2 sets of 10 for 3 seconds  [id number]: Reviewed examination findings from today, discussed referral back to Dr. Vaughn Georges to speak with him about possible additional imaging and whether or not referral to orthopedic surgeon might be appropriate.  Ice Right gluteals 5 minutes while discussing today's treatment and findings   02/11/2024 Nu Step Level 6 for 7 minutes Side lie clams 2 sets of 10 for 3 seconds with Black Thera-Band Bridging 10 x 5 seconds Modified Thomas stretch 4 x 20 seconds  Neuromuscular re-education: Tandem balance head turning; eyes closed 2 x 20 seconds each Single leg balance 3 x 10 seconds each side Wobble board side to side 2 x 1 minute  Functional Activities: Hip hike at counter top 2 sets of 10 for 3 seconds Single Leg Press 2 sets of 10 x each side with slow eccentrics 50# Step-down off 4 inch step 10 x each side with slow eccentrics  PATIENT EDUCATION:  Education details: exam findings, POC, HEP, ionto wear time/precautions  Person educated: Patient Education method: Explanation, Demonstration, and Handouts Education comprehension: verbalized understanding, returned demonstration, and needs further education  HOME EXERCISE PROGRAM: Access Code: [email or web address] URL: [email or web address] Date: 02/18/2024 Prepared by: Terral Ferrari  Exercises - Supine Bridge  - 1 x daily - 7 x weekly - 1-2 sets - 10 reps - 2 seconds  hold -  Supine Active Straight Leg Raise  - 1 x daily - 7 x weekly - 1-2 sets - 10 reps - Seated Knee Extension with Anchored Resistance  - 1 x daily - 7 x weekly - 1-2 sets - 10 reps - 1 second  hold - Seated Knee Flexion with Anchored Resistance  - 1 x daily - 7 x weekly - 1-2 sets - 10 reps - 1 second  hold - Clamshell  - 1 x daily - 7 x weekly - 2 sets - 10 reps - 3 seconds hold - Tandem Stance  - 1 x daily - 1 x weekly - 1 sets - 5 reps - 20 second hold - Standing Hip Hiking  - 3-5 x daily - 7 x weekly - 1 sets - 10 reps - 3 seconds hold - Modified Thomas Stretch  - 2 x daily - 7 x weekly - 5 reps - 20 seconds hold - Standing Lumbar Extension at [employer]  - 5 x daily - 7 x weekly - 1 sets - 5 reps - 3 seconds hold - Prone Hip Extension  - 1 x daily - 7 x weekly - 2-3 sets - 10 reps - 3 seconds hold - Single Leg Balance  - 1 x daily - 7 x weekly - 1 sets - 4 reps - 10 seconds hold  ASSESSMENT:  CLINICAL IMPRESSION: Kaitlyn Hudson notes significant progress with her right gluteal pain after a cortisone shot.  She is scheduled to follow-up with Dr. Vaughn Georges in 3 weeks to see about possible referral to an orthopedist and whether or not she is a THA candidate.  In the meantime, we will continue to focus on hip strength and function to allow and to meet long-term goals or prepare for THA.     EVAL: Patient is a 73 y.o. F who was seen today for physical therapy evaluation and treatment for  Diagnosis  M16.11 (ICD-10-CM) - Unilateral primary osteoarthritis, right hip  S76.011D (ICD-10-CM) - Tear of right gluteus minimus tendon, subsequent encounter  R29.898 (ICD-10-CM) - Weakness of right hip  . Objectives as above, she does seem to have been compensating for this injury for quite some time. We also tried some ionto to lateral R hip today to help reduce inflammation and improve exercise tolerance. Will make every effort to address subjective concerns and reduce pain/improve function moving forward.    OBJECTIVE IMPAIRMENTS: Abnormal gait, decreased mobility, difficulty walking, decreased ROM, decreased strength, increased fascial restrictions, increased muscle spasms, obesity, and pain.   ACTIVITY LIMITATIONS: sitting, standing, sleeping, stairs, transfers, and locomotion level  PARTICIPATION LIMITATIONS: meal prep, cleaning, laundry, driving, shopping, community activity, and yard work  PERSONAL FACTORS: Age, Behavior pattern, Education, Fitness, Past/current experiences, Social background, and Time since onset of injury/illness/exacerbation are also affecting patient's functional outcome.   REHAB POTENTIAL: Fair chronicity of pain/injury, sedentary lifestyle, body habitus, severe bone on bone R hip joint   CLINICAL DECISION MAKING: Evolving/moderate complexity  EVALUATION COMPLEXITY: Moderate   GOALS: Goals reviewed with patient? No  SHORT TERM GOALS: Target  date: 01/30/2024   Will be compliant with appropriate progressive HEP  Baseline: Goal status: Met 02/06/2024  2.  Pain to be no more than 6/10 at worst R hip  Baseline:  Goal status: Met 02/06/2024  3.  Gait pattern to have normalized  Baseline:  Goal status: ONGOING 02/18/2024  4.  General LE flexibility to be no more than 25% limited  Baseline:  Goal status: ONGOING 6/13825    LONG TERM GOALS: Target date: 02/27/2024    MMT to have improved by 1 grade all weak groups  Baseline:  Goal status: ONGOING 02/13/24  2.  Pain in R hip to be no more than 3/10 at worst with all functional tasks including laying on her R side and getting in/out of the car  Baseline:  Goal status: Ongoing 02/13/2024  3.  Pain with sitting to have resolved  Baseline:  Goal status: Partially met 02/13/2024, sitting is fine with first few steps after prolonged sitting being rough  4.  Will be able to stand/walk as desired for home care tasks and exercise as desired with R hip pain no more than 3/10 Baseline:  Goal status: Ongoing  02/18/2024  5.  PSFS to have improved by at least 2 points  Baseline:  Goal status: ONGOING 02/13/24     PLAN:  PT FREQUENCY: 1-2 visits per week  PT DURATION: Up to 2 weeks, depending on how her follow-up with Dr. Vaughn Georges goes next week  PLANNED INTERVENTIONS: 97750- Physical Performance Testing, 97110-Therapeutic exercises, 97530- Therapeutic activity, 97112- Neuromuscular re-education, 97535- Self Care, 16109- Manual therapy, [id number]- Gait training, [id number]- Aquatic Therapy, [phone]- Electrical stimulation (unattended), 97016- Vasopneumatic device, [id number]- Ultrasound, [id number]- Ionotophoresis 4mg /ml Dexamethasone , Taping, and Dry Needling  PLAN FOR NEXT SESSION: Sees Brooks again July 7.  Hip strength/stability for avoidance or preparation for THA.  Joli Neas, PT, MPT 02/18/24 4:13 PM   02/18/24 4:13 PM

## 2024-02-20 ENCOUNTER — Encounter: Admitting: Physical Therapy

## 2024-02-27 ENCOUNTER — Ambulatory Visit: Admitting: Physical Therapy

## 2024-02-27 ENCOUNTER — Encounter: Payer: Self-pay | Admitting: Physical Therapy

## 2024-02-27 DIAGNOSIS — M6281 Muscle weakness (generalized): Secondary | ICD-10-CM

## 2024-02-27 DIAGNOSIS — M25551 Pain in right hip: Secondary | ICD-10-CM

## 2024-02-27 DIAGNOSIS — R262 Difficulty in walking, not elsewhere classified: Secondary | ICD-10-CM

## 2024-02-27 NOTE — Therapy (Addendum)
 OUTPATIENT PHYSICAL THERAPY LOWER EXTREMITY TREATMENT NOTE  / DISCHARGE    Patient Name: Kaitlyn Hudson MRN: [id number] DOB:Sep 26, 1950, 73 y.o., female Today's Date: 02/27/2024  END OF SESSION:  PT End of Session - 02/27/24 1600     Visit Number 8    Number of Visits 17    Date for PT Re-Evaluation 02/27/24    Authorization Type HTA    Authorization Time Period 01/02/24 to 02/27/24    Progress Note Due on Visit 10    PT Start Time 1520    PT Stop Time 1558    PT Time Calculation (min) 38 min    Activity Tolerance Patient limited by pain    Behavior During Therapy [facility] for tasks assessed/performed             Past Medical History:  Diagnosis Date   Allergic rhinitis    Allergy    Anxiety 04/02/2016   Cataract    Chest pressure 05/11/2018   Depression    Dyslipidemia 04/08/2018   Exertional shortness of breath 05/11/2018   Family history of systemic lupus erythematosus (SLE) in mother 04/08/2018   Fibromyalgia    Former smoker 04/08/2018   GERD (gastroesophageal reflux disease)    History of cervical dysplasia 04/02/2016   2013; hysterectomy d/t recurrent dysplasia s/p LEEP   History of depression 03/16/2018   History of hyperlipidemia 03/16/2018   Hypercholesteremia 04/02/2016   Osteoarthritis    Primary osteoarthritis of both hands 04/08/2018   Sjogren's syndrome with keratoconjunctivitis sicca (HCC) 04/08/2018   +ANA, +Ro, +La, sicca symptoms, fatigue   Sleep apnea    Trochanteric bursitis, right hip 01/09/2018   Past Surgical History:  Procedure Laterality Date   CATARACT EXTRACTION, BILATERAL  07/2022   HYSTEROTOMY     KNEE SURGERY Right    LYMPH NODE BIOPSY     groin    NASAL SEPTUM SURGERY  12/2020   Patient Active Problem List   Diagnosis Date Noted   Unilateral primary osteoarthritis, left knee 05/23/2022   Acute lateral meniscus tear of left knee 05/07/2022   Pain in right hip 03/28/2022   Trochanteric bursitis, right hip 03/28/2022   CAD in  native artery 06/26/2018   Sleep apnea 05/15/2018   Angina pectoris (HCC) 05/15/2018   SOB (shortness of breath) on exertion 05/11/2018   Chest pressure 05/11/2018   Fibromyalgia 05/11/2018   Sjogren's syndrome with keratoconjunctivitis sicca (HCC) 04/08/2018   Former smoker 04/08/2018   Family history of systemic lupus erythematosus (SLE) in mother 04/08/2018   Dyslipidemia 04/08/2018   Primary osteoarthritis of both hands 04/08/2018   History of depression 03/16/2018   History of hyperlipidemia 03/16/2018   Anxiety 04/02/2016   History of cervical dysplasia 04/02/2016   Hyperlipidemia 04/02/2016    PCP: Sabas Rush, MD   REFERRING PROVIDER: Burnetta Brunet, DO  REFERRING DIAG:  Diagnosis  M16.11 (ICD-10-CM) - Unilateral primary osteoarthritis, right hip  S76.011D (ICD-10-CM) - Tear of right gluteus minimus tendon, subsequent encounter  R29.898 (ICD-10-CM) - Weakness of right hip    THERAPY DIAG:  Pain in right hip  Difficulty in walking, not elsewhere classified  Muscle weakness (generalized)  Rationale for Evaluation and Treatment: Rehabilitation  ONSET DATE: several years ago   SUBJECTIVE:   SUBJECTIVE STATEMENT:  I am really on the struggle bus today, got the cortisone shot 2 weeks ago on Monday and that helped a lot. A few days before I got the cortisone shot I was standing and walking a  lot and my back really started hurting me. Back is still really bad, some days it feels OK, some days I can barely get up. Pain is not shooting down my leg, its dead center in my back and above the L hip in my back. Have had back issues off and on for years and that goes away in a couple of days but this is just not going away.    EVAL: This problem started out of nowhere. I do have OA, Dr. Burnetta said that I have some partially torn tendons on the outside. I limp when I walk, its also very painful when I first get up, getting into the car can be very painful, sleeping on right  side can be difficult. This was first diagnosed as bursitis years ago, but recently they found the torn tendon on the MRI. After an injection I didn't have much pain at all, then started getting some groin pain after that. Limiting activity makes it not hurt, but then when I move again its worse, its a catch 22.  PERTINENT HISTORY: See above  PAIN:  Are you having pain? Yes: NPRS scale: 1-2/10 now, can get to 8/10 at worst in the back; hip 3/10 when walking  Pain location: Rt hip and left low back  Pain description: back- mild ache sometimes, like a knife other times, knees just give out; hip is OK sitting right now  Aggravating factors: getting up after prolonged sitting, getting in/out of car is improving, sleeping on right side but better since the injection   Relieving factors: extra strength tylenol, limiting activity, sitting with feet up and not moving    PRECAUTIONS: None  RED FLAGS: None   WEIGHT BEARING RESTRICTIONS: No  FALLS:  Has patient fallen in last 6 months? 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 Burnetta on the 7th of July  OBJECTIVE:  Note: Objective measures were completed at Evaluation unless otherwise noted.  DIAGNOSTIC FINDINGS:   2 views of the right hip including AP pelvis and right hip lateral film  were ordered and reviewed by myself today.  X-rays demonstrate MR head  well-seated within the lung.  The superior lateral aspect of the joint is  relatively well-preserved but there is advanced arthritic change with near  bone-on-bone change over the inferior medial aspect of the joint with bony   sclerosis of the inferior acetabulum.  PATIENT SURVEYS:    Patient-Specific Activity Scoring Scheme  0 represents "unable to perform." 10 represents "able to perform at prior level. 0 1 2 3 4 5 6 7 8 9  10 (Date and Score)   Activity Eval   01/30/24 02/13/2024 02/27/24  1. Getting in car   5 5  5 8   2. Pain after sitting   6 5 5 5   3. Pain after standing for 15 minutes  5 7 7  0  4. Laying on right side  5 6 6 9   5.      Score 5.5 5.75 (adjusted scoring PRN as pt gave some answers on NPRS instead of difficulty scale) 5.75  5.5   Total score = sum of the activity  scores/number of activities Minimum detectable change (90%CI) for average score = 2 points Minimum detectable change (90%CI) for single activity score = 3 points   COGNITION: Overall cognitive status: Within functional limits for tasks assessed     SENSATION: Not tested    MUSCLE LENGTH:  HS mod limitation B; 02/27/24- L mod limitation R OK  Piriformis mild limitation B; 02/27/24- WNL B  02/13/2024: Lt/Rt in degrees Hip flexion: 100/85 Hamstrings: 45/40 Hip IR: 20/9 Hip ER: 33/26   PALPATION:  Tender lateral R thigh, sore R glutes and piriformis     LOWER EXTREMITY MMT:  MMT Right eval Left eval Right 01/30/24 Left 01/30/24 Left/Right 02/13/2024 Right 02/27/24 Left 02/27/24  Hip flexion 3 3+ 4 4 5-/Pain limited 4+ 4+  Hip extension         Hip abduction 3- pain  3 3- pain 3 4-/Pain limited 3+ 3  Hip adduction         Hip internal rotation         Hip external rotation         Knee flexion 4- 4 4- 4-  4 4  Knee extension 4- 4 4 4  4 4   Ankle dorsiflexion 5 5       Ankle plantarflexion         Ankle inversion         Ankle eversion          (Blank rows = not tested)  LOWER EXTREMITY SPECIAL TESTS:  Hip special tests: Belvie (FABER) test: positive  and FADIR (+) in context of known advanced bone on bone in hip joint     GAIT: Distance walked: in clinic distances  Assistive device utilized:  None Level of assistance: Complete Independence Comments: antalgic                                                                                                                                 TREATMENT DATE:   02/27/24  PSFS, MMT, discussed goals and progress with PT, POC moving forward and possibility that intense back pain could in part be referred from ongoing hip issues    SKTC 5x5 seconds B  Tried multiple versions of glute/piriformis stretches with increased groin pain- deferred  PPT 10x3 second holds Hip flexor stretch 2x30 seconds Lumbar arching supine x10  Seated trunk extensions x10 with UEs [place] QL stretch 3x30 seconds forward       02/18/2024 Side lie clams 2 sets of 10 for 3 seconds with Black Thera-Band Bridging 10 x 5 seconds Modified Thomas stretch 4 x 20 seconds Prone alternating hip extensions 2 sets of 10 for 3 seconds Standing lumbar extension 10 x 3 seconds  Neuromuscular re-education: Single leg balance 4 x 10 seconds bilateral  Functional Activities: Hip hike at counter top 2 sets of 10 for 3 seconds Single Leg Press 2 sets of 10 x each side with slow eccentrics 62# (  1st set) and 68# (2nd set)  [phone]: Reviewed her appointment with Dr. Burnetta after her cortisone shot Monday.  They will follow-up in 3 weeks to see about referral to an orthopedic surgeon.   02/13/2024 Side lie clams 2 sets of 10 for 3 seconds with Black Thera-Band Bridging 10 x 5 seconds Modified Thomas stretch 4 x 20 seconds  Neuromuscular re-education: Tandem balance head turning; eyes closed 2 x 20 seconds each  Functional Activities: Hip hike at counter top 2 sets of 10 for 3 seconds  [id number]: Reviewed examination findings from today, discussed referral back to Dr. Burnetta to speak with him about possible additional imaging and whether or not referral to orthopedic surgeon might be appropriate.  Ice Right gluteals 5 minutes while discussing today's treatment and  findings   02/11/2024 Nu Step Level 6 for 7 minutes Side lie clams 2 sets of 10 for 3 seconds with Black Thera-Band Bridging 10 x 5 seconds Modified Thomas stretch 4 x 20 seconds  Neuromuscular re-education: Tandem balance head turning; eyes closed 2 x 20 seconds each Single leg balance 3 x 10 seconds each side Wobble board side to side 2 x 1 minute  Functional Activities: Hip hike at counter top 2 sets of 10 for 3 seconds Single Leg Press 2 sets of 10 x each side with slow eccentrics 50# Step-down off 4 inch step 10 x each side with slow eccentrics  PATIENT EDUCATION:  Education details: exam findings, POC, HEP, ionto wear time/precautions  Person educated: Patient Education method: Programmer, multimedia, Demonstration, and Handouts Education comprehension: verbalized understanding, returned demonstration, and needs further education  HOME EXERCISE PROGRAM: Access Code: [email or web address] URL: [email or web address] Date: 02/18/2024 Prepared by: Lamar Ivory  Exercises - Supine Bridge  - 1 x daily - 7 x weekly - 1-2 sets - 10 reps - 2 seconds  hold - Supine Active Straight Leg Raise  - 1 x daily - 7 x weekly - 1-2 sets - 10 reps - Seated Knee Extension with Anchored Resistance  - 1 x daily - 7 x weekly - 1-2 sets - 10 reps - 1 second  hold - Seated Knee Flexion with Anchored Resistance  - 1 x daily - 7 x weekly - 1-2 sets - 10 reps - 1 second  hold - Clamshell  - 1 x daily - 7 x weekly - 2 sets - 10 reps - 3 seconds hold - Tandem Stance  - 1 x daily - 1 x weekly - 1 sets - 5 reps - 20 second hold - Standing Hip Hiking  - 3-5 x daily - 7 x weekly - 1 sets - 10 reps - 3 seconds hold - Modified Thomas Stretch  - 2 x daily - 7 x weekly - 5 reps - 20 seconds hold - Standing Lumbar Extension at [employer]  - 5 x daily - 7 x weekly - 1 sets - 5 reps - 3 seconds hold - Prone Hip Extension  - 1 x daily - 7 x weekly - 2-3 sets - 10 reps - 3 seconds hold - Single Leg Balance  - 1 x  daily - 7 x weekly - 1 sets - 4 reps - 10 seconds hold  ASSESSMENT:  CLINICAL IMPRESSION:  Not feeling great today, back has really been acting up and she has started having return of deep groin pain as well. Updated all objectives and goals, at this point she would like to see Dr. Burnetta again before continuing with PT-  I think this is reasonable. Does seem to present with an extension based preference for lumbar spine today, encouraged this movement at home. Did not DC today- put her on hold in case her and Dr. Burnetta agree it is in her best interest to continue PT prior to any potential surgical procedures.       EVAL: Patient is a 73 y.o. F who was seen today for physical therapy evaluation and treatment for  Diagnosis  M16.11 (ICD-10-CM) - Unilateral primary osteoarthritis, right hip  S76.011D (ICD-10-CM) - Tear of right gluteus minimus tendon, subsequent encounter  R29.898 (ICD-10-CM) - Weakness of right hip  . Objectives as above, she does seem to have been compensating for this injury for quite some time. We also tried some ionto to lateral R hip today to help reduce inflammation and improve exercise tolerance. Will make every effort to address subjective concerns and reduce pain/improve function moving forward.   OBJECTIVE IMPAIRMENTS: Abnormal gait, decreased mobility, difficulty walking, decreased ROM, decreased strength, increased fascial restrictions, increased muscle spasms, obesity, and pain.   ACTIVITY LIMITATIONS: sitting, standing, sleeping, stairs, transfers, and locomotion level  PARTICIPATION LIMITATIONS: meal prep, cleaning, laundry, driving, shopping, community activity, and yard work  PERSONAL FACTORS: Age, Behavior pattern, Education, Fitness, Past/current experiences, Social background, and Time since onset of injury/illness/exacerbation are also affecting patient's functional outcome.   REHAB POTENTIAL: Fair chronicity of pain/injury, sedentary lifestyle, body  habitus, severe bone on bone R hip joint   CLINICAL DECISION MAKING: Evolving/moderate complexity  EVALUATION COMPLEXITY: Moderate   GOALS: Goals reviewed with patient? No  SHORT TERM GOALS: Target date: 01/30/2024   Will be compliant with appropriate progressive HEP  Baseline: Goal status: Met 02/06/2024  2.  Pain to be no more than 6/10 at worst R hip  Baseline:  Goal status: Met 02/06/2024  3.  Gait pattern to have normalized  Baseline:  Goal status: ONGOING 02/27/24 limited by back pain   4.  General LE flexibility to be no more than 25% limited  Baseline:  Goal status: PARTIALLY MET 02/27/24    LONG TERM GOALS: Target date: 02/27/2024    MMT to have improved by 1 grade all weak groups  Baseline:  Goal status: ONGOING 02/27/24  2.  Pain in R hip to be no more than 3/10 at worst with all functional tasks including laying on her R side and getting in/out of the car  Baseline:  Goal status: Ongoing 02/27/24- was getting there until the last couple days, now hip is more flared up again   3.  Pain with sitting to have resolved  Baseline:  Goal status:  ongoing 02/27/24  4.  Will be able to stand/walk as desired for home care tasks and exercise as desired with R hip pain no more than 3/10 Baseline:  Goal status: Ongoing 02/27/2024  5.  PSFS to have improved by at least 2 points  Baseline:  Goal status: ONGOING 02/27/24  6. Back pain to have returned to baseline Baseline: no lumbar pain at baseline Goal status: NEW 02/27/24     PLAN:  PT FREQUENCY: 1-2 visits per week  PT DURATION: Up to 2 weeks, depending on how her follow-up with Dr. Burnetta goes next week  PLANNED INTERVENTIONS: 97750- Physical Performance Testing, 97110-Therapeutic exercises, 97530- Therapeutic activity, 97112- Neuromuscular re-education, 97535- Self Care, 02859- Manual therapy, [id number]- Gait training, [id number]- Aquatic Therapy, [id number]- Electrical stimulation (unattended), 97016- Vasopneumatic  device, [id number]- Ultrasound, [id number]- Ionotophoresis 4mg /ml Dexamethasone , Taping, and Dry Needling  PLAN FOR NEXT SESSION: Judyann Sprang again July 7.  Hip strength/stability for avoidance or preparation for THA. Going on hold until after she sees Parshall on the 7th per her request. Will need a recert if she wants to continue PT vs DC at that point/ depending on what he says (but objectives/goals updated last visit so should be easy to fire off)    Josette Rough, PT, DPT 02/27/24 4:00 PM    PHYSICAL THERAPY DISCHARGE SUMMARY  Visits from Start of Care: 8  Current functional level related to goals / functional outcomes: See note   Remaining deficits: See note   Education / Equipment: HEP  Patient goals were partially met. Patient is being discharged due to not returning since the last visit.  Ozell Silvan, PT, DPT, OCS, ATC 05/20/24  11:34 AM

## 2024-03-08 ENCOUNTER — Encounter: Payer: Self-pay | Admitting: Sports Medicine

## 2024-03-08 ENCOUNTER — Ambulatory Visit: Admitting: Sports Medicine

## 2024-03-08 DIAGNOSIS — M1611 Unilateral primary osteoarthritis, right hip: Secondary | ICD-10-CM | POA: Diagnosis not present

## 2024-03-08 DIAGNOSIS — M7061 Trochanteric bursitis, right hip: Secondary | ICD-10-CM

## 2024-03-08 DIAGNOSIS — M5136 Other intervertebral disc degeneration, lumbar region with discogenic back pain only: Secondary | ICD-10-CM

## 2024-03-08 NOTE — Progress Notes (Signed)
 Patient says that her back is overall better with the Prednisone, the exercises, and one day using a back brace that she had previously used. She says that on a scale, it is about 90% improved overall. Patient does still have hip pain. Her lateral hip pain did improve some with the injection, but the deeper hip pain has not improved any.

## 2024-03-08 NOTE — Progress Notes (Signed)
 Kaitlyn Hudson - 73 y.o. female MRN [id number]  Date of birth: July 26, 1951  Office Visit Note: Visit Date: 03/08/2024 PCP: Sabas Norleen PARAS., MD Referred by: Sabas Norleen PARAS., MD  Subjective: Chief Complaint  Patient presents with   Right Hip - Follow-up   Lower Back - Follow-up   HPI: Kaitlyn Hudson is a pleasant 73 y.o. female who presents today for follow-up of chronic right hip pain as well as low back pain.  Low back -this is doing much better, feels like she is at least 90% improved.  The oral prednisone did help this quite a bit.  She is still doing some PT for this in the hip but is not finding significant benefit from the hip in terms of PT.  Right hip -her lateral hip pain is better after the injection but she is still having quite significant pain deep within the hip and the groin.  We did perform injection back in the hip in March of this past year which gave her very good relief but only for a few weeks.  The pain is bothering her where it is affecting her ADLs, she has pain with even longer car rides at this point.  She is using Tylenol as needed.  Pertinent ROS were reviewed with the patient and found to be negative unless otherwise specified above in HPI.   Assessment & Plan: Visit Diagnoses:  1. Unilateral primary osteoarthritis, right hip   2. Trochanteric bursitis, right hip   3. Degeneration of intervertebral disc of lumbar region with discogenic back pain    Plan: Impression is chronic right hip pain with both hip osteoarthritis as well as reciprocal trochanteric bursitis.  Her lateral hip pain has improved with the injection, however she is still dealing with pain emanating from the hip joint from her advanced inferior-medial osteoarthritis.  At this point, previous injections have provided good but only temporary relief and this is affecting her ADLs.  I would like her to make an appointment with Dr. Vernetta for discussion on right hip total arthroplasty.  She is  leaving for a trip 1 August, we discussed considering an injection prior to this for analgesic control, would need to be at least 2 months prior to any hip procedure that would be performed.  She is understanding.  In terms of her low back pain with previous disc bulge, this is doing much better.  She has completed her prednisone course and will discontinue this.  For her Sjogren syndrome and rheumatologic joint pain, she will continue Plaquenil  200 mg Monday through Friday.  Follow-up: Make appt with Dr. Vernetta for discussion on R-hip replacement   Meds & Orders: No orders of the defined types were placed in this encounter.  No orders of the defined types were placed in this encounter.    Procedures: No procedures performed      Clinical History: No specialty comments available.  She reports that she quit smoking about 13 years ago. Her smoking use included cigarettes. She started smoking about 43 years ago. She has a 15 pack-year smoking history. She has never been exposed to tobacco smoke. She has never used smokeless tobacco. No results for input(s): HGBA1C, LABURIC in the last 8760 hours.  Objective:   Vital Signs:   Physical Exam  Gen: Well-appearing, in no acute distress; non-toxic CV: Well-perfused. Warm.  Resp: Breathing unlabored on room air; no wheezing. Psych: Fluid speech in conversation; appropriate affect; normal thought process  Ortho Exam -  Right hip: There is no TTP over the greater trochanteric region.  There is limited internal > external logroll with associated pain.  Positive FADIR, positive Stinchfield test.  Imaging:  11/11/23: 2 views of the right hip including AP pelvis and right hip lateral film  were ordered and reviewed by myself today.  X-rays demonstrate MR head  well-seated within the lung.  The superior lateral aspect of the joint is  relatively well-preserved but there is advanced arthritic change with near  bone-on-bone change over the  inferior medial aspect of the joint with bony  sclerosis of the inferior acetabulum.   Past Medical/Family/Surgical/Social History: Medications & Allergies reviewed per EMR, new medications updated. Patient Active Problem List   Diagnosis Date Noted   Unilateral primary osteoarthritis, left knee 05/23/2022   Acute lateral meniscus tear of left knee 05/07/2022   Pain in right hip 03/28/2022   Trochanteric bursitis, right hip 03/28/2022   CAD in native artery 06/26/2018   Sleep apnea 05/15/2018   Angina pectoris (HCC) 05/15/2018   SOB (shortness of breath) on exertion 05/11/2018   Chest pressure 05/11/2018   Fibromyalgia 05/11/2018   Sjogren's syndrome with keratoconjunctivitis sicca (HCC) 04/08/2018   Former smoker 04/08/2018   Family history of systemic lupus erythematosus (SLE) in mother 04/08/2018   Dyslipidemia 04/08/2018   Primary osteoarthritis of both hands 04/08/2018   History of depression 03/16/2018   History of hyperlipidemia 03/16/2018   Anxiety 04/02/2016   History of cervical dysplasia 04/02/2016   Hyperlipidemia 04/02/2016   Past Medical History:  Diagnosis Date   Allergic rhinitis    Allergy    Anxiety 04/02/2016   Cataract    Chest pressure 05/11/2018   Depression    Dyslipidemia 04/08/2018   Exertional shortness of breath 05/11/2018   Family history of systemic lupus erythematosus (SLE) in mother 04/08/2018   Fibromyalgia    Former smoker 04/08/2018   GERD (gastroesophageal reflux disease)    History of cervical dysplasia 04/02/2016   2013; hysterectomy d/t recurrent dysplasia s/p LEEP   History of depression 03/16/2018   History of hyperlipidemia 03/16/2018   Hypercholesteremia 04/02/2016   Osteoarthritis    Primary osteoarthritis of both hands 04/08/2018   Sjogren's syndrome with keratoconjunctivitis sicca (HCC) 04/08/2018   +ANA, +Ro, +La, sicca symptoms, fatigue   Sleep apnea    Trochanteric bursitis, right hip 01/09/2018   Family History   Problem Relation Age of Onset   Lupus Mother    Osteoarthritis Mother    Polymyalgia rheumatica Mother    Osteoarthritis Father    Alzheimer's disease Father    Breast cancer Maternal Grandmother    Colon cancer Neg Hx    Esophageal cancer Neg Hx    Rectal cancer Neg Hx    Stomach cancer Neg Hx    Past Surgical History:  Procedure Laterality Date   CATARACT EXTRACTION, BILATERAL  07/2022   HYSTEROTOMY     KNEE SURGERY Right    LYMPH NODE BIOPSY     groin    NASAL SEPTUM SURGERY  12/2020   Social History   Occupational History   Not on file  Tobacco Use   Smoking status: Former    Current packs/day: 0.00    Average packs/day: 0.5 packs/day for 30.0 years (15.0 ttl pk-yrs)    Types: Cigarettes    Start date: 09/02/1980    Quit date: 09/02/2010    Years since quitting: 13.5    Passive exposure: Never   Smokeless tobacco: Never  Vaping Use   Vaping status: Never Used  Substance and Sexual Activity   Alcohol use: No   Drug use: Never   Sexual activity: Not on file

## 2024-03-10 ENCOUNTER — Encounter: Payer: Self-pay | Admitting: Rehabilitative and Restorative Service Providers"

## 2024-03-19 ENCOUNTER — Encounter: Admitting: Rehabilitative and Restorative Service Providers"

## 2024-03-22 ENCOUNTER — Ambulatory Visit: Admitting: Sports Medicine

## 2024-03-22 ENCOUNTER — Other Ambulatory Visit: Payer: Self-pay

## 2024-03-22 ENCOUNTER — Encounter: Payer: Self-pay | Admitting: Sports Medicine

## 2024-03-22 DIAGNOSIS — M25551 Pain in right hip: Secondary | ICD-10-CM

## 2024-03-22 DIAGNOSIS — M1611 Unilateral primary osteoarthritis, right hip: Secondary | ICD-10-CM

## 2024-03-22 MED ORDER — LIDOCAINE HCL 1 % IJ SOLN
4.0000 mL | INTRAMUSCULAR | Status: AC | PRN
Start: 2024-03-22 — End: 2024-03-22
  Administered 2024-03-22: 4 mL

## 2024-03-22 MED ORDER — METHYLPREDNISOLONE ACETATE 40 MG/ML IJ SUSP
80.0000 mg | INTRAMUSCULAR | Status: AC | PRN
  Administered 2024-03-22: 80 mg via INTRA_ARTICULAR

## 2024-03-22 NOTE — Progress Notes (Signed)
 Kaitlyn Hudson - 72 y.o. female MRN [id number]  Date of birth: 01/26/1951  Office Visit Note: Visit Date: 03/22/2024 PCP: Kaitlyn Norleen PARAS., MD Referred by: Kaitlyn Norleen PARAS., MD  Subjective: Chief Complaint  Patient presents with   Right Hip - Pain   HPI: Kaitlyn Hudson is a pleasant 73 y.o. female who presents today for follow-up of chronic right hip pain with advanced OA.  Right hip is continuing to be bothersome.  She feels its deep within the hip in the groin.  Now is having pain radiating to the posterior buttock and hip as well.  She is using Tylenol as needed.  She is leaving for a trip to [facility] on August 2, would like to be feeling well for this.  Per my recommendation, she did make an appointment with Dr. Vernetta to have a discussion regarding hip replacement and whether he feels she is a good candidate for this.  Has this upcoming on 8/14.  Pertinent ROS were reviewed with the patient and found to be negative unless otherwise specified above in HPI.   Assessment & Plan: Visit Diagnoses:  1. Unilateral primary osteoarthritis, right hip   2. Pain in right hip    Plan: Impression is advanced right hip osteoarthritis which is interfering with ADLs.  Previous x-rays show near bone-on-bone arthritic change in the lateral film.  Through shared decision-making, did proceed with ultrasound-guided intra-articular injection, patient tolerated well.  Given the severity of her OA, I would like her to follow-up with Dr. Vernetta, she has an appointment upcoming on 04/15/2024 to reevaluate how she is doing status postinjection and decide whether or not he feels a hip replacement is warranted or needed.  I am happy to see and back as needed following this.  We discussed the nature of infrequent injections.  May use ice/heat as well as Tylenol for any postinjection pain.  Follow-up: Return for f/u with Dr. Vernetta.   Meds & Orders: No orders of the defined types were placed in this  encounter.   Orders Placed This Encounter  Procedures   Large Joint Inj   US  Guided Needle Placement - No Linked Charges     Procedures: Large Joint Inj: R hip joint on 03/22/2024 4:55 PM Indications: pain Details: 22 G 3.5 in needle, ultrasound-guided anterior approach Medications: 4 mL lidocaine  1 %; 80 mg methylPREDNISolone  acetate 40 MG/ML Outcome: tolerated well, no immediate complications  Procedure: US -guided intra-articular hip injection, Right After discussion on risks/benefits/indications and informed verbal consent was obtained, a timeout was performed. Patient was lying supine on exam table. The hip was cleaned with betadine and alcohol swabs. Then utilizing ultrasound guidance, the patient's femoral head and neck junction was identified and subsequently injected with 4:2 lidocaine :depomedrol via an in-plane approach with ultrasound visualization of the injectate administered into the hip joint. Patient tolerated procedure well without immediate complications.  Procedure, treatment alternatives, risks and benefits explained, specific risks discussed. Consent was given by the patient. Immediately prior to procedure a time out was called to verify the correct patient, procedure, equipment, support staff and site/side marked as required. Patient was prepped and draped in the usual sterile fashion.          Clinical History: No specialty comments available.  She reports that she quit smoking about 13 years ago. Her smoking use included cigarettes. She started smoking about 43 years ago. She has a 15 pack-year smoking history. She has never been exposed to tobacco smoke. She  has never used smokeless tobacco. No results for input(s): HGBA1C, LABURIC in the last 8760 hours.  Objective:    Physical Exam  Gen: Well-appearing, in no acute distress; non-toxic CV: Well-perfused. Warm.  Resp: Breathing unlabored on room air; no wheezing. Psych: Fluid speech in conversation;  appropriate affect; normal thought process  Ortho Exam - Right hip: There is no redness swelling or effusion of the right hip.  There is restriction with internal logroll.  Positive FADIR testing.  Pain with hip flexion and Stinchfield testing.  Imaging:  11/11/23:  2 views of the right hip including AP pelvis and right hip lateral film  were ordered and reviewed by myself today.  X-rays demonstrate MR head  well-seated within the lung.  The superior lateral aspect of the joint is  relatively well-preserved but there is advanced arthritic change with near  bone-on-bone change over the inferior medial aspect of the joint with bony  sclerosis of the inferior acetabulum.   Past Medical/Family/Surgical/Social History: Medications & Allergies reviewed per EMR, new medications updated. Patient Active Problem List   Diagnosis Date Noted   Unilateral primary osteoarthritis, left knee 05/23/2022   Acute lateral meniscus tear of left knee 05/07/2022   Pain in right hip 03/28/2022   Trochanteric bursitis, right hip 03/28/2022   CAD in native artery 06/26/2018   Sleep apnea 05/15/2018   Angina pectoris (HCC) 05/15/2018   SOB (shortness of breath) on exertion 05/11/2018   Chest pressure 05/11/2018   Fibromyalgia 05/11/2018   Sjogren's syndrome with keratoconjunctivitis sicca (HCC) 04/08/2018   Former smoker 04/08/2018   Family history of systemic lupus erythematosus (SLE) in mother 04/08/2018   Dyslipidemia 04/08/2018   Primary osteoarthritis of both hands 04/08/2018   History of depression 03/16/2018   History of hyperlipidemia 03/16/2018   Anxiety 04/02/2016   History of cervical dysplasia 04/02/2016   Hyperlipidemia 04/02/2016   Past Medical History:  Diagnosis Date   Allergic rhinitis    Allergy    Anxiety 04/02/2016   Cataract    Chest pressure 05/11/2018   Depression    Dyslipidemia 04/08/2018   Exertional shortness of breath 05/11/2018   Family history of systemic lupus  erythematosus (SLE) in mother 04/08/2018   Fibromyalgia    Former smoker 04/08/2018   GERD (gastroesophageal reflux disease)    History of cervical dysplasia 04/02/2016   2013; hysterectomy d/t recurrent dysplasia s/p LEEP   History of depression 03/16/2018   History of hyperlipidemia 03/16/2018   Hypercholesteremia 04/02/2016   Osteoarthritis    Primary osteoarthritis of both hands 04/08/2018   Sjogren's syndrome with keratoconjunctivitis sicca (HCC) 04/08/2018   +ANA, +Ro, +La, sicca symptoms, fatigue   Sleep apnea    Trochanteric bursitis, right hip 01/09/2018   Family History  Problem Relation Age of Onset   Lupus Mother    Osteoarthritis Mother    Polymyalgia rheumatica Mother    Osteoarthritis Father    Alzheimer's disease Father    Breast cancer Maternal Grandmother    Colon cancer Neg Hx    Esophageal cancer Neg Hx    Rectal cancer Neg Hx    Stomach cancer Neg Hx    Past Surgical History:  Procedure Laterality Date   CATARACT EXTRACTION, BILATERAL  07/2022   HYSTEROTOMY     KNEE SURGERY Right    LYMPH NODE BIOPSY     groin    NASAL SEPTUM SURGERY  12/2020   Social History   Occupational History   Not on file  Tobacco Use   Smoking status: Former    Current packs/day: 0.00    Average packs/day: 0.5 packs/day for 30.0 years (15.0 ttl pk-yrs)    Types: Cigarettes    Start date: 09/02/1980    Quit date: 09/02/2010    Years since quitting: 13.5    Passive exposure: Never   Smokeless tobacco: Never  Vaping Use   Vaping status: Never Used  Substance and Sexual Activity   Alcohol use: No   Drug use: Never   Sexual activity: Not on file

## 2024-03-24 ENCOUNTER — Encounter: Admitting: Rehabilitative and Restorative Service Providers"

## 2024-03-26 ENCOUNTER — Encounter: Admitting: Rehabilitative and Restorative Service Providers"

## 2024-04-12 ENCOUNTER — Encounter: Admitting: Orthopaedic Surgery

## 2024-04-12 NOTE — Progress Notes (Signed)
 Office Visit Note  Patient: Kaitlyn Hudson             Date of Birth: [birth year]           MRN: [id number]             PCP: Sabas Norleen PARAS., MD Referring: Sabas Norleen PARAS., MD Visit Date: 04/26/2024 Occupation: @GUAROCC @  Subjective:    History of Present Illness: Kaitlyn Hudson is a 73 y.o. female with history of sjogren's syndrome and osteoarthritis.  Patient remains on  Plaquenil  200 mg 1 tablet by mouth twice daily monday through friday.  She is tolerating Plaquenil  without any side effects and has not had any recent gaps in therapy.  She continues to have chronic sicca symptoms especially dry eyes.  She has been using Restasis and Systane for symptomatic relief. She continues to see ophthalmology on a regular basis and the dentist every 6 months.  She denies any oral or nasal ulcerations.  She denies any symptoms of Raynaud's phenomenon.  Patient continues to have hair thinning and has been using a wig. Patient continues to experience chronic pain in the right hip.  She has been getting injections in the hip joint and trochanteric bursa on the right side every 3 months.  Patient is considering proceeding with a right hip replacement in January/February 2026.  Patient states she has had intermittent discomfort in the right knee but is not ready for a right knee replacement.  She has intermittent aching in both hands but denies any joint swelling. She denies any new medical conditions.  Activities of Daily Living:  Patient reports morning stiffness for 1 hour.   Patient Reports nocturnal pain.  Difficulty dressing/grooming: Denies Difficulty climbing stairs: Denies Difficulty getting out of chair: Denies Difficulty using hands for taps, buttons, cutlery, and/or writing: Reports  Review of Systems  Constitutional:  Positive for fatigue.  HENT:  Positive for mouth dryness. Negative for mouth sores.   Eyes:  Positive for dryness.  Respiratory:  Negative for shortness of breath.    Cardiovascular:  Negative for chest pain and palpitations.  Gastrointestinal:  Negative for blood in stool, constipation and diarrhea.  Endocrine: Negative for increased urination.  Genitourinary:  Negative for involuntary urination.  Musculoskeletal:  Positive for joint pain, joint pain, myalgias, morning stiffness, muscle tenderness and myalgias. Negative for gait problem, joint swelling and muscle weakness.  Skin:  Positive for color change, hair loss and sensitivity to sunlight. Negative for rash.  Allergic/Immunologic: Negative for susceptible to infections.  Neurological:  Negative for dizziness and headaches.  Hematological:  Negative for swollen glands.  Psychiatric/Behavioral:  Negative for depressed mood and sleep disturbance. The patient is not nervous/anxious.     PMFS History:  Patient Active Problem List   Diagnosis Date Noted   Unilateral primary osteoarthritis, left knee 05/23/2022   Acute lateral meniscus tear of left knee 05/07/2022   Pain in right hip 03/28/2022   Trochanteric bursitis, right hip 03/28/2022   CAD in native artery 06/26/2018   Sleep apnea 05/15/2018   Angina pectoris (HCC) 05/15/2018   SOB (shortness of breath) on exertion 05/11/2018   Chest pressure 05/11/2018   Fibromyalgia 05/11/2018   Sjogren's syndrome with keratoconjunctivitis sicca (HCC) 04/08/2018   Former smoker 04/08/2018   Family history of systemic lupus erythematosus (SLE) in mother 04/08/2018   Dyslipidemia 04/08/2018   Primary osteoarthritis of both hands 04/08/2018   History of depression 03/16/2018   History of hyperlipidemia 03/16/2018  Anxiety 04/02/2016   History of cervical dysplasia 04/02/2016   Hyperlipidemia 04/02/2016    Past Medical History:  Diagnosis Date   Allergic rhinitis    Allergy    Anxiety 04/02/2016   Cataract    Chest pressure 05/11/2018   Depression    Dyslipidemia 04/08/2018   Exertional shortness of breath 05/11/2018   Family history of  systemic lupus erythematosus (SLE) in mother 04/08/2018   Fibromyalgia    Former smoker 04/08/2018   GERD (gastroesophageal reflux disease)    History of cervical dysplasia 04/02/2016   2013; hysterectomy d/t recurrent dysplasia s/p LEEP   History of depression 03/16/2018   History of hyperlipidemia 03/16/2018   Hypercholesteremia 04/02/2016   Osteoarthritis    Primary osteoarthritis of both hands 04/08/2018   Sjogren's syndrome with keratoconjunctivitis sicca (HCC) 04/08/2018   +ANA, +Ro, +La, sicca symptoms, fatigue   Sleep apnea    Trochanteric bursitis, right hip 01/09/2018    Family History  Problem Relation Age of Onset   Lupus Mother    Osteoarthritis Mother    Polymyalgia rheumatica Mother    Osteoarthritis Father    Alzheimer's disease Father    Breast cancer Maternal Grandmother    Colon cancer Neg Hx    Esophageal cancer Neg Hx    Rectal cancer Neg Hx    Stomach cancer Neg Hx    Past Surgical History:  Procedure Laterality Date   CATARACT EXTRACTION, BILATERAL  07/2022   HYSTEROTOMY     KNEE SURGERY Right    LYMPH NODE BIOPSY     groin    NASAL SEPTUM SURGERY  12/2020   Social History   Social History Narrative   Not on file   Immunization History  Administered Date(s) Administered   INFLUENZA, HIGH DOSE SEASONAL PF 06/24/2018   Moderna Sars-Covid-2 Vaccination 09/23/2019, 10/20/2019, 06/30/2020     Objective: Vital Signs: BP 118/75 (BP Location: Left Arm, Patient Position: Sitting, Cuff Size: Normal)   Pulse 93   Resp 14   Ht 5' 3 (1.6 m)   Wt 186 lb (84.4 kg)   BMI 32.95 kg/m    Physical Exam Vitals and nursing note reviewed.  Constitutional:      Appearance: She is well-developed.  HENT:     Head: Normocephalic and atraumatic.  Eyes:     Conjunctiva/sclera: Conjunctivae normal.  Cardiovascular:     Rate and Rhythm: Normal rate and regular rhythm.     Heart sounds: Normal heart sounds.  Pulmonary:     Effort: Pulmonary effort is  normal.     Breath sounds: Normal breath sounds.  Abdominal:     General: Bowel sounds are normal.     Palpations: Abdomen is soft.  Musculoskeletal:     Cervical back: Normal range of motion.  Lymphadenopathy:     Cervical: No cervical adenopathy.  Skin:    General: Skin is warm and dry.     Capillary Refill: Capillary refill takes less than 2 seconds.  Neurological:     Mental Status: She is alert and oriented to person, place, and time.  Psychiatric:        Behavior: Behavior normal.      Musculoskeletal Exam: C-spine, thoracic spine, lumbar spine and good range of motion.  Shoulder joints, elbow joints, wrist joints, MCPs, PIPs, DIPs have good range of motion with no synovitis.  PIP and DIP thickening consistent with osteoarthritis of both hands.  Right hip is painful limited range of motion.  Tenderness over  the right trochanteric bursa.  Left hip has good range of motion with no discomfort.  Both knee joints have good range of motion with no warmth or effusion.  Ankle joints have good range of motion with no tenderness or joint swelling.  CDAI Exam: CDAI Score: -- Patient Global: --; Provider Global: -- Swollen: --; Tender: -- Joint Exam 04/26/2024   No joint exam has been documented for this visit   There is currently no information documented on the homunculus. Go to the Rheumatology activity and complete the homunculus joint exam.  Investigation: No additional findings.  Imaging: No results found.  Recent Labs: Lab Results  Component Value Date   WBC 3.7 (L) 11/25/2023   HGB 12.9 11/25/2023   PLT 203 11/25/2023   NA 138 11/25/2023   K 4.3 11/25/2023   CL 104 11/25/2023   CO2 26 11/25/2023   GLUCOSE 99 11/25/2023   BUN 15 11/25/2023   CREATININE 0.77 11/25/2023   BILITOT 0.5 11/25/2023   AST 24 11/25/2023   ALT 7 11/25/2023   PROT 7.5 11/25/2023   CALCIUM 9.7 11/25/2023   GFRAA 80 10/24/2020    Speciality Comments: PLQ Eye Exam 03/19/2023  WNL, DR.  Lynwood Glenn, OD Follow up 1 year  Patient states she had an eye exam at [facility] in [place] in December and will have another one this December. Left a message with [facility] to fax over patient's most recent eye exam.   Procedures:  No procedures performed Allergies: Codeine      Assessment / Plan:     Visit Diagnoses: Sjogren's syndrome with keratoconjunctivitis sicca (HCC) - +ANA, +Ro, +La, +RF, sicca symptoms, fatigue: Patient continues to have chronic sicca symptoms, particularly symptoms of dry eyes.  She has been using Restasis and Systane for symptomatic relief.  Discussed the use of an eye mask, warm compresses, or humidifier as well.  She continues to see ophthalmology on a regular basis.  Her symptoms of dry mouth have been manageable.  She continues to see the dentist every 6 months.  No cervical lymphadenopathy was noted on examination today.  No parotid swelling or tenderness.  She has not had any oral or nasal ulcerations. She has clinically been doing well taking Plaquenil  200 mg 1 tablet by mouth twice daily Monday through Friday.  She is tolerating Plaquenil  without any side effects and has not had any gaps in therapy. Lab work from 11/25/2023 is reviewed today in the office: Complements within normal limits, ESR within normal limits, double-stranded negative, urine protein creatinine ratio within normal limits, CMP within normal limits, white blood cell count 3.7, absolute lymphocytes remain low.  The following lab work will be obtained today for further evaluation.  Discussed the increased risk for developing lymphoma in patients with Sjogren's syndrome.  Discussed the increased risk for developing interstitial lung disease.  Lungs were clear to auscultation today.  She will notify us  if she develops any new or worsening pulmonary symptoms. She will notify us  if she develops any signs or symptoms of a flare.  She will follow-up in the office in 5 months or sooner if  needed.  - Plan: Protein / creatinine ratio, urine, CBC with Differential/Platelet, ANA, Anti-DNA antibody, double-stranded, C3 and C4, Sedimentation rate, Comprehensive metabolic panel with GFR, Sjogrens syndrome-A extractable nuclear antibody, Sjogrens syndrome-B extractable nuclear antibody, Rheumatoid factor, Serum protein electrophoresis with reflex  High risk medication use - Plaquenil  200 mg 1 tablet by mouth twice daily monday through friday.  CBC and CMP updated on 11/25/23. Orders for CBC and CMP released today.   PLQ Eye Exam 03/19/2023 WNL, DR. Lynwood Glenn, OD.  According to the patient her most recent Pap and eye examination was in December 2024. We will call to obtain updated records.  - Plan: CBC with Differential/Platelet, Comprehensive metabolic panel with GFR  Positive ANA (antinuclear antibody) - ANA 1: 1280 nuclear, fine speckled, Ro>8, La>8, RF 15: Recent rheumatoid factor was negative. Plan to update the following lab work today for further evluation. - Plan: ANA, Anti-DNA antibody, double-stranded, C3 and C4  Livedo reticularis: Involving upper and lower extremities.  Primary osteoarthritis of both hands: She has PIP and DIP thickening consistent with osteoarthritis of both hands.  No synovitis noted.  Complete fist formation bilaterally.  Primary osteoarthritis of right hip: Chronic pain.  Patient has been getting intra-articular hip injections and trochanteric bursa injections every 3 months.  Limited and painful ROM noted on examination today.  Discomfort when rising from a seated position. She is considering proceeding with a right total hip replacement in January/February 2026.  Chronic pain of left knee - MRI of the left knee was performed on 05/11/2022: Mild tricompartmental degenerative change.  No warmth or effusion noted.  Other fatigue: Chronic, stable.  Fibromyalgia: She has intermittent myalgias and muscle tenderness due to fibromyalgia.  Overall her symptoms  have been manageable.  Osteopenia of multiple sites - DEXA previously ordered by PCP- 08/30/2019 which showed T score -1.6.  Overdue to update DEXA.   Hair loss: She continues to have hair thinning.  Other medical conditions are listed as follows:   Family history of systemic lupus erythematosus (SLE) in mother  Dyslipidemia  Obstructive sleep apnea syndrome  History of depression  Former smoker  Orders: Orders Placed This Encounter  Procedures   Protein / creatinine ratio, urine   CBC with Differential/Platelet   ANA   Anti-DNA antibody, double-stranded   C3 and C4   Sedimentation rate   Comprehensive metabolic panel with GFR   Sjogrens syndrome-A extractable nuclear antibody   Sjogrens syndrome-B extractable nuclear antibody   Rheumatoid factor   Serum protein electrophoresis with reflex   No orders of the defined types were placed in this encounter.    Follow-Up Instructions: Return in about 5 months (around 09/26/2024) for Sjogren's syndrome.   Waddell CHRISTELLA Craze, PA-C  Note - This record has been created using Dragon software.  Chart creation errors have been sought, but may not always  have been located. Such creation errors do not reflect on  the standard of medical care.

## 2024-04-15 ENCOUNTER — Encounter: Payer: Self-pay | Admitting: Orthopaedic Surgery

## 2024-04-15 ENCOUNTER — Ambulatory Visit (INDEPENDENT_AMBULATORY_CARE_PROVIDER_SITE_OTHER): Admitting: Orthopaedic Surgery

## 2024-04-15 DIAGNOSIS — M25551 Pain in right hip: Secondary | ICD-10-CM

## 2024-04-15 DIAGNOSIS — M1611 Unilateral primary osteoarthritis, right hip: Secondary | ICD-10-CM | POA: Diagnosis not present

## 2024-04-15 NOTE — Progress Notes (Signed)
 The is a very active 73 year old female who is sent to us  by my partner Dr. Burnetta due to arthritis in her right hip and right hip pain that is failed conservative treatment.  She does have known bone-on-bone arthritis of her right knee and she never had injection of the right knee but the right knee is not nearly as symptomatic as the right hip.  She had a MRI of her right hip back in 2023 showing mild arthritis and this is slowly got worse with time.  Injections have helped her in the past but is getting where the injections are not helping as much.  She last had a steroid injection in her right hip joint about 3 weeks ago under ultrasound guidance.  She has had a lateral hip injection as well.  The MRI of her hip did show some cartilage changes in the femoral head but also tendinosis of the gluteus medius and minimus tendons.  She does report that her right hip pain is daily and it is beginning to detrimentally affect her mobility, her quality of life and her actives daily living.  On exam her left hip moves smoothly and fluidly with no blocks to rotation.  Her right hip has pain with internal and external rotation and is all stiff with rotation as well.  Her right knee has some varus malalignment but is just really not a symptomatic surprisingly given x-ray showing varus malalignment and bone-on-bone wear of the medial compartment of her right knee.  We did review x-rays from earlier this year of her right hip and it does show a superior lateral osteophyte off the femoral head as well as osteophytes of the inferior joint and joint space narrowing when compared to previous x-rays.  We had a long and thorough discussion about hip replacement surgery and I gave her handout about her replacement surgery as well as showed her hip replacement model.  We discussed what the surgery involves in detail as well as discussed the risk and benefits of surgery and what to expect from an intraoperative and postoperative  standpoint.  She does have our surgery scheduler's card and we will call for scheduling her for a right hip replacement when she feels like she is ready for that type of surgery.  She says she may be really before the end of the year but may wait until early next year depending on how she is feeling.

## 2024-04-16 ENCOUNTER — Encounter: Payer: Self-pay | Admitting: Orthopaedic Surgery

## 2024-04-26 ENCOUNTER — Ambulatory Visit: Attending: Physician Assistant | Admitting: Physician Assistant

## 2024-04-26 ENCOUNTER — Encounter: Payer: Self-pay | Admitting: Physician Assistant

## 2024-04-26 VITALS — BP 118/75 | HR 93 | Resp 14 | Ht 63.0 in | Wt 186.0 lb

## 2024-04-26 DIAGNOSIS — R5383 Other fatigue: Secondary | ICD-10-CM

## 2024-04-26 DIAGNOSIS — R231 Pallor: Secondary | ICD-10-CM

## 2024-04-26 DIAGNOSIS — L659 Nonscarring hair loss, unspecified: Secondary | ICD-10-CM

## 2024-04-26 DIAGNOSIS — M1611 Unilateral primary osteoarthritis, right hip: Secondary | ICD-10-CM

## 2024-04-26 DIAGNOSIS — Z87891 Personal history of nicotine dependence: Secondary | ICD-10-CM

## 2024-04-26 DIAGNOSIS — M8589 Other specified disorders of bone density and structure, multiple sites: Secondary | ICD-10-CM

## 2024-04-26 DIAGNOSIS — Z8269 Family history of other diseases of the musculoskeletal system and connective tissue: Secondary | ICD-10-CM

## 2024-04-26 DIAGNOSIS — E785 Hyperlipidemia, unspecified: Secondary | ICD-10-CM

## 2024-04-26 DIAGNOSIS — M3501 Sicca syndrome with keratoconjunctivitis: Secondary | ICD-10-CM

## 2024-04-26 DIAGNOSIS — M19042 Primary osteoarthritis, left hand: Secondary | ICD-10-CM

## 2024-04-26 DIAGNOSIS — G8929 Other chronic pain: Secondary | ICD-10-CM

## 2024-04-26 DIAGNOSIS — M797 Fibromyalgia: Secondary | ICD-10-CM

## 2024-04-26 DIAGNOSIS — M25562 Pain in left knee: Secondary | ICD-10-CM

## 2024-04-26 DIAGNOSIS — Z79899 Other long term (current) drug therapy: Secondary | ICD-10-CM

## 2024-04-26 DIAGNOSIS — M19041 Primary osteoarthritis, right hand: Secondary | ICD-10-CM

## 2024-04-26 DIAGNOSIS — R768 Other specified abnormal immunological findings in serum: Secondary | ICD-10-CM

## 2024-04-26 DIAGNOSIS — G4733 Obstructive sleep apnea (adult) (pediatric): Secondary | ICD-10-CM

## 2024-04-26 DIAGNOSIS — Z8659 Personal history of other mental and behavioral disorders: Secondary | ICD-10-CM

## 2024-04-27 ENCOUNTER — Ambulatory Visit: Payer: Self-pay | Admitting: Physician Assistant

## 2024-04-27 NOTE — Progress Notes (Signed)
 Ro and La antibodies remain positive.   dsDNA is negative  Complements WNL RF negative

## 2024-04-27 NOTE — Progress Notes (Signed)
 CBC WNL  Glucose is 120, rest of CMP WNL  RF negative  Urine protein creatinine ratio WNL  ESR is elevated-55-any recent infections?  No active synovitis was noted on exam.  Rest of labs pending

## 2024-04-28 LAB — COMPREHENSIVE METABOLIC PANEL WITH GFR
AG Ratio: 1.1 (calc) (ref 1.0–2.5)
ALT: 7 U/L (ref 6–29)
AST: 21 U/L (ref 10–35)
Albumin: 4 g/dL (ref 3.6–5.1)
Alkaline phosphatase (APISO): 51 U/L (ref 37–153)
BUN: 15 mg/dL (ref 7–25)
CO2: 26 mmol/L (ref 20–32)
Calcium: 9.6 mg/dL (ref 8.6–10.4)
Chloride: 103 mmol/L (ref 98–110)
Creat: 0.7 mg/dL (ref 0.60–1.00)
Globulin: 3.5 g/dL (ref 1.9–3.7)
Glucose, Bld: 120 mg/dL — ABNORMAL HIGH (ref 65–99)
Potassium: 4.2 mmol/L (ref 3.5–5.3)
Sodium: 135 mmol/L (ref 135–146)
Total Bilirubin: 0.5 mg/dL (ref 0.2–1.2)
Total Protein: 7.5 g/dL (ref 6.1–8.1)
eGFR: 92 mL/min/1.73m2 (ref >=60)

## 2024-04-28 LAB — C3 AND C4
C3 Complement: 144 mg/dL (ref 83–193)
C4 Complement: 26 mg/dL (ref 15–57)

## 2024-04-28 LAB — ANA: Anti Nuclear Antibody (ANA): POSITIVE — AB

## 2024-04-28 LAB — CBC WITH DIFFERENTIAL/PLATELET
Absolute Lymphocytes: 1052 {cells}/uL (ref 850–3900)
Absolute Monocytes: 408 {cells}/uL (ref 200–950)
Basophils Absolute: 80 {cells}/uL (ref 0–200)
Basophils Relative: 2 %
Eosinophils Absolute: 168 {cells}/uL (ref 15–500)
Eosinophils Relative: 4.2 %
HCT: 39.1 % (ref 35.0–45.0)
Hemoglobin: 12.5 g/dL (ref 11.7–15.5)
MCH: 26.8 pg — ABNORMAL LOW (ref 27.0–33.0)
MCHC: 32 g/dL (ref 32.0–36.0)
MCV: 83.9 fL (ref 80.0–100.0)
MPV: 10.3 fL (ref 7.5–12.5)
Monocytes Relative: 10.2 %
Neutro Abs: 2292 {cells}/uL (ref 1500–7800)
Neutrophils Relative %: 57.3 %
Platelets: 232 Thousand/uL (ref 140–400)
RBC: 4.66 Million/uL (ref 3.80–5.10)
RDW: 13.1 % (ref 11.0–15.0)
Total Lymphocyte: 26.3 %
WBC: 4 Thousand/uL (ref 3.8–10.8)

## 2024-04-28 LAB — PROTEIN ELECTROPHORESIS, SERUM, WITH REFLEX
Albumin ELP: 3.8 g/dL (ref 3.8–4.8)
Alpha 1: 0.4 g/dL — ABNORMAL HIGH (ref 0.2–0.3)
Alpha 2: 0.8 g/dL (ref 0.5–0.9)
Beta 2: 0.5 g/dL (ref 0.2–0.5)
Beta Globulin: 0.5 g/dL (ref 0.4–0.6)
Gamma Globulin: 1.6 g/dL (ref 0.8–1.7)
Total Protein: 7.6 g/dL (ref 6.1–8.1)

## 2024-04-28 LAB — RHEUMATOID FACTOR: Rheumatoid fact SerPl-aCnc: 12 [IU]/mL (ref <14)

## 2024-04-28 LAB — SJOGRENS SYNDROME-B EXTRACTABLE NUCLEAR ANTIBODY: SSB (La) (ENA) Antibody, IgG: 4.2 AI — AB

## 2024-04-28 LAB — ANTI-NUCLEAR AB-TITER (ANA TITER)
ANA TITER: 1:40 {titer} — ABNORMAL HIGH
ANA Titer 1: 1:320 {titer} — ABNORMAL HIGH

## 2024-04-28 LAB — PROTEIN / CREATININE RATIO, URINE
Creatinine, Urine: 112 mg/dL (ref 20–275)
Protein/Creat Ratio: 107 mg/g{creat} (ref 24–184)
Protein/Creatinine Ratio: 0.107 mg/mg{creat} (ref 0.024–0.184)
Total Protein, Urine: 12 mg/dL (ref 5–24)

## 2024-04-28 LAB — ANTI-DNA ANTIBODY, DOUBLE-STRANDED: ds DNA Ab: 1 [IU]/mL

## 2024-04-28 LAB — SEDIMENTATION RATE: Sed Rate: 55 mm/h — ABNORMAL HIGH (ref 0–30)

## 2024-04-28 LAB — SJOGRENS SYNDROME-A EXTRACTABLE NUCLEAR ANTIBODY: SSA (Ro) (ENA) Antibody, IgG: >8 AI — AB

## 2024-04-29 NOTE — Progress Notes (Signed)
 ANA remains positive.  SPEP-no abnormal protein bands

## 2024-05-15 ENCOUNTER — Other Ambulatory Visit: Payer: Self-pay | Admitting: Rheumatology

## 2024-05-17 ENCOUNTER — Encounter (HOSPITAL_BASED_OUTPATIENT_CLINIC_OR_DEPARTMENT_OTHER): Payer: Self-pay | Admitting: Radiology

## 2024-05-17 ENCOUNTER — Ambulatory Visit (HOSPITAL_BASED_OUTPATIENT_CLINIC_OR_DEPARTMENT_OTHER)
Admission: RE | Admit: 2024-05-17 | Discharge: 2024-05-17 | Disposition: A | Discharge status: Home / Self Care | Source: Ambulatory Visit | Attending: Student | Admitting: Radiology

## 2024-05-17 ENCOUNTER — Ambulatory Visit (HOSPITAL_BASED_OUTPATIENT_CLINIC_OR_DEPARTMENT_OTHER): Admitting: Student

## 2024-05-17 VITALS — BP 113/70 | HR 90 | Temp 98.4°F | Resp 16 | Ht 62.6 in | Wt 188.6 lb

## 2024-05-17 DIAGNOSIS — E785 Hyperlipidemia, unspecified: Secondary | ICD-10-CM

## 2024-05-17 DIAGNOSIS — Z780 Asymptomatic menopausal state: Secondary | ICD-10-CM | POA: Diagnosis not present

## 2024-05-17 DIAGNOSIS — Z1231 Encounter for screening mammogram for malignant neoplasm of breast: Secondary | ICD-10-CM

## 2024-05-17 DIAGNOSIS — R0602 Shortness of breath: Secondary | ICD-10-CM | POA: Diagnosis not present

## 2024-05-17 DIAGNOSIS — G4733 Obstructive sleep apnea (adult) (pediatric): Secondary | ICD-10-CM

## 2024-05-17 DIAGNOSIS — E2839 Other primary ovarian failure: Secondary | ICD-10-CM

## 2024-05-17 DIAGNOSIS — Z87891 Personal history of nicotine dependence: Secondary | ICD-10-CM

## 2024-05-17 DIAGNOSIS — Z131 Encounter for screening for diabetes mellitus: Secondary | ICD-10-CM

## 2024-05-17 DIAGNOSIS — Z23 Encounter for immunization: Secondary | ICD-10-CM

## 2024-05-17 DIAGNOSIS — J310 Chronic rhinitis: Secondary | ICD-10-CM

## 2024-05-17 DIAGNOSIS — M3501 Sicca syndrome with keratoconjunctivitis: Secondary | ICD-10-CM | POA: Diagnosis not present

## 2024-05-17 DIAGNOSIS — M797 Fibromyalgia: Secondary | ICD-10-CM

## 2024-05-17 DIAGNOSIS — Z7689 Persons encountering health services in other specified circumstances: Secondary | ICD-10-CM

## 2024-05-17 DIAGNOSIS — F419 Anxiety disorder, unspecified: Secondary | ICD-10-CM

## 2024-05-17 MED ORDER — FLUTICASONE PROPIONATE 50 MCG/ACT NA SUSP: 1.0000 | Freq: Every day | NASAL | 6 refills | Status: AC

## 2024-05-17 NOTE — Progress Notes (Unsigned)
 Established Patient Office Visit  Subjective   Patient ID: Kaitlyn Hudson, female    DOB: [birth year]  Age: 73 y.o. MRN: [id number]  Chief Complaint  Patient presents with   Establish Care    Here to establish care. Past due for mammogram & DEXA. Would like to have them done here. Would like labs. Already ate today. Was questioning if she needs CT lung cancer screening?    Sinus Problem    Has constant sinus drainage. Left nostril drains most of the time. Has bad odor. Feels like something is not right. Dr. Penne Killian did surgery on deviated septum.    HPI  Discussed the use of AI scribe software for clinical note transcription with the patient, who gave verbal consent to proceed.  History of Present Illness   Kaitlyn Hudson is a 73 year old female with Sjogren's syndrome and fibromyalgia who presents to establish care and discuss screening and ongoing management of her conditions.  She has been following with rheumatology for sjogren's and fibromyalgia. She is currently on Plaquenil  for Sjogren's and uses Systane and Restasis for dry eyes.  She has hyperlipidemia and is on Pravachol 20 mg for cholesterol management. Her cholesterol levels have not been checked in the last two prescription refills.  She has a significant smoking history, having smoked for about 30 years before quitting in 2012. She experiences shortness of breath with walking occasionally and wonders if it is due to being overweight and out of shape or another issue. She has not seen a cardiologist in five years.  She reports a long-standing sinus issue with constant drainage, primarily on the left side, sometimes with a bad odor. This has been ongoing since before her deviated septum surgery five years ago. She has used Flonase  in the past but is cautious due to her Sjogren's.  She has a history of sleep apnea, diagnosed around the time of her Sjogren's diagnosis. She used a CPAP machine but has not used it in about  five years. She experiences fatigue and is unsure about how to obtain new supplies for her CPAP.  She has a history of osteopenia. She is past due for a mammogram and bone scan.  Her social history includes being a former smoker and having lost her husband during the COVID pandemic, which affected her ability to continue water aerobics, an activity she previously enjoyed.      Patient Active Problem List   Diagnosis Date Noted   Unilateral primary osteoarthritis, left knee 05/23/2022   Acute lateral meniscus tear of left knee 05/07/2022   Pain in right hip 03/28/2022   Trochanteric bursitis, right hip 03/28/2022   CAD in native artery 06/26/2018   Sleep apnea 05/15/2018   Angina pectoris (HCC) 05/15/2018   SOB (shortness of breath) on exertion 05/11/2018   Chest pressure 05/11/2018   Fibromyalgia 05/11/2018   Sjogren's syndrome with keratoconjunctivitis sicca (HCC) 04/08/2018   Former smoker 04/08/2018   Family history of systemic lupus erythematosus (SLE) in mother 04/08/2018   Dyslipidemia 04/08/2018   Primary osteoarthritis of both hands 04/08/2018   History of depression 03/16/2018   History of hyperlipidemia 03/16/2018   Anxiety 04/02/2016   History of cervical dysplasia 04/02/2016   Hyperlipidemia 04/02/2016   Past Medical History:  Diagnosis Date   Allergic rhinitis    Allergy    Anxiety 04/02/2016   Cataract    Chest pressure 05/11/2018   Depression    Dyslipidemia 04/08/2018   Exertional  shortness of breath 05/11/2018   Family history of systemic lupus erythematosus (SLE) in mother 04/08/2018   Fibromyalgia    Former smoker 04/08/2018   GERD (gastroesophageal reflux disease)    History of cervical dysplasia 04/02/2016   2013; hysterectomy d/t recurrent dysplasia s/p LEEP   History of depression 03/16/2018   History of hyperlipidemia 03/16/2018   Hypercholesteremia 04/02/2016   Osteoarthritis    Primary osteoarthritis of both hands 04/08/2018   Sjogren's  syndrome with keratoconjunctivitis sicca (HCC) 04/08/2018   +ANA, +Ro, +La, sicca symptoms, fatigue   Sleep apnea    Trochanteric bursitis, right hip 01/09/2018   Past Surgical History:  Procedure Laterality Date   ABDOMINAL HYSTERECTOMY     HAS OVARIES   CATARACT EXTRACTION, BILATERAL  07/2022   KNEE SURGERY Right    LYMPH NODE BIOPSY     groin    NASAL SEPTUM SURGERY  12/2020   Social History   Tobacco Use   Smoking status: Former    Current packs/day: 0.00    Average packs/day: 1 pack/day for 30.0 years (30.0 ttl pk-yrs)    Types: Cigarettes    Start date: 09/02/1980    Quit date: 09/02/2010    Years since quitting: 13.7    Passive exposure: Never   Smokeless tobacco: Never  Vaping Use   Vaping status: Never Used  Substance Use Topics   Alcohol use: Yes    Comment: maybe wine every6 months   Drug use: Never      ROS Per HPI.    Objective:     BP 113/70   Pulse 90   Temp 98.4 F (36.9 C) (Oral)   Resp 16   Ht 5' 2.6 (1.59 m)   Wt 188 lb 9.6 oz (85.5 kg)   SpO2 96%   BMI 33.84 kg/m  BP Readings from Last 3 Encounters:  05/17/24 113/70  04/26/24 118/75  11/25/23 104/66   Wt Readings from Last 3 Encounters:  05/17/24 188 lb 9.6 oz (85.5 kg)  04/26/24 186 lb (84.4 kg)  11/25/23 183 lb (83 kg)      Physical Exam Constitutional:      General: She is not in acute distress.    Appearance: Normal appearance. She is not ill-appearing.  HENT:     Head: Normocephalic and atraumatic.     Nose: Nose normal.  Eyes:     General: No scleral icterus.    Conjunctiva/sclera: Conjunctivae normal.  Cardiovascular:     Rate and Rhythm: Normal rate and regular rhythm.     Heart sounds: Normal heart sounds. No murmur heard.    No friction rub.  Pulmonary:     Effort: Pulmonary effort is normal. No respiratory distress.     Breath sounds: Normal breath sounds. No wheezing, rhonchi or rales.  Musculoskeletal:        General: Normal range of motion.  Skin:     General: Skin is warm and dry.     Coloration: Skin is not jaundiced or pale.  Neurological:     General: No focal deficit present.     Mental Status: She is alert.  Psychiatric:        Mood and Affect: Mood normal.        Behavior: Behavior normal.      Results for orders placed or performed in visit on 05/17/24  Lab report - scanned  Result Value Ref Range   EGFR 72.0   Lab report - scanned  Result Value Ref  Range   EGFR 84.0     Last CBC Lab Results  Component Value Date   WBC 4.0 04/26/2024   HGB 12.5 04/26/2024   HCT 39.1 04/26/2024   MCV 83.9 04/26/2024   MCH 26.8 (L) 04/26/2024   RDW 13.1 04/26/2024   PLT 232 04/26/2024   Last metabolic panel Lab Results  Component Value Date   GLUCOSE 120 (H) 04/26/2024   NA 135 04/26/2024   K 4.2 04/26/2024   CL 103 04/26/2024   CO2 26 04/26/2024   BUN 15 04/26/2024   CREATININE 0.70 04/26/2024   EGFR 92 04/26/2024   CALCIUM 9.6 04/26/2024   PROT 7.5 04/26/2024   PROT 7.6 04/26/2024   BILITOT 0.5 04/26/2024   AST 21 04/26/2024   ALT 7 04/26/2024      The ASCVD Risk score (Arnett DK, et al., 2019) failed to calculate for the following reasons:   Cannot find a previous HDL lab   Cannot find a previous total cholesterol lab    Assessment & Plan:   Assessment and Plan    Establishment of care Establishing care with a new primary care provider. Previous primary care was with Dr. Norleen Churn, last physical over a year ago. - Schedule a physical exam in six weeks  Obstructive sleep apnea, moderate Moderate obstructive sleep apnea diagnosed previously, not using CPAP for five years. Discussed potential for CPAP titration or implantable device- patient would like workup. - Refer to pulmonology for evaluation and potential CPAP titration or implantable device  Rhinitis Rhinitis with left-sided drainage, present for five years. Vasomotor rhinitis vs allergic rhinitis. Discussed the use of Flonase  and potential  drying effects due to Sjogren's. - Recommend Flonase  once daily, adjust based on dryness - Consider over-the-counter Zyrtec or Xyzal  Sjogren's syndrome Sjogren's syndrome managed by rheumatology. Current medications include Plaquenil , Systane, and Restasis for dry eyes. - continue to monitor - Continue to follow with rheumatology  Fibromyalgia Fibromyalgia diagnosed by rheumatology. Discussed the benefits of water aerobics for exercise, which is easier on the joints. - Encourage participation in water aerobics for joint-friendly exercise  Hyperlipidemia Hyperlipidemia managed with Pravachol 2 mg. Cholesterol levels have not been checked recently. Discussed the impact of non-fasting on triglyceride levels, which can increase by 50-60 points postprandially. - Check cholesterol panel during the physical exam in six weeks  General Health Maintenance Due for several screenings and vaccinations. Discussed lung cancer screening due to smoking history, mammogram, bone density scan, and vaccinations. - Order low dose CT of the chest for lung cancer screening - Order screening mammogram - Order bone density scan - Administer tetanus vaccine today - Plan pneumonia vaccine at next visit      Return in about 6 weeks (around 06/28/2024) for Annual Physical.    Tecia Cinnamon T Niccolas Loeper, PA-C

## 2024-05-17 NOTE — Telephone Encounter (Signed)
 Last Fill: 01/07/2024  Eye exam: 03/19/2023   Labs: 04/26/2024 CBC WNL  Glucose is 120, rest of CMP WNL  RF negative  Urine protein creatinine ratio WNL  ESR is elevated-55 Ro and La antibodies remain positive.   dsDNA is negative  Complements WNL RF negative  ANA remains positive.   SPEP no abnormal protein bands.  Next Visit: 09/29/2024  Last Visit: 04/26/2024  IK:Dgnhmzw'd syndrome with keratoconjunctivitis sicca   Current Dose per office note on 04/26/2024: Plaquenil  200 mg 1 tablet by mouth twice daily monday through friday.   I called [facility] to obtain updated eye exam.   Okay to refill Plaquenil ?

## 2024-05-17 NOTE — Patient Instructions (Addendum)
 It was nice to see you today!  Please use the flonase  once daily until our next visit to see if this helps the runny nose.  If you have any problems before your next visit feel free to message me via MyChart (minor issues or questions) or call the office, otherwise you may reach out to schedule an office visit.  Thank you! Abrham Maslowski, PA-C

## 2024-05-18 ENCOUNTER — Ambulatory Visit (HOSPITAL_BASED_OUTPATIENT_CLINIC_OR_DEPARTMENT_OTHER): Payer: Self-pay | Admitting: Student

## 2024-05-18 DIAGNOSIS — E785 Hyperlipidemia, unspecified: Secondary | ICD-10-CM

## 2024-05-28 ENCOUNTER — Ambulatory Visit (HOSPITAL_BASED_OUTPATIENT_CLINIC_OR_DEPARTMENT_OTHER)
Admission: RE | Admit: 2024-05-28 | Discharge: 2024-05-28 | Disposition: A | Discharge status: Home / Self Care | Source: Ambulatory Visit | Attending: Student | Admitting: Student

## 2024-05-28 DIAGNOSIS — Z87891 Personal history of nicotine dependence: Secondary | ICD-10-CM

## 2024-06-10 ENCOUNTER — Telehealth: Payer: Self-pay

## 2024-06-10 NOTE — Telephone Encounter (Signed)
 Hi Kodie Kishi,  I wanted to reach out to you to get set up on Dr. Gennett schedule for hip replacement surgery. I am hoping to schedule it for sometime the week of October 11, 2024, if possible. My information is as follows:  Kaitlyn Hudson [address] [place], [place]  [address] DOB Jan 09, 2051 [phone]  I look forward to hearing from you.  Kaitlyn Hudson

## 2024-06-16 NOTE — Telephone Encounter (Signed)
I called patient and scheduled surgery. 

## 2024-06-21 ENCOUNTER — Encounter (HOSPITAL_BASED_OUTPATIENT_CLINIC_OR_DEPARTMENT_OTHER): Payer: Self-pay | Admitting: Student

## 2024-06-28 ENCOUNTER — Encounter (HOSPITAL_BASED_OUTPATIENT_CLINIC_OR_DEPARTMENT_OTHER): Payer: Self-pay | Admitting: Student

## 2024-06-28 ENCOUNTER — Ambulatory Visit (INDEPENDENT_AMBULATORY_CARE_PROVIDER_SITE_OTHER): Admitting: Student

## 2024-06-28 VITALS — BP 109/70 | HR 72 | Temp 97.6°F | Resp 16 | Ht 62.6 in | Wt 190.5 lb

## 2024-06-28 DIAGNOSIS — G4733 Obstructive sleep apnea (adult) (pediatric): Secondary | ICD-10-CM | POA: Diagnosis not present

## 2024-06-28 DIAGNOSIS — J439 Emphysema, unspecified: Secondary | ICD-10-CM | POA: Insufficient documentation

## 2024-06-28 DIAGNOSIS — Z23 Encounter for immunization: Secondary | ICD-10-CM | POA: Diagnosis not present

## 2024-06-28 DIAGNOSIS — I2510 Atherosclerotic heart disease of native coronary artery without angina pectoris: Secondary | ICD-10-CM

## 2024-06-28 DIAGNOSIS — R161 Splenomegaly, not elsewhere classified: Secondary | ICD-10-CM | POA: Insufficient documentation

## 2024-06-28 DIAGNOSIS — E785 Hyperlipidemia, unspecified: Secondary | ICD-10-CM

## 2024-06-28 DIAGNOSIS — R0602 Shortness of breath: Secondary | ICD-10-CM

## 2024-06-28 DIAGNOSIS — Z131 Encounter for screening for diabetes mellitus: Secondary | ICD-10-CM

## 2024-06-28 DIAGNOSIS — M3501 Sicca syndrome with keratoconjunctivitis: Secondary | ICD-10-CM

## 2024-06-28 HISTORY — DX: Emphysema, unspecified: J43.9

## 2024-06-28 HISTORY — DX: Splenomegaly, not elsewhere classified: R16.1

## 2024-06-28 NOTE — Patient Instructions (Addendum)
 It was nice to see you today!  If you have any problems before your next visit feel free to message me via MyChart (minor issues or questions) or call the office, otherwise you may reach out to schedule an office visit.  Thank you! Pau Banh, PA-C

## 2024-06-28 NOTE — Progress Notes (Signed)
 Established Patient Office Visit  Subjective   Patient ID: Kaitlyn Hudson, female    DOB: [birth year]  Age: 74 y.o. MRN: [id number]  Chief Complaint  Patient presents with   Medical Management of Chronic Issues    Follow up. Has right total hip surgery 10/2024. Stopped zoloft 6 weeks ago. Did not feel like she needed it.     HPI  Discussed the use of AI scribe software for clinical note transcription with the patient, who gave verbal consent to proceed.  History of Present Illness   Kaitlyn Hudson is a 73 year old female who presents for a follow-up visit.  She is scheduled for a total hip replacement in February due to severe osteoarthritis, described as 'bone on bone' in her hip. Her right knee is also 'bone on bone' but is not currently symptomatic.  She discontinued Zoloft six weeks ago, tapering off by skipping doses, and has experienced no issues since stopping the medication.  A recent low dose CT of the chest was performed, and she was told there were some nodules and mention of mild emphysema. She has worsening shortness of breath, especially with exertion, and significant fatigue. She has an appointment with a pulmonologist scheduled for tomorrow.  She has a history of coronary artery disease with previous findings of moderate stenosis in the mid LAD and calcifications in her coronary arteries. She is currently on low dose aspirin  and pravastatin. No chest pain, abdominal pain, nausea, jaw pain, arm pain, or palpitations, though she occasionally experiences brief episodes of racing heart.  She has a history of Sjogren's syndrome and fibromyalgia, which may contribute to her fatigue.  She reports a slightly enlarged spleen was noted on imaging.  She is on CPAP for sleep apnea but has struggled with consistent use due to personal circumstances.  She has had shingles in the past and wants to avoid recurrence.      Patient Active Problem List   Diagnosis Date Noted    Splenomegaly 06/28/2024   Emphysema lung (HCC) 06/28/2024   Unilateral primary osteoarthritis, left knee 05/23/2022   Pain in right hip 03/28/2022   Trochanteric bursitis, right hip 03/28/2022   CAD in native artery 06/26/2018   Sleep apnea 05/15/2018   Angina pectoris 05/15/2018   SOB (shortness of breath) on exertion 05/11/2018   Chest pressure 05/11/2018   Fibromyalgia 05/11/2018   Sjogren's syndrome with keratoconjunctivitis sicca 04/08/2018   Former smoker 04/08/2018   Family history of systemic lupus erythematosus (SLE) in mother 04/08/2018   Dyslipidemia 04/08/2018   Primary osteoarthritis of both hands 04/08/2018   History of depression 03/16/2018   History of hyperlipidemia 03/16/2018   Anxiety 04/02/2016   History of cervical dysplasia 04/02/2016   Hypercholesteremia 04/02/2016   Past Medical History:  Diagnosis Date   Allergic rhinitis    Allergy    Anxiety 04/02/2016   See comments on depression above   Cataract    Chest pressure 05/11/2018   Depression    I was prescribed medication when I stopped smoking, not for depression. I have recently stopped taking it.   Dyslipidemia 04/08/2018   Exertional shortness of breath 05/11/2018   Family history of systemic lupus erythematosus (SLE) in mother 04/08/2018   Fibromyalgia    Former smoker 04/08/2018   GERD (gastroesophageal reflux disease)    History of cervical dysplasia 04/02/2016   2013; hysterectomy d/t recurrent dysplasia s/p LEEP   History of depression 03/16/2018   History of hyperlipidemia  03/16/2018   Hypercholesteremia 04/02/2016   Osteoarthritis    Primary osteoarthritis of both hands 04/08/2018   Sjogren's syndrome with keratoconjunctivitis sicca 04/08/2018   +ANA, +Ro, +La, sicca symptoms, fatigue   Sleep apnea    Trochanteric bursitis, right hip 01/09/2018   Social History   Tobacco Use   Smoking status: Former    Current packs/day: 0.00    Average packs/day: 1 pack/day for 30.0 years  (30.0 ttl pk-yrs)    Types: Cigarettes    Start date: 09/02/1980    Quit date: 09/02/2010    Years since quitting: 13.8    Passive exposure: Never   Smokeless tobacco: Never  Vaping Use   Vaping status: Never Used  Substance Use Topics   Alcohol use: Yes    Alcohol/week: 1.0 standard drink of alcohol    Types: 1 Glasses of wine per week    Comment: wine maybe every 6 months   Drug use: Never   Allergies  Allergen Reactions   Codeine Nausea And Vomiting      ROS Per HPI.    Objective:     BP 109/70   Pulse 72   Temp 97.6 F (36.4 C) (Oral)   Resp 16   Ht 5' 2.6 (1.59 m)   Wt 190 lb 8 oz (86.4 kg)   SpO2 97%   BMI 34.18 kg/m  BP Readings from Last 3 Encounters:  06/28/24 109/70  05/17/24 113/70  04/26/24 118/75   Wt Readings from Last 3 Encounters:  06/28/24 190 lb 8 oz (86.4 kg)  05/17/24 188 lb 9.6 oz (85.5 kg)  04/26/24 186 lb (84.4 kg)      Physical Exam Constitutional:      General: She is not in acute distress.    Appearance: Normal appearance. She is not ill-appearing.  HENT:     Head: Normocephalic and atraumatic.     Nose: Nose normal.  Eyes:     General: No scleral icterus.    Conjunctiva/sclera: Conjunctivae normal.  Cardiovascular:     Rate and Rhythm: Normal rate and regular rhythm.     Heart sounds: Normal heart sounds. No murmur heard.    No friction rub.  Pulmonary:     Effort: Pulmonary effort is normal. No respiratory distress.     Breath sounds: Normal breath sounds. No wheezing, rhonchi or rales.  Musculoskeletal:        General: Normal range of motion.  Skin:    General: Skin is warm and dry.     Coloration: Skin is not jaundiced or pale.  Neurological:     General: No focal deficit present.     Mental Status: She is alert.  Psychiatric:        Mood and Affect: Mood normal.        Behavior: Behavior normal.      No results found for any visits on 06/28/24.  Last CBC Lab Results  Component Value Date   WBC 4.0  04/26/2024   HGB 12.5 04/26/2024   HCT 39.1 04/26/2024   MCV 83.9 04/26/2024   MCH 26.8 (L) 04/26/2024   RDW 13.1 04/26/2024   PLT 232 04/26/2024   Last metabolic panel Lab Results  Component Value Date   GLUCOSE 120 (H) 04/26/2024   NA 135 04/26/2024   K 4.2 04/26/2024   CL 103 04/26/2024   CO2 26 04/26/2024   BUN 15 04/26/2024   CREATININE 0.70 04/26/2024   EGFR 92 04/26/2024   CALCIUM 9.6 04/26/2024  PROT 7.5 04/26/2024   PROT 7.6 04/26/2024   BILITOT 0.5 04/26/2024   AST 21 04/26/2024   ALT 7 04/26/2024   Last lipids No results found for: CHOL, HDL, LDLCALC, LDLDIRECT, TRIG, CHOLHDL Last hemoglobin A1c No results found for: HGBA1C    The ASCVD Risk score (Arnett DK, et al., 2019) failed to calculate for the following reasons:   Cannot find a previous HDL lab   Cannot find a previous total cholesterol lab    Assessment & Plan:   Assessment and Plan    Osteoarthritis of right hip Severe osteoarthritis of the right hip with bone-on-bone changes. Scheduled for total hip arthroplasty in February. No current issues with the right knee despite similar bone-on-bone changes. - Proceed with scheduled total hip arthroplasty in February.  Shortness of breath on exertion Shortness of breath on exertion, potentially related to pulmonary or cardiac causes. No chest pain, but emphysematous changes noted on imaging. - Proceed with pulmonology appointment for evaluation of shortness of breath and potential emphysema. - Refer to cardiology for evaluation of shortness of breath on exertion.  Emphysematous changes Mild emphysematous changes noted on chest CT but has not had PFTs so no true dx of emphysema currently. Pulmonology evaluation pending to confirm diagnosis and assess severity. - Proceed with pulmonology appointment for evaluation of emphysematous changes.  Obstructive sleep apnea Obstructive sleep apnea with nonadherence to CPAP therapy. - Proceed with  pulmonology appointment for management of obstructive sleep apnea.  Coronary artery disease Coronary artery disease with moderate stenosis in the mid LAD and coronary artery calcification. No current chest pain or cardiac symptoms. Last seen by Dr. Monetta with cardiology in 2019. Shortness of breath on exertion noted, which may warrant cardiology evaluation. - Refer to cardiology for evaluation of coronary artery disease and update imaging. Pt has a preference for Dr. Monetta if available. - Continue low-dose aspirin . - Check cholesterol panel.  Aortic atherosclerosis Aortic atherosclerosis noted on imaging, similar to coronary artery calcification. No immediate concerns but requires monitoring of cardiovascular risk factors. - Check cholesterol panel. - Monitor blood pressure and overall heart health.  Splenomegaly Slightly enlarged spleen incompletely noted on imaging. No signs of hemolytic anemia or elevated bilirubin. Further evaluation needed to determine significance. Elevated risk of lymphoma in patients with history of sjogren's- we will keep this in mind. No B symptoms noted today. - Order abdominal ultrasound to reassess spleen size and characteristics.  Sjogren's syndrome Chronic, stable. Concurrence of this and splenomegaly necessitates higher concern for Lymphoma. Chronic condition with regular monitoring by rheumatology. No complaints noted today - Continue current regimen.  General Health Maintenance Shingles vaccination recommended due to history of shingles. - Administer flu and pneumonia vaccines. - Administer shingles vaccine in two weeks. - Schedule annual wellness visit through Medicare.      I personally spent a total of 33 minutes in the care of the patient today including preparing to see the patient, getting/reviewing separately obtained history, performing a medically appropriate exam/evaluation, counseling and educating, placing orders, and documenting clinical  information in the EHR.   Return in about 6 months (around 12/27/2024) for Chronic Followup.    Angas Isabell T Jessalynn Mccowan, PA-C

## 2024-06-29 ENCOUNTER — Ambulatory Visit (INDEPENDENT_AMBULATORY_CARE_PROVIDER_SITE_OTHER): Admitting: Pulmonary Disease

## 2024-06-29 ENCOUNTER — Other Ambulatory Visit: Payer: Self-pay | Admitting: Pulmonary Disease

## 2024-06-29 VITALS — BP 104/60 | HR 100 | Ht 62.0 in | Wt 190.0 lb

## 2024-06-29 DIAGNOSIS — K219 Gastro-esophageal reflux disease without esophagitis: Secondary | ICD-10-CM | POA: Insufficient documentation

## 2024-06-29 DIAGNOSIS — G4733 Obstructive sleep apnea (adult) (pediatric): Secondary | ICD-10-CM | POA: Diagnosis not present

## 2024-06-29 DIAGNOSIS — F32A Depression, unspecified: Secondary | ICD-10-CM | POA: Insufficient documentation

## 2024-06-29 DIAGNOSIS — R0602 Shortness of breath: Secondary | ICD-10-CM | POA: Diagnosis not present

## 2024-06-29 DIAGNOSIS — T7840XA Allergy, unspecified, initial encounter: Secondary | ICD-10-CM | POA: Insufficient documentation

## 2024-06-29 DIAGNOSIS — J309 Allergic rhinitis, unspecified: Secondary | ICD-10-CM | POA: Insufficient documentation

## 2024-06-29 DIAGNOSIS — M1990 Unspecified osteoarthritis, unspecified site: Secondary | ICD-10-CM | POA: Insufficient documentation

## 2024-06-29 DIAGNOSIS — H269 Unspecified cataract: Secondary | ICD-10-CM | POA: Insufficient documentation

## 2024-06-29 LAB — LIPID PANEL
Chol/HDL Ratio: 4.8 ratio — ABNORMAL HIGH (ref 0.0–4.4)
Cholesterol, Total: 176 mg/dL (ref 100–199)
HDL: 37 mg/dL — ABNORMAL LOW (ref >39)
LDL Chol Calc (NIH): 93 mg/dL (ref 0–99)
Triglycerides: 271 mg/dL — ABNORMAL HIGH (ref 0–149)
VLDL Cholesterol Cal: 46 mg/dL — ABNORMAL HIGH (ref 5–40)

## 2024-06-29 LAB — HEMOGLOBIN A1C
Est. average glucose Bld gHb Est-mCnc: 108 mg/dL
Hgb A1c MFr Bld: 5.4 % (ref 4.8–5.6)

## 2024-06-29 MED ORDER — PRAVASTATIN SODIUM 40 MG PO TABS: 40.0000 mg | ORAL_TABLET | Freq: Every day | ORAL | 1 refills | Status: AC

## 2024-06-29 NOTE — Progress Notes (Signed)
 Kaitlyn Hudson    [id number]    [birth year]  Primary Care Physician:Rothfuss, Lang DASEN, PA-C  Referring Physician: Iven Lang DASEN, PA-C [address] [place],  [place] [address]  Chief complaint:   Patient being seen for concern for obstructive sleep apnea  HPI:  Was last seen here about 2020  She did used CPAP for about a year gave CPAP all during her period when she was actively taking care of spouse through an illness  She wakes up daily getting fatigued Usually goes to bed about 11 PM Might take about 1 to 2 hours to fall asleep About 1 awakening Final wake up time about 7 to 8 AM  Admits to dryness of her mouth in the mornings No morning headaches  Weight is up about 20 pounds  She does have shortness of breath with activity Does have an appointment to follow-up with cardiology  She does have a history of Sjogren's and fibromyalgia and feels fatigue may be related to this disease processes as well  Smoked a pack a day previously quit in 2012  Has a pet dog Does not feel she has allergies from dogs  She does have an early morning cough  No pertinent occupational history  Outpatient Encounter Medications as of 06/29/2024  Medication Sig   Acetaminophen (TYLENOL PO) Take by mouth as needed.   aspirin  EC 81 MG tablet Take 1 tablet (81 mg total) by mouth daily.   Calcium Carbonate-Vit D-Min (CALTRATE 600+D PLUS MINIS PO) 1,200 mg daily. 1200mg  Ca+ & unsure of D   cetirizine (ZYRTEC) 10 MG tablet Take 10 mg by mouth daily.   Esomeprazole Magnesium (NEXIUM PO) Take 40 mg by mouth daily.   fluticasone  (FLONASE ) 50 MCG/ACT nasal spray Place 1 spray into both nostrils daily.   hydroxychloroquine  (PLAQUENIL ) 200 MG tablet TAKE ONE TABLET BY MOUTH TWICE DAILY MONDAY THROUGH FRIDAY   Polyethyl Glycol-Propyl Glycol (SYSTANE OP) Apply to eye.   pravastatin (PRAVACHOL) 20 MG tablet Take 20 mg by mouth daily.   RESTASIS 0.05 % ophthalmic emulsion    No  facility-administered encounter medications on file as of 06/29/2024.    Allergies as of 06/29/2024 - Review Complete 06/29/2024  Allergen Reaction Noted   Codeine Nausea And Vomiting 09/29/2017    Past Medical History:  Diagnosis Date   Allergic rhinitis    Allergy    Angina pectoris 05/15/2018   Anxiety 04/02/2016   See comments on depression above   CAD in native artery 06/26/2018   Cataract    Chest pressure 05/11/2018   Depression    I was prescribed medication when I stopped smoking, not for depression. I have recently stopped taking it.   Dyslipidemia 04/08/2018   Emphysema lung (HCC) 06/28/2024   Exertional shortness of breath 05/11/2018   Family history of systemic lupus erythematosus (SLE) in mother 04/08/2018   Fibromyalgia    Former smoker 04/08/2018   GERD (gastroesophageal reflux disease)    History of cervical dysplasia 04/02/2016   2013; hysterectomy d/t recurrent dysplasia s/p LEEP   History of depression 03/16/2018   History of hyperlipidemia 03/16/2018   Hypercholesteremia 04/02/2016   Osteoarthritis    Pain in right hip 03/28/2022   Primary osteoarthritis of both hands 04/08/2018   Sjogren's syndrome with keratoconjunctivitis sicca 04/08/2018   +ANA, +Ro, +La, sicca symptoms, fatigue   Sleep apnea    SOB (shortness of breath) on exertion 05/11/2018   Splenomegaly 06/28/2024  Trochanteric bursitis, right hip 01/09/2018   Unilateral primary osteoarthritis, left knee 05/23/2022    Past Surgical History:  Procedure Laterality Date   ABDOMINAL HYSTERECTOMY     HAS OVARIES   CATARACT EXTRACTION, BILATERAL  07/2022   EYE SURGERY     KNEE SURGERY Right    LYMPH NODE BIOPSY     groin    NASAL SEPTUM SURGERY  12/2020    Family History  Problem Relation Age of Onset   Lupus Mother    Osteoarthritis Mother    Polymyalgia rheumatica Mother    Osteoarthritis Father    Alzheimer's disease Father    Healthy Sister    Colon cancer Brother         having surgery   Healthy Brother    Breast cancer Maternal Grandmother    Esophageal cancer Neg Hx    Rectal cancer Neg Hx    Stomach cancer Neg Hx     Social History   Socioeconomic History   Marital status: Widowed    Spouse name: Not on file   Number of children: 0   Years of education: Not on file   Highest education level: 12th grade  Occupational History   Not on file  Tobacco Use   Smoking status: Former    Current packs/day: 0.00    Average packs/day: 1 pack/day for 30.0 years (30.0 ttl pk-yrs)    Types: Cigarettes    Start date: 09/02/1980    Quit date: 09/02/2010    Years since quitting: 13.8    Passive exposure: Never   Smokeless tobacco: Never  Vaping Use   Vaping status: Never Used  Substance and Sexual Activity   Alcohol use: Yes    Alcohol/week: 1.0 standard drink of alcohol    Types: 1 Glasses of wine per week    Comment: wine maybe every 6 months   Drug use: Never   Sexual activity: Not Currently  Other Topics Concern   Not on file  Social History Narrative   Not on file   Social Drivers of Health   Financial Resource Strain: Low Risk  (06/21/2024)   Overall Financial Resource Strain (CARDIA)    Difficulty of Paying Living Expenses: Not hard at all  Food Insecurity: No Food Insecurity (06/21/2024)   Hunger Vital Sign    Worried About Running Out of Food in the Last Year: Never true    Ran Out of Food in the Last Year: Never true  Transportation Needs: No Transportation Needs (06/21/2024)   PRAPARE - Administrator, Civil Service (Medical): No    Lack of Transportation (Non-Medical): No  Physical Activity: Inactive (06/21/2024)   Exercise Vital Sign    Days of Exercise per Week: 0 days    Minutes of Exercise per Session: Not on file  Stress: No Stress Concern Present (06/21/2024)   [employer] of Occupational Health - Occupational Stress Questionnaire    Feeling of Stress: Not at all  Social Connections: Moderately Integrated  (06/21/2024)   Social Connection and Isolation Panel    Frequency of Communication with Friends and Family: More than three times a week    Frequency of Social Gatherings with Friends and Family: More than three times a week    Attends Religious Services: More than 4 times per year    Active Member of [employer] or Organizations: Yes    Attends Banker Meetings: More than 4 times per year    Marital Status: Widowed  Intimate Partner Violence: Not At Risk (05/17/2024)   Humiliation, Afraid, Rape, and Kick questionnaire    Fear of Current or Ex-Partner: No    Emotionally Abused: No    Physically Abused: No    Sexually Abused: No    Review of Systems  Constitutional:  Positive for fatigue.  Respiratory:  Positive for apnea and shortness of breath.   Psychiatric/Behavioral:  Positive for sleep disturbance.     Vitals:   06/29/24 1454  BP: 104/60  Pulse: 100  SpO2: 98%     Physical Exam Constitutional:      Appearance: She is obese.  HENT:     Head: Normocephalic.     Nose: Nose normal.     Mouth/Throat:     Mouth: Mucous membranes are moist.     Comments: Mallampati 3, crowded oropharynx Eyes:     General: No scleral icterus. Cardiovascular:     Rate and Rhythm: Normal rate and regular rhythm.     Pulses: Normal pulses.     Heart sounds: No murmur heard.    No friction rub.  Pulmonary:     Effort: No respiratory distress.     Breath sounds: No stridor. No rhonchi.  Musculoskeletal:     Cervical back: No rigidity or tenderness.  Neurological:     Mental Status: She is alert.  Psychiatric:        Mood and Affect: Mood normal.   Epworth of 3  Data Reviewed: Sleep study 05/07/2018 with an AHI of 18.8, auto CPAP was recommended  Pulmonary function test 10/01/2018 was normal  Last echocardiogram was in 2019-this was normal  Low-dose CT screening 05/28/2024 reported as lung RADS 2, emphysema, ground glass nodule left lower lobe-film was reviewed with the  patient  Assessment/Plan: History of obstructive sleep apnea - Tolerated CPAP in the past but gave CPAP up due to family issues - Wants to get reevaluated and possibly get back to using CPAP  Pathophysiology of sleep disordered breathing discussed Treatment options discussed  Lung nodule - Will require low-dose CT screenings with past history of significant smoking  Emphysema Abnormal CT scan of the chest  Fatigue - This is likely multifactorial  Class I obesity - Encouraged weight loss measures - Graded exercise as tolerated  She will be seeing a cardiologist soon - Encouraged to bring up echocardiogram  Schedule for in-lab sleep study  Schedule for pulmonary function test, can be performed on the day of the next visit  Follow-up in about 3 months   Jennet Epley MD [facility] 06/29/2024, 3:07 PM  CC: Rothfuss, Lang DASEN, PA-C

## 2024-06-29 NOTE — Patient Instructions (Addendum)
 I will see you in about 3 months  We will get a breathing study on you at the next visit  We will schedule you for sleep study  I will contact you with results of the studies  Call us  with significant concerns  Discuss having an echocardiogram with your primary doctor  Follow-up CT scan in a year to follow-up on the spot in the lung

## 2024-06-30 NOTE — Progress Notes (Unsigned)
 Cardiology Office Note:    Date:  07/01/2024   ID:  Kaitlyn Hudson, DOB December 13, 1950, MRN [id number]  PCP:  Kaitlyn Lang DASEN, PA-C  Cardiologist:  Kaitlyn Leiter, MD   Referring MD: Kaitlyn Lang DASEN, PA-C  ASSESSMENT:    1. Exertional shortness of breath   2. CAD in native artery   3. Mixed hyperlipidemia   4. Sjogren's syndrome with keratoconjunctivitis sicca   5. Chronic obstructive pulmonary disease, unspecified COPD type (HCC)    PLAN:    In order of problems listed above:  Clinical problems exertional shortness of breath differential diagnosis includes heart failure interstitial lung disease associated with a rheumatologic disorder and COPD Physical exam is not consistent with heart failure Will check a proBNP level to give direction if very low it excludes heart failure very high its diagnostic I do not think she needs a repeat ischemia evaluation not having anginal discomfort Recheck echocardiogram for pulmonary pressures RV function LV systolic and diastolic function and filling pressures. CAD is stable continue her medical therapy including aspirin  and her statin  Next appointment vascular follow-up with me in 3 months or sooner if I see abnormality on testing   Medication Adjustments/Labs and Tests Ordered: Current medicines are reviewed at length with the patient today.  Concerns regarding medicines are outlined above.  Orders Placed This Encounter  Procedures   Pro b natriuretic peptide (BNP)   EKG 12-Lead   ECHOCARDIOGRAM COMPLETE   No orders of the defined types were placed in this encounter.    No chief complaint on file.   History of Present Illness:    Kaitlyn Hudson is a 73 y.o. female with a history of CAD and abnormal CT with emphysema obstructive sleep apnea Sjogren syndrome who is being seen today for the evaluation of shortness of breath and CAD at the request of Kaitlyn Hudson, Lang DASEN, PA-C.  I had seen her in 2018 CTA showed moderate right coronary  artery and  LAD stenosis and normal FFR treated medically.  Her coronary calcium score was elevated 102 80th percentile.  Lung screening for cancer CT 05/28/2024 showed the presence of splenomegaly aortic atherosclerosis and emphysema the lungs were described as mild emphysema and diffuse bronchial thickening and bilateral pleural parenchymal scarring.  She is seen by pulmonary for lung nodule and scheduled for pulmonary function test advised cardiology follow-up.  Chart review shows medical therapy including pravastatin her most recent lipid profile with an LDL of 93 cholesterol 176 non-HDL cholesterol 139  I do not vary quite well from years of caring for her husband who is now deceased but very complex heart disease pulmonary problems, and lung cancer She has Sjogren syndrome and has had chronic shortness of breath that occurs with activities like light housework but not with ADLs and she does not have edema orthopnea chest pain palpitation or syncope He has a chronic cough and mild degree of sputum production She saw pulmonary and is pending PFTs She follows with rheumatology.  In [place] Lipid profile performed 3 days ago with cholesterol 176 LDL 93 non-HDL cholesterol 139  Past Medical History:  Diagnosis Date   Allergic rhinitis    Allergy    Angina pectoris 05/15/2018   Anxiety 04/02/2016   See comments on depression above   CAD in native artery 06/26/2018   Cataract    Chest pressure 05/11/2018   Depression    I was prescribed medication when I stopped smoking, not for depression. I have recently stopped  taking it.   Dyslipidemia 04/08/2018   Emphysema lung (HCC) 06/28/2024   Exertional shortness of breath 05/11/2018   Family history of systemic lupus erythematosus (SLE) in mother 04/08/2018   Fibromyalgia    Former smoker 04/08/2018   GERD (gastroesophageal reflux disease)    History of cervical dysplasia 04/02/2016   2013; hysterectomy d/t recurrent dysplasia s/p LEEP    History of depression 03/16/2018   History of hyperlipidemia 03/16/2018   Hypercholesteremia 04/02/2016   Osteoarthritis    Pain in right hip 03/28/2022   Primary osteoarthritis of both hands 04/08/2018   Sjogren's syndrome with keratoconjunctivitis sicca 04/08/2018   +ANA, +Ro, +La, sicca symptoms, fatigue   Sleep apnea    SOB (shortness of breath) on exertion 05/11/2018   Splenomegaly 06/28/2024   Trochanteric bursitis, right hip 01/09/2018   Unilateral primary osteoarthritis, left knee 05/23/2022    Past Surgical History:  Procedure Laterality Date   ABDOMINAL HYSTERECTOMY     HAS OVARIES   CATARACT EXTRACTION, BILATERAL  07/2022   EYE SURGERY     KNEE SURGERY Right    LYMPH NODE BIOPSY     groin    NASAL SEPTUM SURGERY  12/2020    Current Medications: Current Meds  Medication Sig   Acetaminophen (TYLENOL PO) Take by mouth as needed.   aspirin  EC 81 MG tablet Take 1 tablet (81 mg total) by mouth daily.   Calcium Carbonate-Vit D-Min (CALTRATE 600+D PLUS MINIS PO) 1,200 mg daily. 1200mg  Ca+ & unsure of D   cetirizine (ZYRTEC) 10 MG tablet Take 10 mg by mouth daily.   Esomeprazole Magnesium (NEXIUM PO) Take 40 mg by mouth daily.   fluticasone  (FLONASE ) 50 MCG/ACT nasal spray Place 1 spray into both nostrils daily.   hydroxychloroquine  (PLAQUENIL ) 200 MG tablet TAKE ONE TABLET BY MOUTH TWICE DAILY MONDAY THROUGH FRIDAY   Polyethyl Glycol-Propyl Glycol (SYSTANE OP) Apply to eye.   pravastatin (PRAVACHOL) 40 MG tablet Take 1 tablet (40 mg total) by mouth daily.   RESTASIS 0.05 % ophthalmic emulsion      Allergies:   Codeine   Social History   Socioeconomic History   Marital status: Widowed    Spouse name: Not on file   Number of children: 0   Years of education: Not on file   Highest education level: 12th grade  Occupational History   Not on file  Tobacco Use   Smoking status: Former    Current packs/day: 0.00    Average packs/day: 1 pack/day for 30.0 years (30.0  ttl pk-yrs)    Types: Cigarettes    Start date: 09/02/1980    Quit date: 09/02/2010    Years since quitting: 13.8    Passive exposure: Never   Smokeless tobacco: Never  Vaping Use   Vaping status: Never Used  Substance and Sexual Activity   Alcohol use: Not Currently    Alcohol/week: 1.0 standard drink of alcohol    Comment: wine maybe every 6 months   Drug use: Never   Sexual activity: Not Currently  Other Topics Concern   Not on file  Social History Narrative   Not on file   Social Drivers of Health   Financial Resource Strain: Low Risk  (06/21/2024)   Overall Financial Resource Strain (CARDIA)    Difficulty of Paying Living Expenses: Not hard at all  Food Insecurity: No Food Insecurity (06/21/2024)   Hunger Vital Sign    Worried About Running Out of Food in the Last Year: Never true  Ran Out of Food in the Last Year: Never true  Transportation Needs: No Transportation Needs (06/21/2024)   PRAPARE - Administrator, Civil Service (Medical): No    Lack of Transportation (Non-Medical): No  Physical Activity: Inactive (06/21/2024)   Exercise Vital Sign    Days of Exercise per Week: 0 days    Minutes of Exercise per Session: Not on file  Stress: No Stress Concern Present (06/21/2024)   [employer] of Occupational Health - Occupational Stress Questionnaire    Feeling of Stress: Not at all  Social Connections: Moderately Integrated (06/21/2024)   Social Connection and Isolation Panel    Frequency of Communication with Friends and Family: More than three times a week    Frequency of Social Gatherings with Friends and Family: More than three times a week    Attends Religious Services: More than 4 times per year    Active Member of [employer] or Organizations: Yes    Attends Banker Meetings: More than 4 times per year    Marital Status: Widowed     Family History: The patient's family history includes Alzheimer's disease in her father; Breast cancer  in her maternal grandmother; Colon cancer in her brother; Healthy in her brother and sister; Lupus in her mother; Osteoarthritis in her father and mother; Polymyalgia rheumatica in her mother. There is no history of Esophageal cancer, Rectal cancer, or Stomach cancer.  ROS:   ROS Please see the history of present illness.     All other systems reviewed and are negative.  EKGs/Labs/Other Studies Reviewed:    The following studies were reviewed today:   Cardiac Studies & Procedures   ______________________________________________________________________________________________     ECHOCARDIOGRAM  ECHOCARDIOGRAM COMPLETE 05/13/2018  Narrative *[facility]* [address] [place], [place] [address] [phone]  ------------------------------------------------------------------- Transthoracic Echocardiography  Patient:    Rosalene, Wardrop MR #:       [id number] Study Date: 05/13/2018 Gender:     F Age:        66 Height:     158.8 cm Weight:     86 kg BSA:        1.99 m^2 Pt. Status: Room:  SONOGRAPHER  Jimmy Reel, RDCS PERFORMING   [facility] [place] ATTENDING    Olalere, Adewale A ORDERING     Olalere, Adewale A REFERRING    Olalere, Adewale A  cc:  ------------------------------------------------------------------- LV EF: 55% -   60%  ------------------------------------------------------------------- Indications:      Shortness of breath 786.05.  ------------------------------------------------------------------- History:   PMH:   Chest pain.  Risk factors:  Current tobacco use. Dyslipidemia.  ------------------------------------------------------------------- Study Conclusions  - Left ventricle: The cavity size was normal. Systolic function was normal. The estimated ejection fraction was in the range of 55% to 60%. Wall motion was normal; there were no regional wall motion abnormalities. Left ventricular diastolic function parameters were  normal.  Impressions:  - Grossly normal study.  ------------------------------------------------------------------- Study data:  No prior study was available for comparison.  Study status:  Routine.  Procedure:  The patient reported no pain pre or post test. Transthoracic echocardiography. Image quality was adequate.  Study completion:  There were no complications. Transthoracic echocardiography.  M-mode, complete 2D, spectral Doppler, and color Doppler.  Birthdate:  Patient birthdate: 09-Apr-1951.  Age:  Patient is 73 yr old.  Sex:  Gender: female. BMI: 34.1 kg/m^2.  Blood pressure:     123/78  Patient status: Inpatient.  Study date:  Study date: 05/13/2018. Study time: 01:02 PM.  Location:  Echo laboratory.  -------------------------------------------------------------------  ------------------------------------------------------------------- Left ventricle:  The cavity size was normal. Systolic function was normal. The estimated ejection fraction was in the range of 55% to 60%. Wall motion was normal; there were no regional wall motion abnormalities. The transmitral flow pattern was normal. The deceleration time of the early transmitral flow velocity was normal. The pulmonary vein flow pattern was normal. The tissue Doppler parameters were normal. Left ventricular diastolic function parameters were normal.  ------------------------------------------------------------------- Aortic valve:   Trileaflet; normal thickness leaflets. Mobility was not restricted.  Doppler:  Transvalvular velocity was within the normal range. There was no stenosis. There was no regurgitation.  ------------------------------------------------------------------- Aorta:  Aortic root: The aortic root was normal in size.  ------------------------------------------------------------------- Mitral valve:   Structurally normal valve.   Mobility was not restricted.  Doppler:  Transvalvular velocity was  within the normal range. There was no evidence for stenosis. There was no regurgitation.    Valve area by pressure half-time: 5.37 cm^2. Indexed valve area by pressure half-time: 2.71 cm^2/m^2.    Peak gradient (D): 5 mm Hg.  ------------------------------------------------------------------- Left atrium:  The atrium was normal in size.  ------------------------------------------------------------------- Right ventricle:  The cavity size was normal. Wall thickness was normal. Systolic function was normal.  ------------------------------------------------------------------- Pulmonic valve:    Doppler:  Transvalvular velocity was within the normal range. There was no evidence for stenosis.  ------------------------------------------------------------------- Tricuspid valve:   Structurally normal valve.    Doppler: Transvalvular velocity was within the normal range. There was mild regurgitation.  ------------------------------------------------------------------- Pulmonary artery:   The main pulmonary artery was normal-sized. Systolic pressure was within the normal range.  ------------------------------------------------------------------- Right atrium:  The atrium was normal in size.  ------------------------------------------------------------------- Pericardium:  There was no pericardial effusion.  ------------------------------------------------------------------- Systemic veins: Inferior vena cava: The vessel was normal in size.  ------------------------------------------------------------------- Measurements  Left ventricle                         Value          Reference LV ID, ED, PLAX chordal                44    mm       43 - 52 LV ID, ES, PLAX chordal                27    mm       23 - 38 LV fx shortening, PLAX chordal         39    %        >=29 LV PW thickness, ED                    9     mm       ---------- IVS/LV PW ratio, ED                    1.11            <=1.3 Stroke volume, 2D                      72    ml       ---------- Stroke volume/bsa, 2D                  36    ml/m^2   ---------- LV e&', lateral  10    cm/s     ---------- LV E/e&', lateral                       11.3           ---------- LV e&', medial                          11.7  cm/s     ---------- LV E/e&', medial                        9.66           ---------- LV e&', average                         10.85 cm/s     ---------- LV E/e&', average                       10.41          ----------  Ventricular septum                     Value          Reference IVS thickness, ED                      10    mm       ----------  LVOT                                   Value          Reference LVOT ID, S                             19    mm       ---------- LVOT area                              2.84  cm^2     ---------- LVOT peak velocity, S                  110   cm/s     ---------- LVOT mean velocity, S                  68.5  cm/s     ---------- LVOT VTI, S                            25.5  cm       ---------- LVOT peak gradient, S                  5     mm Hg    ----------  Aorta                                  Value          Reference Aortic root ID, ED                     28    mm       ----------  Ascending aorta ID, A-P, S             29    mm       ----------  Left atrium                            Value          Reference LA ID, A-P, ES                         32    mm       ---------- LA ID/bsa, A-P                         1.61  cm/m^2   <=2.2 LA volume, S                           35.9  ml       ---------- LA volume/bsa, S                       18.1  ml/m^2   ---------- LA volume, ES, 1-p A4C                 31.1  ml       ---------- LA volume/bsa, ES, 1-p A4C             15.7  ml/m^2   ---------- LA volume, ES, 1-p A2C                 38.1  ml       ---------- LA volume/bsa, ES, 1-p A2C             19.2  ml/m^2   ----------  Mitral valve                            Value          Reference Mitral E-wave peak velocity            113   cm/s     ---------- Mitral A-wave peak velocity            51.4  cm/s     ---------- Mitral deceleration time       (L)     141   ms       150 - 230 Mitral pressure half-time              41    ms       ---------- Mitral peak gradient, D                5     mm Hg    ---------- Mitral E/A ratio, peak                 2.2            ---------- Mitral valve area, PHT, DP             5.37  cm^2     ---------- Mitral valve area/bsa, PHT, DP         2.71  cm^2/m^2 ----------  Pulmonary arteries                     Value  Reference PA pressure, S, DP                     28    mm Hg    <=30  Tricuspid valve                        Value          Reference Tricuspid regurg peak velocity         252   cm/s     ---------- Tricuspid peak RV-RA gradient          25    mm Hg    ----------  Right atrium                           Value          Reference RA ID, S-I, ES, A4C            (H)     54    mm       34 - 49 RA area, ES, A4C                       15.9  cm^2     8.3 - 19.5 RA volume, ES, A/L                     40.5  ml       ---------- RA volume/bsa, ES, A/L                 20.4  ml/m^2   ----------  Systemic veins                         Value          Reference Estimated CVP                          3     mm Hg    ----------  Right ventricle                        Value          Reference TAPSE                                  20.3  mm       ---------- RV pressure, S, DP                     28    mm Hg    <=30 RV s&', lateral, S                      10.4  cm/s     ----------  Legend: (L)  and  (H)  mark values outside specified reference range.  ------------------------------------------------------------------- Prepared and Electronically Authenticated by  Lamar Fitch, MD 2019-09-11T17:15:24      CT SCANS  CT CORONARY FRACTIONAL FLOW RESERVE DATA PREP  06/15/2018  Narrative EXAM: CT FFR ANALYSIS  CLINICAL DATA:  Reason for exam: angina pectoris  FINDINGS: FFRct analysis was performed on the original cardiac CT angiogram dataset. Diagrammatic representation of the FFRct analysis is provided in a separate PDF document in PACS. This dictation was  created using the PDF document and an interactive 3D model of the results. 3D model is not available in the EMR/PACS. Normal FFR range is >0.80.  1. Left Main:  No significant stenosis.  FFR = 0.94  2. LAD: Ostial LAD FFR =0.92, proximal LAD FFR = 0.92, mid LAD FFR = 0.84. There is distal LAD disease with FFR = 0.7 3. LCX: No significant stenosis. FFR proximal circumflex = 0.96, distal circumflex 0.94 4. RCA: No significant stenosis. Proximal RCA FFR 0.97, mid RCA FFR = 0.92, distal RCA FFR = 0.86  IMPRESSION: 1. CT FFR analysis showed no significant mid LAD stenosis with FFR = 0.84. Diffuse distal LAD disease with terminal LAD FFR = 0.7. No significant stenosis in the RCA or Left Circumflex arteries.   Electronically Signed By: Soyla Merck On: 06/23/2018 13:44   CT SCANS  CT CORONARY MORPH W/CTA COR W/SCORE 06/11/2018  Addendum 06/16/2018  8:05 AM ADDENDUM REPORT: 06/14/2018 18:27  EXAM: Cardiac/Coronary  CT  TECHNIQUE: The patient was scanned on a [employer].  PROTOCOL: A 120 kV prospective scan was triggered in the descending thoracic aorta at 111 HU's. Axial non-contrast 3 mm slices were carried out through the heart. The data set was analyzed on a dedicated work station and scored using the Agatson method. Gantry rotation speed was 250 msecs and collimation was .6 mm. No beta blockade and 0.8 mg of sl NTG was given. The 3D data set was reconstructed in 5% intervals of the 67-82 % of the R-R cycle. Diastolic phases were analyzed on a dedicated work station using MPR, MIP and VRT modes. The patient received 80 cc of contrast.  FINDINGS: Coronary  calcium score: The coronary calcium score was 202, which places the patient in the 88th percentile. This number is greater than expected as compared to age and gender matched peers.  Coronary arteries: Normal coronary origin.  Right dominance.  Right Coronary Artery: Mild mixed atherosclerosis throughout the RCA (25-49 percent stenosis.) There is a moderate mixed atherosclerotic lesion in the mid RCA (50-69 percent stenosis). Patent small caliber PDA and posterolateral branches with scattered atherosclerosis.  Left Main Coronary Artery: Minimal ostial left main coronary artery disease (<25% stenosis).  Left Anterior Descending Coronary Artery: Mild ostial LAD mixed atherosclerosis (25-49% stenosis), mild proximal LAD noncalcified atherosclerosis (25-49% stenosis) just proximal to the takeoff of the first septal perforator. There is a moderate mixed atherosclerotic lesion in the mid LAD (50-69 % stenosis). Mild scattered mixed atherosclerosis in the distal LAD (25-49% stenosis). Patent diagonal branches.  Left Circumflex Artery: Mild mixed atherosclerosis in the proximal and early portion of the distal left circumflex artery. There is a prominent calcification just proximal to the takeoff of the first obtuse marginal branch, likely creating mild stenosis (25-49 percent stenosis). The OM 1 is patent.  Aorta:  Normal size.  No calcifications.  No dissection.  Aortic Valve: Trileaflet. Mild calcifications at the aortic annulus.  Other findings:  Normal pulmonary vein drainage into the left atrium.  Normal let atrial appendage without a thrombus.  The region of interest on this exam does not allow for complete visualization of the pulmonary artery for size nor the mid ascending aorta.  IMPRESSION: 1. Coronary calcium score of 202. This was 88th percentile for age and sex matched control.  2. Normal coronary origin with right dominance.  3. Moderate coronary artery disease  in the mid RCA and mid LAD, 50-69% stenosis with mixed plaque. CADRADS = 3.  Gayatri  Loni   Electronically Signed By: Soyla Loni On: 06/14/2018 18:27  Narrative EXAM: OVER-READ INTERPRETATION  CT CHEST  The following report is an over-read performed by radiologist Dr. Toribio Aye of [facility] on 06/11/2018. This over-read does not include interpretation of cardiac or coronary anatomy or pathology. The coronary calcium score/coronary CTA interpretation by the cardiologist is attached.  COMPARISON:  None.  FINDINGS: Within the visualized portions of the thorax there are no suspicious appearing pulmonary nodules or masses, there is no acute consolidative airspace disease, no pleural effusions, no pneumothorax and no lymphadenopathy. Visualized portions of the upper abdomen are unremarkable. There are no aggressive appearing lytic or blastic lesions noted in the visualized portions of the skeleton.  IMPRESSION: No significant incidental noncardiac findings are noted.  Electronically Signed: By: Toribio Aye M.D. On: 06/11/2018 15:17     ______________________________________________________________________________________________      EKG Interpretation Date/Time:  Thursday July 01 2024 13:41:13 EDT Ventricular Rate:  103 PR Interval:  154 QRS Duration:  82 QT Interval:  328 QTC Calculation: 429 R Axis:   55  Text Interpretation: Sinus tachycardia No previous ECGs available Confirmed by Monetta Rogue ([id number]) on 07/01/2024 1:57:59 PM    Recent Labs: 04/26/2024: ALT 7; BUN 15; Creat 0.70; Hemoglobin 12.5; Platelets 232; Potassium 4.2; Sodium 135  Recent Lipid Panel    Component Value Date/Time   CHOL 176 06/28/2024 1000   TRIG 271 (H) 06/28/2024 1000   HDL 37 (L) 06/28/2024 1000   CHOLHDL 4.8 (H) 06/28/2024 1000   LDLCALC 93 06/28/2024 1000    Physical Exam:    VS:  BP (!) 118/56   Pulse (!) 103   Ht 5' 2 (1.575 m)    Wt 190 lb 12.8 oz (86.5 kg)   SpO2 96%   BMI 34.90 kg/m     Wt Readings from Last 3 Encounters:  07/01/24 190 lb 12.8 oz (86.5 kg)  06/29/24 190 lb (86.2 kg)  06/28/24 190 lb 8 oz (86.4 kg)     GEN:  Well nourished, well developed in no acute distress HEENT: Normal NECK: No JVD; No carotid bruits LYMPHATICS: No lymphadenopathy CARDIAC: RRR, no murmurs, rubs, gallops RESPIRATORY:  Clear to auscultation without rales, wheezing or rhonchi  ABDOMEN: Soft, non-tender, non-distended MUSCULOSKELETAL:  No edema; No deformity  SKIN: Warm and dry NEUROLOGIC:  Alert and oriented x 3 PSYCHIATRIC:  Normal affect     Signed, Rogue Monetta, MD  07/01/2024 4:23 PM    [facility]

## 2024-07-01 ENCOUNTER — Ambulatory Visit: Attending: Cardiology | Admitting: Cardiology

## 2024-07-01 ENCOUNTER — Encounter: Payer: Self-pay | Admitting: Cardiology

## 2024-07-01 ENCOUNTER — Inpatient Hospital Stay (HOSPITAL_BASED_OUTPATIENT_CLINIC_OR_DEPARTMENT_OTHER): Admission: RE | Admit: 2024-07-01 | Discharge: 2024-07-01 | Attending: Student | Admitting: Radiology

## 2024-07-01 VITALS — BP 118/56 | HR 103 | Ht 62.0 in | Wt 190.8 lb

## 2024-07-01 DIAGNOSIS — R0602 Shortness of breath: Secondary | ICD-10-CM

## 2024-07-01 DIAGNOSIS — I2510 Atherosclerotic heart disease of native coronary artery without angina pectoris: Secondary | ICD-10-CM

## 2024-07-01 DIAGNOSIS — R161 Splenomegaly, not elsewhere classified: Secondary | ICD-10-CM

## 2024-07-01 DIAGNOSIS — M3501 Sicca syndrome with keratoconjunctivitis: Secondary | ICD-10-CM | POA: Diagnosis not present

## 2024-07-01 DIAGNOSIS — E782 Mixed hyperlipidemia: Secondary | ICD-10-CM | POA: Diagnosis not present

## 2024-07-01 DIAGNOSIS — J449 Chronic obstructive pulmonary disease, unspecified: Secondary | ICD-10-CM

## 2024-07-01 NOTE — Patient Instructions (Signed)
 Medication Instructions:  None *If you need a refill on your cardiac medications before your next appointment, please call your pharmacy*  Lab Work: Your physician recommends that you return for lab work in:   Labs today: Pro BNP  If you have labs (blood work) drawn today and your tests are completely normal, you will receive your results only by: MyChart Message (if you have MyChart) OR A paper copy in the mail If you have any lab test that is abnormal or we need to change your treatment, we will call you to review the results.  Testing/Procedures: Your physician has requested that you have an echocardiogram. Echocardiography is a painless test that uses sound waves to create images of your heart. It provides your doctor with information about the size and shape of your heart and how well your heart's chambers and valves are working. This procedure takes approximately one hour. There are no restrictions for this procedure. Please do NOT wear cologne, perfume, aftershave, or lotions (deodorant is allowed). Please arrive 15 minutes prior to your appointment time.  Please note: We ask at that you not bring children with you during ultrasound (echo/ vascular) testing. Due to room size and safety concerns, children are not allowed in the ultrasound rooms during exams. Our front office staff cannot provide observation of children in our lobby area while testing is being conducted. An adult accompanying a patient to their appointment will only be allowed in the ultrasound room at the discretion of the ultrasound technician under special circumstances. We apologize for any inconvenience.   Follow-Up: At [facility], you and your health needs are our priority.  As part of our continuing mission to provide you with exceptional heart care, our providers are all part of one team.  This team includes your primary Cardiologist (physician) and Advanced Practice Providers or APPs (Physician  Assistants and Nurse Practitioners) who all work together to provide you with the care you need, when you need it.  Your next appointment:   3 month(s)  Provider:   Redell Leiter, MD    We recommend signing up for the patient portal called MyChart.  Sign up information is provided on this After Visit Summary.  MyChart is used to connect with patients for Virtual Visits (Telemedicine).  Patients are able to view lab/test results, encounter notes, upcoming appointments, etc.  Non-urgent messages can be sent to your provider as well.   To learn more about what you can do with MyChart, go to [email or web address].   Other Instructions None

## 2024-07-02 LAB — PRO B NATRIURETIC PEPTIDE: NT-Pro BNP: 192 pg/mL (ref 0–301)

## 2024-07-05 ENCOUNTER — Ambulatory Visit: Payer: Self-pay

## 2024-07-05 ENCOUNTER — Encounter: Payer: Self-pay | Admitting: Radiology

## 2024-07-05 ENCOUNTER — Ambulatory Visit (HOSPITAL_BASED_OUTPATIENT_CLINIC_OR_DEPARTMENT_OTHER): Payer: Self-pay | Admitting: Student

## 2024-07-06 ENCOUNTER — Other Ambulatory Visit: Payer: Self-pay

## 2024-07-06 ENCOUNTER — Encounter: Payer: Self-pay | Admitting: Sports Medicine

## 2024-07-06 ENCOUNTER — Ambulatory Visit: Admitting: Sports Medicine

## 2024-07-06 DIAGNOSIS — M25551 Pain in right hip: Secondary | ICD-10-CM | POA: Diagnosis not present

## 2024-07-06 DIAGNOSIS — M1611 Unilateral primary osteoarthritis, right hip: Secondary | ICD-10-CM | POA: Diagnosis not present

## 2024-07-06 DIAGNOSIS — G8929 Other chronic pain: Secondary | ICD-10-CM

## 2024-07-06 MED ORDER — METHYLPREDNISOLONE ACETATE 40 MG/ML IJ SUSP
80.0000 mg | INTRAMUSCULAR | Status: AC | PRN
  Administered 2024-07-06: 80 mg via INTRA_ARTICULAR

## 2024-07-06 MED ORDER — LIDOCAINE HCL 1 % IJ SOLN
4.0000 mL | INTRAMUSCULAR | Status: AC | PRN
  Administered 2024-07-06: 4 mL

## 2024-07-06 NOTE — Progress Notes (Signed)
 Patient says that she is having equal pain in the groin as she is the side of the hip. She does have surgery scheduled for February 10th, and would like to get relief in the time leading up to that surgery. She is here today for right hip injection. She does have an appointment scheduled to have an injection for the greater trochanter, as well.

## 2024-07-06 NOTE — Progress Notes (Signed)
 Kaitlyn Hudson - 73 y.o. female MRN [id number]  Date of birth: 08-Nov-1950  Office Visit Note: Visit Date: 07/06/2024 PCP: Iven Lang DASEN, PA-C Referred by: Iven Lang DASEN, PA-C  Subjective: Chief Complaint  Patient presents with   Right Hip - Pain   HPI: Kaitlyn Hudson is a pleasant 73 y.o. female who presents today for follow-up of acute on chronic right hip pain with known advanced osteoarthritis.  Kaitlyn Hudson has been having quite a bit of pain in the hip.  There is a pain over the anterior hip and in the groin.  She also is experiencing pain on the side of the hip.  She has discussed with Dr. Vernetta and does have her right hip total arthroplasty surgery scheduled for October 12, 2024.  She is interested in proceeding with injection to get her through the holidays and give her pain relief as she has received in the past.  She also would like to consider greater trochanteric injection if her lateral-based hip pain does not improve with IA-injection.  She is using Tylenol as needed to help with pain.  Lab Results  Component Value Date   HGBA1C 5.4 06/28/2024   Pertinent ROS were reviewed with the patient and found to be negative unless otherwise specified above in HPI.   Assessment & Plan: Visit Diagnoses:  1. Unilateral primary osteoarthritis, right hip   2. Chronic right hip pain    Plan: Impression is acute on chronic right hip pain with advanced osteoarthritis with current exacerbation.  She also is having some pain on the lateral aspect of the hip in the setting of GTPS.  She is scheduled for right hip replacement in February 2026, but would like some pain relief up until that point as she has found benefit from previous injections.  Through shared decision making, we did proceed with ultrasound-guided intra-articular injection, patient tolerated well.  Discussed postinjection protocol.  May use ice/heat as well as Tylenol for pain control going forward.  We will see how she does, if  she still has lateral based hip pain, may consider GT-injection in coming weeks.   Meds & Orders: No orders of the defined types were placed in this encounter.   Orders Placed This Encounter  Procedures   Large Joint Inj   US  Guided Needle Placement - No Linked Charges     Procedures: Large Joint Inj: R hip joint on 07/06/2024 9:55 AM Indications: pain Details: 22 G 3.5 in needle, ultrasound-guided anterior approach Medications: 4 mL lidocaine  1 %; 80 mg methylPREDNISolone  acetate 40 MG/ML Outcome: tolerated well, no immediate complications  Procedure: US -guided intra-articular hip injection, Right After discussion on risks/benefits/indications and informed verbal consent was obtained, a timeout was performed. Patient was lying supine on exam table. The hip was cleaned with betadine and alcohol swabs. Then utilizing ultrasound guidance, the patient's femoral head and neck junction was identified and subsequently injected with 4:2 lidocaine :depomedrol via an in-plane approach with ultrasound visualization of the injectate administered into the hip joint. Patient tolerated procedure well without immediate complications.  Procedure, treatment alternatives, risks and benefits explained, specific risks discussed. Consent was given by the patient. Immediately prior to procedure a time out was called to verify the correct patient, procedure, equipment, support staff and site/side marked as required. Patient was prepped and draped in the usual sterile fashion.          Clinical History: No specialty comments available.  She reports that she quit smoking about 13 years  ago. Her smoking use included cigarettes. She started smoking about 43 years ago. She has a 30 pack-year smoking history. She has never been exposed to tobacco smoke. She has never used smokeless tobacco.  Recent Labs    06/28/24 1000  HGBA1C 5.4    Objective:    Physical Exam  Gen: Well-appearing, in no acute distress;  non-toxic CV: Well-perfused. Warm.  Resp: Breathing unlabored on room air; no wheezing. Psych: Fluid speech in conversation; appropriate affect; normal thought process  Ortho Exam - Right hip: No redness swelling or effusion.  There is limited range of motion with internal and external logroll.  Positive pain with hip flexion and Stinchfield testing.  Imaging:  11/11/23: 2 views of the right hip including AP pelvis and right hip lateral film  were ordered and reviewed by myself today.  X-rays demonstrate MR head  well-seated within the lung.  The superior lateral aspect of the joint is  relatively well-preserved but there is advanced arthritic change with near  bone-on-bone change over the inferior medial aspect of the joint with bony  sclerosis of the inferior acetabulum.   Past Medical/Family/Surgical/Social History: Medications & Allergies reviewed per EMR, new medications updated. Patient Active Problem List   Diagnosis Date Noted   Osteoarthritis    GERD (gastroesophageal reflux disease)    Depression    Cataract    Allergy    Allergic rhinitis    Splenomegaly 06/28/2024   Emphysema lung (HCC) 06/28/2024   Unilateral primary osteoarthritis, left knee 05/23/2022   Pain in right hip 03/28/2022   Trochanteric bursitis, right hip 03/28/2022   CAD in native artery 06/26/2018   Sleep apnea 05/15/2018   Angina pectoris 05/15/2018   SOB (shortness of breath) on exertion 05/11/2018   Chest pressure 05/11/2018   Fibromyalgia 05/11/2018   Exertional shortness of breath 05/11/2018   Sjogren's syndrome with keratoconjunctivitis sicca 04/08/2018   Former smoker 04/08/2018   Family history of systemic lupus erythematosus (SLE) in mother 04/08/2018   Dyslipidemia 04/08/2018   Primary osteoarthritis of both hands 04/08/2018   History of depression 03/16/2018   History of hyperlipidemia 03/16/2018   Anxiety 04/02/2016   History of cervical dysplasia 04/02/2016   Hypercholesteremia  04/02/2016   Past Medical History:  Diagnosis Date   Allergic rhinitis    Allergy    Angina pectoris 05/15/2018   Anxiety 04/02/2016   See comments on depression above   CAD in native artery 06/26/2018   Cataract    Chest pressure 05/11/2018   Depression    I was prescribed medication when I stopped smoking, not for depression. I have recently stopped taking it.   Dyslipidemia 04/08/2018   Emphysema lung (HCC) 06/28/2024   Exertional shortness of breath 05/11/2018   Family history of systemic lupus erythematosus (SLE) in mother 04/08/2018   Fibromyalgia    Former smoker 04/08/2018   GERD (gastroesophageal reflux disease)    History of cervical dysplasia 04/02/2016   2013; hysterectomy d/t recurrent dysplasia s/p LEEP   History of depression 03/16/2018   History of hyperlipidemia 03/16/2018   Hypercholesteremia 04/02/2016   Osteoarthritis    Pain in right hip 03/28/2022   Primary osteoarthritis of both hands 04/08/2018   Sjogren's syndrome with keratoconjunctivitis sicca 04/08/2018   +ANA, +Ro, +La, sicca symptoms, fatigue   Sleep apnea    SOB (shortness of breath) on exertion 05/11/2018   Splenomegaly 06/28/2024   Trochanteric bursitis, right hip 01/09/2018   Unilateral primary osteoarthritis, left knee 05/23/2022  Family History  Problem Relation Age of Onset   Lupus Mother    Osteoarthritis Mother    Polymyalgia rheumatica Mother    Osteoarthritis Father    Alzheimer's disease Father    Healthy Sister    Colon cancer Brother        having surgery   Healthy Brother    Breast cancer Maternal Grandmother    Esophageal cancer Neg Hx    Rectal cancer Neg Hx    Stomach cancer Neg Hx    Past Surgical History:  Procedure Laterality Date   ABDOMINAL HYSTERECTOMY     HAS OVARIES   CATARACT EXTRACTION, BILATERAL  07/2022   EYE SURGERY     KNEE SURGERY Right    LYMPH NODE BIOPSY     groin    NASAL SEPTUM SURGERY  12/2020   Social History   Occupational History    Not on file  Tobacco Use   Smoking status: Former    Current packs/day: 0.00    Average packs/day: 1 pack/day for 30.0 years (30.0 ttl pk-yrs)    Types: Cigarettes    Start date: 09/02/1980    Quit date: 09/02/2010    Years since quitting: 13.8    Passive exposure: Never   Smokeless tobacco: Never  Vaping Use   Vaping status: Never Used  Substance and Sexual Activity   Alcohol use: Not Currently    Alcohol/week: 1.0 standard drink of alcohol    Comment: wine maybe every 6 months   Drug use: Never   Sexual activity: Not Currently

## 2024-07-12 ENCOUNTER — Ambulatory Visit (HOSPITAL_BASED_OUTPATIENT_CLINIC_OR_DEPARTMENT_OTHER)

## 2024-07-12 ENCOUNTER — Encounter (HOSPITAL_BASED_OUTPATIENT_CLINIC_OR_DEPARTMENT_OTHER): Payer: Self-pay

## 2024-07-13 ENCOUNTER — Ambulatory Visit: Admitting: Sports Medicine

## 2024-07-14 ENCOUNTER — Ambulatory Visit: Admitting: Sports Medicine

## 2024-07-14 ENCOUNTER — Other Ambulatory Visit: Payer: Self-pay

## 2024-07-14 ENCOUNTER — Encounter: Payer: Self-pay | Admitting: Sports Medicine

## 2024-07-14 DIAGNOSIS — M7061 Trochanteric bursitis, right hip: Secondary | ICD-10-CM | POA: Diagnosis not present

## 2024-07-14 DIAGNOSIS — M25551 Pain in right hip: Secondary | ICD-10-CM | POA: Diagnosis not present

## 2024-07-14 DIAGNOSIS — M1611 Unilateral primary osteoarthritis, right hip: Secondary | ICD-10-CM | POA: Diagnosis not present

## 2024-07-14 DIAGNOSIS — G8929 Other chronic pain: Secondary | ICD-10-CM | POA: Diagnosis not present

## 2024-07-14 MED ORDER — BUPIVACAINE HCL 0.25 % IJ SOLN
2.0000 mL | INTRAMUSCULAR | Status: AC | PRN
  Administered 2024-07-14: 2 mL via INTRA_ARTICULAR

## 2024-07-14 MED ORDER — LIDOCAINE HCL 1 % IJ SOLN
2.0000 mL | INTRAMUSCULAR | Status: AC | PRN
  Administered 2024-07-14: 2 mL

## 2024-07-14 MED ORDER — BETAMETHASONE SOD PHOS & ACET 6 (3-3) MG/ML IJ SUSP
6.0000 mg | INTRAMUSCULAR | Status: AC | PRN
  Administered 2024-07-14: 6 mg via INTRA_ARTICULAR

## 2024-07-14 NOTE — Progress Notes (Signed)
 Patient says that she has noticed improvement in the right hip since her injection last week. She is here today for greater trochanteric injection for the right hip.

## 2024-07-14 NOTE — Progress Notes (Signed)
 Kaitlyn Hudson - 73 y.o. female MRN [id number]  Date of birth: 1951/06/03  Office Visit Note: Visit Date: 07/14/2024 PCP: Iven Lang DASEN, PA-C Referred by: Iven Lang DASEN, PA-C  Subjective: Chief Complaint  Patient presents with   Right Hip - Pain   HPI: Kaitlyn Hudson is a pleasant 73 y.o. female who presents today for chronic right hip pain.  [name] has known advanced hip osteoarthritis.  Back earlier in the month we did perform ultrasound-guided intra-articular hip injection which has significantly helped her hip arthritis and groin pain.  She is still experiencing some pain over the lateral aspect of the hip, is wishing to proceed with injection today.  She is using Tylenol as needed for her pain.  She is getting scheduled for right total hip arthroplasty with Dr. Vernetta upcoming in February 2026.  Pertinent ROS were reviewed with the patient and found to be negative unless otherwise specified above in HPI.   Assessment & Plan: Visit Diagnoses:  1. Chronic right hip pain   2. Unilateral primary osteoarthritis, right hip   3. Trochanteric bursitis, right hip    Plan: Impression is chronic hip pain which is twofold given her advanced osteoarthritis which has done very well from previous intra-articular hip injection.  She is scheduled for total hip replacement with Dr. Vernetta upcoming in February 2026.  She will continue remaining active and mobile without the hip until this time.  Her intra-articular hip is feeling much better after the injection, but still having residual pain over the lateral hip with a mild degree of trochanteric bursitis.  Through shared decision making, we did proceed with ultrasound-guided trochanteric bursa injection, patient tolerated well.  Advised on postinjection protocol.  May use ice/heat and/or Tylenol for any pain going forward.  I will see her back as needed.  Follow-up: Return if symptoms worsen or fail to improve.   Meds & Orders: No orders of  the defined types were placed in this encounter.   Orders Placed This Encounter  Procedures   US  Guided Needle Placement - No Linked Charges     Procedures: Large Joint Inj: R greater trochanter on 07/14/2024 9:20 AM Indications: pain Details: 22 G 3.5 in needle, ultrasound-guided lateral approach Medications: 2 mL lidocaine  1 %; 2 mL bupivacaine  0.25 %; 6 mg betamethasone  acetate-betamethasone  sodium phosphate 6 (3-3) MG/ML Outcome: tolerated well, no immediate complications  US -Guided Greater Trochanteric Bursa Injection, Right After discussion on risks/benefits/indications and informed verbal consent was obtained, a timeout was performed. The patient was lying in lateral recumbent position on exam table. Using ultrasound guidance, the greater trochanter was identified. The area overlying the trochanteric bursa was then prepped with Betadine and alcohol swabs. Following sterile precautions, ultrasound was reapplied to visualize needle guidance with a 22-gauge 3.5 needle utilizing an in-plane approach to inject the bursa with 2:2:1 lidocaine :bupivicaine:betamethasone . Delivery of the injectate was visualized into the region of hypoechoic fluid of the greater trochanteric bursa. Patient tolerated procedure well without immediate complications.    Procedure, treatment alternatives, risks and benefits explained, specific risks discussed. Consent was given by the patient. Immediately prior to procedure a time out was called to verify the correct patient, procedure, equipment, support staff and site/side marked as required. Patient was prepped and draped in the usual sterile fashion.          Clinical History: No specialty comments available.  She reports that she quit smoking about 13 years ago. Her smoking use included cigarettes. She started  smoking about 43 years ago. She has a 30 pack-year smoking history. She has never been exposed to tobacco smoke. She has never used smokeless tobacco.   Recent Labs    06/28/24 1000  HGBA1C 5.4    Objective:    Physical Exam  Gen: Well-appearing, in no acute distress; non-toxic CV: Well-perfused. Warm.  Resp: Breathing unlabored on room air; no wheezing. Psych: Fluid speech in conversation; appropriate affect; normal thought process  Ortho Exam - Right hip: Positive TTP over the greater trochanteric region, there is a mild degree of soft tissue swelling in this location.  There is restriction about the hip joint in all planes 2/2 to her OA but non-painful today.  Imaging: No results found.  Past Medical/Family/Surgical/Social History: Medications & Allergies reviewed per EMR, new medications updated. Patient Active Problem List   Diagnosis Date Noted   Osteoarthritis    GERD (gastroesophageal reflux disease)    Depression    Cataract    Allergy    Allergic rhinitis    Splenomegaly 06/28/2024   Emphysema lung (HCC) 06/28/2024   Unilateral primary osteoarthritis, left knee 05/23/2022   Pain in right hip 03/28/2022   Trochanteric bursitis, right hip 03/28/2022   CAD in native artery 06/26/2018   Sleep apnea 05/15/2018   Angina pectoris 05/15/2018   SOB (shortness of breath) on exertion 05/11/2018   Chest pressure 05/11/2018   Fibromyalgia 05/11/2018   Exertional shortness of breath 05/11/2018   Sjogren's syndrome with keratoconjunctivitis sicca 04/08/2018   Former smoker 04/08/2018   Family history of systemic lupus erythematosus (SLE) in mother 04/08/2018   Dyslipidemia 04/08/2018   Primary osteoarthritis of both hands 04/08/2018   History of depression 03/16/2018   History of hyperlipidemia 03/16/2018   Anxiety 04/02/2016   History of cervical dysplasia 04/02/2016   Hypercholesteremia 04/02/2016   Past Medical History:  Diagnosis Date   Allergic rhinitis    Allergy    Angina pectoris 05/15/2018   Anxiety 04/02/2016   See comments on depression above   CAD in native artery 06/26/2018   Cataract    Chest  pressure 05/11/2018   Depression    I was prescribed medication when I stopped smoking, not for depression. I have recently stopped taking it.   Dyslipidemia 04/08/2018   Emphysema lung (HCC) 06/28/2024   Exertional shortness of breath 05/11/2018   Family history of systemic lupus erythematosus (SLE) in mother 04/08/2018   Fibromyalgia    Former smoker 04/08/2018   GERD (gastroesophageal reflux disease)    History of cervical dysplasia 04/02/2016   2013; hysterectomy d/t recurrent dysplasia s/p LEEP   History of depression 03/16/2018   History of hyperlipidemia 03/16/2018   Hypercholesteremia 04/02/2016   Osteoarthritis    Pain in right hip 03/28/2022   Primary osteoarthritis of both hands 04/08/2018   Sjogren's syndrome with keratoconjunctivitis sicca 04/08/2018   +ANA, +Ro, +La, sicca symptoms, fatigue   Sleep apnea    SOB (shortness of breath) on exertion 05/11/2018   Splenomegaly 06/28/2024   Trochanteric bursitis, right hip 01/09/2018   Unilateral primary osteoarthritis, left knee 05/23/2022   Family History  Problem Relation Age of Onset   Lupus Mother    Osteoarthritis Mother    Polymyalgia rheumatica Mother    Osteoarthritis Father    Alzheimer's disease Father    Healthy Sister    Colon cancer Brother        having surgery   Healthy Brother    Breast cancer Maternal Grandmother  Esophageal cancer Neg Hx    Rectal cancer Neg Hx    Stomach cancer Neg Hx    Past Surgical History:  Procedure Laterality Date   ABDOMINAL HYSTERECTOMY     HAS OVARIES   CATARACT EXTRACTION, BILATERAL  07/2022   EYE SURGERY     KNEE SURGERY Right    LYMPH NODE BIOPSY     groin    NASAL SEPTUM SURGERY  12/2020   Social History   Occupational History   Not on file  Tobacco Use   Smoking status: Former    Current packs/day: 0.00    Average packs/day: 1 pack/day for 30.0 years (30.0 ttl pk-yrs)    Types: Cigarettes    Start date: 09/02/1980    Quit date: 09/02/2010    Years  since quitting: 13.8    Passive exposure: Never   Smokeless tobacco: Never  Vaping Use   Vaping status: Never Used  Substance and Sexual Activity   Alcohol use: Not Currently    Alcohol/week: 1.0 standard drink of alcohol    Comment: wine maybe every 6 months   Drug use: Never   Sexual activity: Not Currently

## 2024-07-15 ENCOUNTER — Ambulatory Visit (INDEPENDENT_AMBULATORY_CARE_PROVIDER_SITE_OTHER)

## 2024-07-15 ENCOUNTER — Encounter (HOSPITAL_BASED_OUTPATIENT_CLINIC_OR_DEPARTMENT_OTHER): Payer: Self-pay

## 2024-07-15 ENCOUNTER — Encounter (HOSPITAL_BASED_OUTPATIENT_CLINIC_OR_DEPARTMENT_OTHER): Payer: Self-pay | Admitting: Student

## 2024-07-15 DIAGNOSIS — Z0000 Encounter for general adult medical examination without abnormal findings: Secondary | ICD-10-CM

## 2024-07-15 NOTE — Progress Notes (Unsigned)
 Chief Complaint  Patient presents with   Medicare Wellness     Subjective:   Kaitlyn Hudson is a 73 y.o. female who presents for a Medicare Annual Wellness Visit.  Allergies (verified) Codeine   History: Past Medical History:  Diagnosis Date   Allergic rhinitis    Allergy    Angina pectoris 05/15/2018   Anxiety 04/02/2016   See comments on depression above   CAD in native artery 06/26/2018   Cataract    Chest pressure 05/11/2018   Depression    I was prescribed medication when I stopped smoking, not for depression. I have recently stopped taking it.   Dyslipidemia 04/08/2018   Emphysema lung (HCC) 06/28/2024   Exertional shortness of breath 05/11/2018   Family history of systemic lupus erythematosus (SLE) in mother 04/08/2018   Fibromyalgia    Former smoker 04/08/2018   GERD (gastroesophageal reflux disease)    History of cervical dysplasia 04/02/2016   2013; hysterectomy d/t recurrent dysplasia s/p LEEP   History of depression 03/16/2018   History of hyperlipidemia 03/16/2018   Hypercholesteremia 04/02/2016   Osteoarthritis    Pain in right hip 03/28/2022   Primary osteoarthritis of both hands 04/08/2018   Sjogren's syndrome with keratoconjunctivitis sicca 04/08/2018   +ANA, +Ro, +La, sicca symptoms, fatigue   Sleep apnea    SOB (shortness of breath) on exertion 05/11/2018   Splenomegaly 06/28/2024   Trochanteric bursitis, right hip 01/09/2018   Unilateral primary osteoarthritis, left knee 05/23/2022   Past Surgical History:  Procedure Laterality Date   ABDOMINAL HYSTERECTOMY     HAS OVARIES   CATARACT EXTRACTION, BILATERAL  07/2022   EYE SURGERY     KNEE SURGERY Right    LYMPH NODE BIOPSY     groin    NASAL SEPTUM SURGERY  12/2020   Family History  Problem Relation Age of Onset   Lupus Mother    Osteoarthritis Mother    Polymyalgia rheumatica Mother    Osteoarthritis Father    Alzheimer's disease Father    Healthy Sister    Colon cancer Brother         having surgery   Healthy Brother    Breast cancer Maternal Grandmother    Esophageal cancer Neg Hx    Rectal cancer Neg Hx    Stomach cancer Neg Hx    Social History   Occupational History   Not on file  Tobacco Use   Smoking status: Former    Current packs/day: 0.00    Average packs/day: 1 pack/day for 30.0 years (30.0 ttl pk-yrs)    Types: Cigarettes    Start date: 09/02/1980    Quit date: 09/02/2010    Years since quitting: 13.8    Passive exposure: Never   Smokeless tobacco: Never  Vaping Use   Vaping status: Never Used  Substance and Sexual Activity   Alcohol use: Not Currently    Alcohol/week: 1.0 standard drink of alcohol    Comment: wine maybe every 6 months   Drug use: Never   Sexual activity: Not Currently   Tobacco Counseling Counseling given: Not Answered  SDOH Screenings   Food Insecurity: No Food Insecurity (06/21/2024)  Housing: Low Risk  (06/21/2024)  Transportation Needs: No Transportation Needs (06/21/2024)  Utilities: Not At Risk (05/17/2024)  Depression (PHQ2-9): Low Risk  (07/15/2024)  Financial Resource Strain: Low Risk  (06/21/2024)  Physical Activity: Inactive (06/21/2024)  Social Connections: Moderately Integrated (06/21/2024)  Stress: No Stress Concern Present (06/21/2024)  Tobacco Use:  Medium Risk (07/15/2024)   See flowsheets for full screening details  Depression Screen PHQ 2 & 9 Depression Scale- Over the past 2 weeks, how often have you been bothered by any of the following problems? Little interest or pleasure in doing things: 0 Feeling down, depressed, or hopeless (PHQ Adolescent also includes...irritable): 0 PHQ-2 Total Score: 0 Trouble falling or staying asleep, or sleeping too much: 0 Feeling tired or having little energy: 2 Poor appetite or overeating (PHQ Adolescent also includes...weight loss): 0 Feeling bad about yourself - or that you are a failure or have let yourself or your family down: 0 Trouble concentrating on  things, such as reading the newspaper or watching television (PHQ Adolescent also includes...like school work): 0 Moving or speaking so slowly that other people could have noticed. Or the opposite - being so fidgety or restless that you have been moving around a lot more than usual: 0 Thoughts that you would be better off dead, or of hurting yourself in some way: 0 PHQ-9 Total Score: 2 If you checked off any problems, how difficult have these problems made it for you to do your work, take care of things at home, or get along with other people?: Not difficult at all     Goals Addressed             This Visit's Progress    Eat Healthy          - change to whole grain breads, cereal, pasta - drink 6 to 8 glasses of water each day - manage portion size - reduce red meat to 2 to 3 times a week - switch to sugar-free drinks    Why is this important?   When you are ready to manage your nutrition or weight, having a plan and setting goals will help.  Taking small steps to change how you eat and exercise is a good place to start.    Notes:      Prevent Falls and Injury        Why is this important?   Most falls happen when it is hard for you to walk safely. Your balance may be off because of an illness. You may have pain in your knees, hip or other joints.  You may be overly tired or taking medicines that make you sleepy. You may not be able to see or hear clearly.  Falls can lead to broken bones, bruises or other injuries.  There are things you can do to help prevent falling.     Notes:        Visit info / Clinical Intake: Medicare Wellness Visit Type:: Subsequent Annual Wellness Visit Persons participating in visit:: patient Medicare Wellness Visit Mode:: Telephone If telephone:: video error Because this visit was a virtual/telehealth visit:: unable to obtan vitals due to lack of equipment If Telephone or Video please confirm:: I connected with the patient using audio enabled  telemedicine application and verified that I am speaking with the correct person using two identifiers; I discussed the limitations of evaluation and management by telemedicine; The patient expressed understanding and agreed to proceed Patient Location:: Home Provider Location:: Office Information given by:: patient Interpreter Needed?: No Pre-visit prep was completed: no AWV questionnaire completed by patient prior to visit?: no Living arrangements:: (!) lives alone Patient's Overall Health Status Rating: good Typical amount of pain: some Does pain affect daily life?: (!) yes (Sometimes) Are you currently prescribed opioids?: no  Dietary Habits and Nutritional Risks How  many meals a day?: 2 Eats fruit and vegetables daily?: yes Most meals are obtained by: preparing own meals In the last 2 weeks, have you had any of the following?: none Diabetic:: no  Functional Status Activities of Daily Living (to include ambulation/medication): Independent Ambulation: Independent Medication Administration: Independent Home Management: Independent Manage your own finances?: yes Primary transportation is: driving Concerns about vision?: no *vision screening is required for WTM* Concerns about hearing?: no  Fall Screening Falls in the past year?: 0 Number of falls in past year: 0 Was there an injury with Fall?: 0 Fall Risk Category Calculator: 0 Patient Fall Risk Level: Low Fall Risk  Fall Risk Patient at Risk for Falls Due to: No Fall Risks Fall risk Follow up: Falls evaluation completed; Education provided  Home and Transportation Safety: All rugs have non-skid backing?: N/A, no rugs All stairs or steps have railings?: N/A, no stairs Grab bars in the bathtub or shower?: yes Have non-skid surface in bathtub or shower?: yes Good home lighting?: yes Regular seat belt use?: yes Hospital stays in the last year:: no  Cognitive Assessment Difficulty concentrating, remembering, or making  decisions? : yes Will 6CIT or Mini Cog be Completed: yes What year is it?: 0 points What month is it?: 0 points Give patient an address phrase to remember (5 components): [address] [place] About what time is it?: 0 points Count backwards from 20 to 1: 0 points Say the months of the year in reverse: 0 points Repeat the address phrase from earlier: 0 points 6 CIT Score: 0 points  Advance Directives (For Healthcare) Does Patient Have a Medical Advance Directive?: No Would patient like information on creating a medical advance directive?: No - Patient declined  Reviewed/Updated  Reviewed/Updated: Reviewed All (Medical, Surgical, Family, Medications, Allergies, Care Teams, Patient Goals); Medical History; Patient Goals; Care Teams; Allergies; Medications; Family History; Surgical History        Objective:    There were no vitals filed for this visit. There is no height or weight on file to calculate BMI.  Current Medications (verified) Outpatient Encounter Medications as of 07/15/2024  Medication Sig   Acetaminophen (TYLENOL PO) Take by mouth as needed.   aspirin  EC 81 MG tablet Take 1 tablet (81 mg total) by mouth daily.   Calcium Carbonate-Vit D-Min (CALTRATE 600+D PLUS MINIS PO) 1,200 mg daily. 1200mg  Ca+ & unsure of D   cetirizine (ZYRTEC) 10 MG tablet Take 10 mg by mouth daily.   Esomeprazole Magnesium (NEXIUM PO) Take 40 mg by mouth daily.   fluticasone  (FLONASE ) 50 MCG/ACT nasal spray Place 1 spray into both nostrils daily.   hydroxychloroquine  (PLAQUENIL ) 200 MG tablet TAKE ONE TABLET BY MOUTH TWICE DAILY MONDAY THROUGH FRIDAY   Polyethyl Glycol-Propyl Glycol (SYSTANE OP) Apply to eye.   pravastatin (PRAVACHOL) 40 MG tablet Take 1 tablet (40 mg total) by mouth daily.   RESTASIS 0.05 % ophthalmic emulsion    No facility-administered encounter medications on file as of 07/15/2024.   Hearing/Vision screen No results found. Immunizations and Health  Maintenance Health Maintenance  Topic Date Due   Zoster Vaccines- Shingrix (1 of 2) Never done   COVID-19 Vaccine (5 - 2025-26 season) 05/03/2024   Lung Cancer Screening  05/28/2025   Medicare Annual Wellness (AWV)  07/15/2025   Mammogram  05/17/2026   Colonoscopy  08/18/2033   DTaP/Tdap/Td (2 - Td or Tdap) 05/17/2034   Pneumococcal Vaccine: 50+ Years  Completed   Influenza Vaccine  Completed  DEXA SCAN  Completed   Hepatitis C Screening  Completed   Meningococcal B Vaccine  Aged Out        Assessment/Plan:  This is a routine wellness examination for [facility].  Patient Care Team: Rothfuss, Jacob T, PA-C as PCP - General (Physician Assistant)  I have personally reviewed and noted the following in the patient's chart:   Medical and social history Use of alcohol, tobacco or illicit drugs  Current medications and supplements including opioid prescriptions. Functional ability and status Nutritional status Physical activity Advanced directives List of other physicians Hospitalizations, surgeries, and ER visits in previous 12 months Vitals Screenings to include cognitive, depression, and falls Referrals and appointments  No orders of the defined types were placed in this encounter.  In addition, I have reviewed and discussed with patient certain preventive protocols, quality metrics, and best practice recommendations. A written personalized care plan for preventive services as well as general preventive health recommendations were provided to patient.   Kaitlyn Hudson, CMA   07/15/2024   Return in 1 year (on 07/15/2025).  After Visit Summary: (MyChart) Due to this being a telephonic visit, the after visit summary with patients personalized plan was offered to patient via MyChart   Nurse Notes: Patient is due for shingrix and she know due to her insurance she will have to go to pharmacy. Denied covid vaccine.

## 2024-07-19 ENCOUNTER — Telehealth (HOSPITAL_BASED_OUTPATIENT_CLINIC_OR_DEPARTMENT_OTHER): Payer: Self-pay

## 2024-07-19 ENCOUNTER — Other Ambulatory Visit (HOSPITAL_BASED_OUTPATIENT_CLINIC_OR_DEPARTMENT_OTHER): Payer: Self-pay

## 2024-07-19 DIAGNOSIS — Z8659 Personal history of other mental and behavioral disorders: Secondary | ICD-10-CM

## 2024-07-19 MED ORDER — SERTRALINE HCL 100 MG PO TABS: ORAL_TABLET | ORAL | 1 refills | Status: DC

## 2024-07-19 NOTE — Telephone Encounter (Signed)
 Zoloft 100mg  was sent in.  Per PCP: We will need to titrate up on this, so she should take a half tablet for 3 weeks and then a full tablet thereafter.     Pt advised and voices understanding.

## 2024-07-27 ENCOUNTER — Ambulatory Visit: Attending: Cardiology

## 2024-07-27 ENCOUNTER — Encounter: Payer: Self-pay | Admitting: Cardiology

## 2024-07-27 DIAGNOSIS — J449 Chronic obstructive pulmonary disease, unspecified: Secondary | ICD-10-CM | POA: Diagnosis not present

## 2024-07-27 DIAGNOSIS — E782 Mixed hyperlipidemia: Secondary | ICD-10-CM | POA: Diagnosis not present

## 2024-07-27 DIAGNOSIS — M3501 Sicca syndrome with keratoconjunctivitis: Secondary | ICD-10-CM | POA: Diagnosis not present

## 2024-07-27 DIAGNOSIS — I2510 Atherosclerotic heart disease of native coronary artery without angina pectoris: Secondary | ICD-10-CM

## 2024-07-27 LAB — ECHOCARDIOGRAM COMPLETE
Area-P 1/2: 4.53 cm2
MV M vel: 6.28 m/s
MV Peak grad: 157.8 mmHg
Radius: 0.4 cm
S' Lateral: 2.6 cm

## 2024-08-30 ENCOUNTER — Encounter: Payer: Self-pay | Admitting: Orthopaedic Surgery

## 2024-08-31 ENCOUNTER — Other Ambulatory Visit: Payer: Self-pay | Admitting: Physician Assistant

## 2024-08-31 NOTE — Telephone Encounter (Signed)
 Last Fill: 05/17/2024  Eye exam: 05/06/2024 WNL   Labs: 04/26/2024 CBC WNL  Glucose is 120, rest of CMP WNL  RF negative  Urine protein creatinine ratio WNL  Next Visit: 09/29/2024  Last Visit: 04/26/2024  IK:Dgnhmzw'd syndrome with keratoconjunctivitis sicca   Current Dose per office note 04/26/2024: Plaquenil  200 mg 1 tablet by mouth twice daily monday through friday.   Okay to refill Plaquenil ?

## 2024-09-09 ENCOUNTER — Ambulatory Visit (HOSPITAL_BASED_OUTPATIENT_CLINIC_OR_DEPARTMENT_OTHER): Attending: Pulmonary Disease | Admitting: Pulmonary Disease

## 2024-09-09 DIAGNOSIS — G4733 Obstructive sleep apnea (adult) (pediatric): Secondary | ICD-10-CM | POA: Insufficient documentation

## 2024-09-12 ENCOUNTER — Telehealth: Payer: Self-pay | Admitting: Pulmonary Disease

## 2024-09-12 DIAGNOSIS — G4733 Obstructive sleep apnea (adult) (pediatric): Secondary | ICD-10-CM | POA: Diagnosis not present

## 2024-09-12 NOTE — Telephone Encounter (Signed)
 Call patient  Sleep study result  Date of study: 09/09/2024  Impression: Mild obstructive sleep apnea with AHI of 11.3, moderate oxygen desaturations Poor sleep efficiency, difficulty initiating and maintaining sleep  Recommendation: Options for treating mild obstructive sleep apnea may include CPAP therapy if there are significant daytime symptoms or notable comorbidities. Auto CPAP 5-15 with heated humidification and the patient's preferred mask may be considered; other treatment options may include an oral device, watchful waiting with significant weight loss efforts. Avoid alcohol, sedatives and other CNS depressants that may worsen sleep apnea and disrupt normal sleep architecture. Sleep hygiene should be reviewed to assess factors that may improve sleep quality. Weight management and regular exercise should be initiated or continued  Further evaluation of insomnia

## 2024-09-12 NOTE — Procedures (Signed)
" °  Indications for Polysomnography The patient is a 74 year-old Female who is 5' 2 and weighs 190.0 lbs. Her BMI equals 35.0.  A full night polysomnogram was performed to evaluate for -.  MedicationNo Data. Polysomnogram Data A full night polysomnogram recorded the standard physiologic parameters including EEG, EOG, EMG, EKG, nasal and oral airflow.  Respiratory parameters of chest and abdominal movements were recorded with Respiratory Inductance Plethysmography belts.   Oxygen saturation was recorded by pulse oximetry.  Sleep Architecture The total recording time of the polysomnogram was 406.6 minutes.  The total sleep time was 202.0 minutes.  The patient spent 3.2% of total sleep time in Stage N1, 89.4% in Stage N2, 7.4% in Stages N3, and 0.0% in REM.  Sleep latency was 35.9 minutes.   REM latency was - minutes.  Sleep Efficiency was 49.7%.  Wake after Sleep Onset time was 169.0 minutes.  Respiratory Events The polysomnogram revealed a presence of 1 obstructive, 1 central, and - mixed apneas resulting in an Apnea index of 0.6 events per hour.  There were 36 hypopneas (GreaterEqual to3% desaturation and/or arousal) resulting in an Apnea\Hypopnea Index (AHI  GreaterEqual to3% desaturation and/or arousal) of 11.3 events per hour.  There were 15 hypopneas (GreaterEqual to4% desaturation) resulting in an Apnea\Hypopnea Index (AHI GreaterEqual to4% desaturation) of 5.0 events per hour.  There were - Respiratory  Effort Related Arousals resulting in a RERA index of - events per hour. The Respiratory Disturbance Index is 11.3 events per hour.  The snore index was 0.3 events per hour.  Mean oxygen saturation was 91.3%.  The lowest oxygen saturation during sleep was 84.0%.  Time spent LessEqual to88% oxygen saturation was  minutes ().  Limb Activity There were 42 total limb movements recorded, of this total, 8 were classified as PLMs.  PLM index was 2.4 per hour and PLM associated with Arousals index was  - per hour.  Cardiac Summary The average pulse rate was 70.2 bpm.  The minimum pulse rate was 60.0 bpm while the maximum pulse rate was 103.0 bpm.  Cardiac rhythm was normal/abnormal.  Comments: Patient was scheduled for split-night study, split-night criteria not met, had a diagnostic study performed  Diagnosis: Mild obstructive sleep apnea with AHI of 11.3, O2 nadir of 84% with saturations below 88% for 48 minutes Difficulty initiating sleep, difficulty maintaining sleep with increased sleep fragmentation Sleep efficiency less than 50% No REM sleep was achieved No significant periodic limb movement Cardiac rhythm was sinus  Recommendations: Options for treating mild obstructive sleep apnea may include CPAP therapy if there are significant daytime symptoms or notable comorbidities. Auto CPAP 5-15 with heated humidification and the patient's preferred mask may be considered; other treatment  options may include an oral device, watchful waiting with significant weight loss efforts. Avoid alcohol, sedatives and other CNS depressants that may worsen sleep apnea and disrupt normal sleep architecture. Sleep hygiene should be reviewed to assess factors that may improve sleep quality. Weight management and regular exercise should be initiated or continued  Further management of contributors to insomnia   This study was personally reviewed and electronically signed by: Kaesha Kirsch MD Accredited Board Certified in Sleep Medicine Date/Time:  09/12/24 "

## 2024-09-12 NOTE — Procedures (Signed)
 [facility] [address] [place], [place] [address] Tel: [phone]   Fax: [phone]  Polysomnography Interpretation  Patient Name:  Kaitlyn Hudson, Kaitlyn Hudson Study Date:  09/09/2024 Referring Physician:  JENNET EPLEY [phone])   Indications for Polysomnography The patient is a 74 year-old Female who is 5' 2 and weighs 190.0 lbs. Her BMI equals 35.0.  A full night polysomnogram was performed to evaluate for -.  Medication  No Data.   Polysomnogram Data A full night polysomnogram recorded the standard physiologic parameters including EEG, EOG, EMG, EKG, nasal and oral airflow.  Respiratory parameters of chest and abdominal movements were recorded with Respiratory Inductance Plethysmography belts.  Oxygen saturation was recorded by pulse oximetry.   Sleep Architecture The total recording time of the polysomnogram was 406.6 minutes.  The total sleep time was 202.0 minutes.  The patient spent 3.2% of total sleep time in Stage N1, 89.4% in Stage N2, 7.4% in Stages N3, and 0.0% in REM.  Sleep latency was 35.9 minutes.  REM latency was - minutes.  Sleep Efficiency was 49.7%.  Wake after Sleep Onset time was 169.0 minutes.  Respiratory Events The polysomnogram revealed a presence of 1 obstructive, 1 central, and - mixed apneas resulting in an Apnea index of 0.6 events per hour.  There were 36 hypopneas (>=3% desaturation and/or arousal) resulting in an Apnea\Hypopnea Index (AHI >=3% desaturation and/or arousal) of 11.3 events per hour.  There were 15 hypopneas (>=4% desaturation) resulting in an Apnea\Hypopnea Index (AHI >=4% desaturation) of 5.0 events per hour.  There were - Respiratory Effort Related Arousals resulting in a RERA index of - events per hour. The Respiratory Disturbance Index is 11.3 events per hour.  The snore index was 0.3 events per hour.  Mean oxygen saturation was 91.3%.  The lowest oxygen saturation during sleep was 84.0%.  Time spent <=88% oxygen  saturation was 48.0 minutes (12.0%).  Limb Activity There were 42 total limb movements recorded, of this total, 8 were classified as PLMs.  PLM index was 2.4 per hour and PLM associated with Arousals index was - per hour.  Cardiac Summary The average pulse rate was 70.2 bpm.  The minimum pulse rate was 60.0 bpm while the maximum pulse rate was 103.0 bpm.  Cardiac rhythm was normal/abnormal.  Comments:  Patient was scheduled for split-night study, split-night criteria not met, had a diagnostic study performed  Diagnosis:  Mild obstructive sleep apnea with AHI of 11.3, O2 nadir of 84% with saturations below 88% for 48 minutes Difficulty initiating sleep, difficulty maintaining sleep with increased sleep fragmentation Sleep efficiency less than 50% No REM sleep was achieved No significant periodic limb movement Cardiac rhythm was sinus  Recommendations: Options for treating mild obstructive sleep apnea may include CPAP therapy if there are significant daytime symptoms or notable comorbidities. Auto CPAP 5-15 with heated humidification and the patient's preferred mask may be considered; other treatment options may include an oral device, watchful waiting with significant weight loss efforts. Avoid alcohol, sedatives and other CNS depressants that may worsen sleep apnea and disrupt normal sleep architecture. Sleep hygiene should be reviewed to assess factors that may improve sleep quality. Weight management and regular exercise should be initiated or continued  Further management of contributors to insomnia   This study was personally reviewed and electronically signed by: Zhanna Melin MD Accredited Board Certified in Sleep Medicine Date/Time:   09/12/24     Diagnostic PSG Report  Patient Name: Kaitlyn, Hudson Study Date:  09/09/2024  Date of Birth: 04-26-1951 Study Type: Diagnostic  Age: 69 year MRN #: [id number]  Sex: Female Interpreting Physician: NEDA HAMMOND, [id number]  Height:  5' 2 Referring Physician: HAMMOND NEDA [phone])  Weight: 190.0 lbs Recording Tech: Prentice Coombe RPSGT RST  BMI: 35.0 Scoring Tech: Prentice Coombe RPSGT RST  ESS: 5 Neck Size: 15   Study Overview  Lights Off: 09:56:27 PM  Count Index  Lights On: 04:43:03 AM Awakenings: 7 2.1  Time in Bed: 406.6 min. Arousals: 1 0.3  Total Sleep Time: 202.0 min. AHI (>=3% Desat and/or Ar.): 38 11.3   Sleep Efficiency: 49.7% AHI (>=4% Desat): 17 5.0   Sleep Latency: 35.9 min. Limb Movements: 42 12.5  Wake After Sleep Onset: 169.0 min. Snore: 1 0.3  REM Latency from Sleep Onset: - min. Desaturations: 41 12.2     Minimum SpO2 TST: 84.0%    Sleep Architecture  % of Time in Bed Stages Time (mins) % Sleep Time  Wake 205.0   Stage N1 6.5 3.2%  Stage N2 180.5 89.4%  Stage N3 15.0 7.4%  REM 0.0 0.0%   Arousal Summary   NREM REM Sleep Index  Respiratory Arousals - - - -  PLM Arousals - - - -  Isolated Limb Movement Arousals - - - -  Snore Arousals - - - -  Spontaneous Arousals 1 - 1 0.3  Total 1 - 1 0.3   Limb Movement Summary   Count Index  Isolated Limb Movements 34 10.1  Periodic Limb Movements (PLMs) 8 2.4  Total Limb Movements 42 12.5    Respiratory Summary   By Sleep Stage By Body Position Total   NREM REM Supine Non-Supine   Time (min) 202.0 0.0 - 202.0 202.0         Obstructive Apnea 1 - - 1 1  Mixed Apnea - - - - -  Central Apnea 1 - - 1 1  Total Apneas 2 - - 2 2  Total Apnea Index 0.6 - - 0.6 0.6         Hypopneas (>=3% Desat and/or Ar.) 36 - - 36 36  AHI (>=3% Desat and/or Ar.) 11.3 - - 11.3 11.3         Hypopneas (>=4% Desat) 15 - - 15 15  AHI (>=4% Desat) 5.0 - - 5.0 5.0          RERAs - - - - -  RERA Index - - - - -         RDI 11.3 - - 11.3 11.3    Respiratory Event Type Index  Central Apneas 0.3  Obstructive Apneas 0.3  Mixed Apneas -  Central Hypopneas -  Obstructive Hypopneas 5.0  Central Apnea + Hypopnea (CAHI) 0.3  Obstructive Apnea + Hypopnea (OAHI)  11.0   Respiratory Event Durations   Apnea Hypopnea   NREM REM NREM REM  Average (seconds) 17.5 - 28.4 -  Maximum (seconds) 17.9 - 49.2 -    Oxygen Saturation Summary   Wake NREM REM TST TIB  Average SpO2 (%) 92.5% 90.1% - 90.1% 91.3%  Minimum SpO2 (%) 88.0% 84.0% - 84.0% 84.0%  Maximum SpO2 (%) 98.0% 96.0% - 96.0% 98.0%   Oxygen Saturation Distribution  Range (%) Time in range (min) Time in range (%)  90.0 - 100.0 271.5 68.2%  80.0 - 90.0 126.8 31.8%  70.0 - 80.0 - -  60.0 - 70.0 - -  50.0 - 60.0 - -  0.0 - 50.0 - -  Time Spent <=88% SpO2  Range (%) Time in range (min) Time in range (%)  0.0 - 88.0 48.0 12.0%      Count Index  Desaturations 41 12.2    Cardiac Summary   Wake NREM REM Sleep Total  Average Pulse Rate (BPM) 70.3 70.2 - 70.2 70.2  Minimum Pulse Rate (BPM) 60.0 60.0 - 60.0 60.0  Maximum Pulse Rate (BPM) 103.0 78.0 - 78.0 103.0   Pulse Rate Distribution:  Range (bpm) Time in range (min) Time in range (%)  0.0 - 40.0 - -  40.0 - 60.0 0.2 0.0%  60.0 - 80.0 385.0 96.6%  80.0 - 100.0 12.8 3.2%  100.0 - 120.0 0.4 0.1%  120.0 - 140.0 - -  140.0 - 200.0 - -      Hypnograms                      Technologist Comments  STUDY TYPE= PSG DATE= 09/09/2024  ASSOCIATED DIAGNOSES WAS OSA. SPLIT NIGHT STUDY WAS ORDERED. TECH EXPALINED THE PROCESS OF HOOK UP AND WIRES. PATIENT HAD DIFFICULTY FALLING ASLEEP. TECH OBSERVED MILD SLEEP DISORDER BREATHING. THE PATIENT DID NOT MEET SPLIT. STUDY WAS CONTINUED AS DIAGNOSTIC FOR MORE INFORMATION. THE PATIENT WAS RESTLESS . PATIENT SLEPT LEFT, RIGHT AND SUPINE.  BATHROOM BREAK WAS TAKING ONE TIME. PATIENT DID NOT TAKE MEDICATION AT THE LAB. PATIENT SNORES VERY MODERATE. SIMPLUS FULL FACE MASK WAS FITTED FOR THIS STUDY. PATIENT TOSSED AND TURNED ALL THROUGH THE NIGHT. SLEEP SYMPTOMS ARE WAKE UP FATIGUED. SLEEP APNEA. SNORES LOUD AND CONTINOUS. BODY OR LEG JERK. RESTLESS SLEEP. WAKE UP TO GO THE  RESTROOM.

## 2024-09-13 ENCOUNTER — Ambulatory Visit

## 2024-09-13 ENCOUNTER — Ambulatory Visit: Admitting: Pulmonary Disease

## 2024-09-13 ENCOUNTER — Encounter: Payer: Self-pay | Admitting: Pulmonary Disease

## 2024-09-13 VITALS — BP 122/60 | HR 89 | Temp 97.3°F | Ht 62.0 in | Wt 185.8 lb

## 2024-09-13 DIAGNOSIS — J439 Emphysema, unspecified: Secondary | ICD-10-CM | POA: Diagnosis not present

## 2024-09-13 DIAGNOSIS — R5383 Other fatigue: Secondary | ICD-10-CM

## 2024-09-13 DIAGNOSIS — G4733 Obstructive sleep apnea (adult) (pediatric): Secondary | ICD-10-CM

## 2024-09-13 DIAGNOSIS — Z87891 Personal history of nicotine dependence: Secondary | ICD-10-CM | POA: Diagnosis not present

## 2024-09-13 DIAGNOSIS — R0602 Shortness of breath: Secondary | ICD-10-CM

## 2024-09-13 LAB — PULMONARY FUNCTION TEST
DL/VA % pred: 73 %
DL/VA: 3.06 ml/min/mmHg/L
DLCO unc % pred: 71 %
DLCO unc: 13.17 ml/min/mmHg
FEF 25-75 Post: 1.49 L/s
FEF 25-75 Pre: 1.41 L/s
FEF2575-%Change-Post: 5 %
FEF2575-%Pred-Post: 86 %
FEF2575-%Pred-Pre: 82 %
FEV1-%Change-Post: 1 %
FEV1-%Pred-Post: 98 %
FEV1-%Pred-Pre: 96 %
FEV1-Post: 2.02 L
FEV1-Pre: 1.99 L
FEV1FVC-%Change-Post: 4 %
FEV1FVC-%Pred-Pre: 97 %
FEV6-%Change-Post: -2 %
FEV6-%Pred-Post: 100 %
FEV6-%Pred-Pre: 103 %
FEV6-Post: 2.62 L
FEV6-Pre: 2.7 L
FEV6FVC-%Change-Post: 0 %
FEV6FVC-%Pred-Post: 104 %
FEV6FVC-%Pred-Pre: 104 %
FVC-%Change-Post: -3 %
FVC-%Pred-Post: 96 %
FVC-%Pred-Pre: 99 %
FVC-Post: 2.64 L
FVC-Pre: 2.72 L
Post FEV1/FVC ratio: 77 %
Post FEV6/FVC ratio: 99 %
Pre FEV1/FVC ratio: 73 %
Pre FEV6/FVC Ratio: 99 %
RV % pred: 109 %
RV: 2.35 L
TLC % pred: 104 %
TLC: 5.06 L

## 2024-09-13 MED ORDER — ALBUTEROL SULFATE HFA 108 (90 BASE) MCG/ACT IN AERS: 2.0000 | INHALATION_SPRAY | Freq: Four times a day (QID) | RESPIRATORY_TRACT | 6 refills | Status: AC | PRN

## 2024-09-13 NOTE — Patient Instructions (Signed)
 Full pft performed today

## 2024-09-13 NOTE — Patient Instructions (Addendum)
 Prescription for albuterol  to be used as needed 2 puffs up to 4 times a day as needed for shortness of breath  Continue graded activities as tolerated  Your breathing study did not show significant obstruction, slight inclination to trapping air-albuterol  should help with this  Your sleep study did show mild sleep apnea, poor study because you did not achieve a lot of sleep during that study Continue weight loss efforts, weight loss does help the severity of sleep disordered breathing  Good luck with your surgery  Call us  with significant concerns Follow-up in 4 to 5 months

## 2024-09-13 NOTE — Progress Notes (Signed)
 "              Kaitlyn Hudson    [id number]    1950-12-20  Primary Care Physician:Rothfuss, Lang DASEN, PA-C  Referring Physician: Neda Jennet DELENA, MD [address] [place],  [place] [address]  Chief complaint:   In for follow-up for obstructive sleep apnea Had a PFT done today  HPI:  She was seen in October following being seen previously in 2020  Ongoing concern for sleep apnea  She did have a sleep study performed stated she had a bad night during the study, did not feel like she slept much at all Sleep efficiency was less than 50% and had mild sleep apnea diagnosed She felt the study was not accurate as she felt she did not sleep at all  She has daily fatigue Dryness of her mouth in the mornings, no morning headaches Recent weight gain, chronic fatigue - She attributes this to having fibromyalgia and Sjogren's Shortness of breath with exertion  Continues to follow with cardiology as well  Smoked a pack a day previously quit in 2012  Has a pet dog Does not feel she has allergies from dogs  She does have an early morning cough  No pertinent occupational history  Outpatient Encounter Medications as of 09/13/2024  Medication Sig   Acetaminophen (TYLENOL PO) Take by mouth as needed.   aspirin  EC 81 MG tablet Take 1 tablet (81 mg total) by mouth daily.   Calcium Carbonate-Vit D-Min (CALTRATE 600+D PLUS MINIS PO) 1,200 mg daily. 1200mg  Ca+ & unsure of D   cetirizine (ZYRTEC) 10 MG tablet Take 10 mg by mouth daily.   Esomeprazole Magnesium (NEXIUM PO) Take 40 mg by mouth daily.   fluticasone  (FLONASE ) 50 MCG/ACT nasal spray Place 1 spray into both nostrils daily.   hydroxychloroquine  (PLAQUENIL ) 200 MG tablet TAKE ONE TABLET BY MOUTH TWICE DAILY MONDAY THROUGH FRIDAY   Polyethyl Glycol-Propyl Glycol (SYSTANE OP) Apply to eye.   pravastatin  (PRAVACHOL ) 40 MG tablet Take 1 tablet (40 mg total) by mouth daily.   RESTASIS 0.05 % ophthalmic emulsion    sertraline  (ZOLOFT )  100 MG tablet Take 0.5 tablets (50 mg total) by mouth daily for 21 days, THEN 1 tablet (100 mg total) daily for 21 days. You will continue the 100 mg thereafter.   No facility-administered encounter medications on file as of 09/13/2024.    Allergies as of 09/13/2024 - Review Complete 09/13/2024  Allergen Reaction Noted   Codeine Nausea And Vomiting 09/29/2017    Past Medical History:  Diagnosis Date   Allergic rhinitis    Allergy    Angina pectoris 05/15/2018   Anxiety 04/02/2016   See comments on depression above   CAD in native artery 06/26/2018   Cataract    Chest pressure 05/11/2018   Depression    I was prescribed medication when I stopped smoking, not for depression. I have recently stopped taking it.   Dyslipidemia 04/08/2018   Emphysema lung (HCC) 06/28/2024   Exertional shortness of breath 05/11/2018   Family history of systemic lupus erythematosus (SLE) in mother 04/08/2018   Fibromyalgia    Former smoker 04/08/2018   GERD (gastroesophageal reflux disease)    History of cervical dysplasia 04/02/2016   2013; hysterectomy d/t recurrent dysplasia s/p LEEP   History of depression 03/16/2018   History of hyperlipidemia 03/16/2018   Hypercholesteremia 04/02/2016   Osteoarthritis    Pain in right hip 03/28/2022   Primary osteoarthritis of  both hands 04/08/2018   Sjogren's syndrome with keratoconjunctivitis sicca 04/08/2018   +ANA, +Ro, +La, sicca symptoms, fatigue   Sleep apnea    SOB (shortness of breath) on exertion 05/11/2018   Splenomegaly 06/28/2024   Trochanteric bursitis, right hip 01/09/2018   Unilateral primary osteoarthritis, left knee 05/23/2022    Past Surgical History:  Procedure Laterality Date   ABDOMINAL HYSTERECTOMY     HAS OVARIES   CATARACT EXTRACTION, BILATERAL  07/2022   EYE SURGERY     KNEE SURGERY Right    LYMPH NODE BIOPSY     groin    NASAL SEPTUM SURGERY  12/2020    Family History  Problem Relation Age of Onset   Lupus Mother     Osteoarthritis Mother    Polymyalgia rheumatica Mother    Osteoarthritis Father    Alzheimer's disease Father    Healthy Sister    Colon cancer Brother        having surgery   Healthy Brother    Breast cancer Maternal Grandmother    Esophageal cancer Neg Hx    Rectal cancer Neg Hx    Stomach cancer Neg Hx     Social History   Socioeconomic History   Marital status: Widowed    Spouse name: Not on file   Number of children: 0   Years of education: Not on file   Highest education level: 12th grade  Occupational History   Not on file  Tobacco Use   Smoking status: Former    Current packs/day: 0.00    Average packs/day: 1 pack/day for 30.0 years (30.0 ttl pk-yrs)    Types: Cigarettes    Start date: 09/02/1980    Quit date: 09/02/2010    Years since quitting: 14.0    Passive exposure: Never   Smokeless tobacco: Never  Vaping Use   Vaping status: Never Used  Substance and Sexual Activity   Alcohol use: Not Currently    Alcohol/week: 1.0 standard drink of alcohol    Comment: wine maybe every 6 months   Drug use: Never   Sexual activity: Not Currently  Other Topics Concern   Not on file  Social History Narrative   Not on file   Social Drivers of Health   Tobacco Use: Medium Risk (09/13/2024)   Patient History    Smoking Tobacco Use: Former    Smokeless Tobacco Use: Never    Passive Exposure: Never  Physicist, Medical Strain: Low Risk (06/21/2024)   Overall Financial Resource Strain (CARDIA)    Difficulty of Paying Living Expenses: Not hard at all  Food Insecurity: No Food Insecurity (07/15/2024)   Epic    Worried About Programme Researcher, Broadcasting/film/video in the Last Year: Never true    Ran Out of Food in the Last Year: Never true  Transportation Needs: No Transportation Needs (07/15/2024)   Epic    Lack of Transportation (Medical): No    Lack of Transportation (Non-Medical): No  Physical Activity: Inactive (07/15/2024)   Exercise Vital Sign    Days of Exercise per Week: 0 days     Minutes of Exercise per Session: 0 min  Stress: No Stress Concern Present (07/15/2024)   [employer] of Occupational Health - Occupational Stress Questionnaire    Feeling of Stress: Not at all  Social Connections: Moderately Integrated (07/15/2024)   Social Connection and Isolation Panel    Frequency of Communication with Friends and Family: More than three times a week    Frequency of Social  Gatherings with Friends and Family: More than three times a week    Attends Religious Services: More than 4 times per year    Active Member of Clubs or Organizations: Yes    Attends Banker Meetings: More than 4 times per year    Marital Status: Widowed  Intimate Partner Violence: Not At Risk (07/15/2024)   Epic    Fear of Current or Ex-Partner: No    Emotionally Abused: No    Physically Abused: No    Sexually Abused: No  Depression (PHQ2-9): Low Risk (07/15/2024)   Depression (PHQ2-9)    PHQ-2 Score: 2  Alcohol Screen: Not on file  Housing: Low Risk (07/15/2024)   Epic    Unable to Pay for Housing in the Last Year: No    Number of Times Moved in the Last Year: 0    Homeless in the Last Year: No  Utilities: Not At Risk (07/15/2024)   Epic    Threatened with loss of utilities: No  Health Literacy: Not on file    Review of Systems  Constitutional:  Positive for fatigue.  Respiratory:  Positive for apnea and shortness of breath.   Psychiatric/Behavioral:  Positive for sleep disturbance.     Vitals:   09/13/24 1506  BP: 122/60  Pulse: 89  Temp: (!) 97.3 F (36.3 C)  SpO2: 96%     Physical Exam Constitutional:      Appearance: She is obese.  HENT:     Head: Normocephalic.     Nose: Nose normal.     Mouth/Throat:     Mouth: Mucous membranes are moist.     Comments: Mallampati 3, crowded oropharynx Eyes:     General: No scleral icterus.    Pupils: Pupils are equal, round, and reactive to light.  Cardiovascular:     Rate and Rhythm: Normal rate and  regular rhythm.     Pulses: Normal pulses.     Heart sounds: No murmur heard.    No friction rub.  Pulmonary:     Effort: No respiratory distress.     Breath sounds: No stridor. No rhonchi.  Musculoskeletal:     Cervical back: No rigidity or tenderness.  Skin:    General: Skin is warm.  Neurological:     General: No focal deficit present.     Mental Status: She is alert.  Psychiatric:        Mood and Affect: Mood normal.   Epworth of 3  Data Reviewed: Sleep study 05/07/2018 with an AHI of 18.8, auto CPAP was  Sleep study 09/09/2024 reviewed showing AHI of 11.3, sleep efficiency less than 50%, had difficulty initiating and maintaining sleep.  Felt study was not reflective of usual sleep as she could not get any good sleep  Pulmonary function test 10/01/2018 was normal PFT 09/13/2024 shows no obstruction, no significant bronchodilator response, mild air trapping with RV/TLC of 46, mild reduction in diffusing capacity at 71%  Last echocardiogram was in 2019-this was normal Echocardiogram 07/27/2024 - Normal ejection fraction at 60 to 65%, normal right ventricular systolic function mild elevation in pulmonary pressures with right ventricular systolic pressure 40.2  Low-dose CT screening 05/28/2024 reported as lung RADS 2, emphysema, ground glass nodule left lower lobe-film was reviewed with the patient  Assessment/Plan:  History of obstructive sleep apnea Recent study shows mild sleep apnea however patient feels the study is not reflective of what I usually sleep is that she could not get to sleep well -  She feels that tiredness and fatigue is likely related to her fibromyalgia and Sjogren's rather than sleep apnea - We did discuss ensuring that she continues to stay active, weight loss efforts as tolerated  With air trapping on pulmonary function test - Will use albuterol  as needed  Emphysema - Continue to monitor  Low-dose CT screening - To repeat a year from  now  Multifactorial fatigue  Encouraged to follow-up with cardiology  Follow-up in about 4 to 5 months from here  Having hip surgery done in the next few months Has no significant preclusion to undergoing surgery  Encouraged to call with any significant concerns  I personally spent a total of 33 minutes in the care of the patient today including preparing to see the patient, getting/reviewing separately obtained history, performing a medically appropriate exam/evaluation, counseling and educating, referring and communicating with other health care professionals, documenting clinical information in the EHR, and independently interpreting results.   Jennet Epley MD [facility] 09/13/2024, 3:24 PM  CC: Epley Jennet LABOR, MD   "

## 2024-09-13 NOTE — Progress Notes (Signed)
 Full pft performed today

## 2024-09-15 NOTE — Progress Notes (Signed)
 "  Office Visit Note  Patient: Kaitlyn Hudson             Date of Birth: 12-Feb-1951           MRN: [id number]             PCP: Iven Lang ONEIDA DEVONNA Referring: Sabas Norleen PARAS., MD Visit Date: 09/29/2024 Occupation: Data Unavailable  Subjective:  Right hip pain   History of Present Illness: Kaitlyn Hudson is a 74 y.o. female with Sjogren's and osteoarthritis.  She returns today after her last visit in August 2025.  She has been using Restasis and over-the-counter products.  She continues to have dry mouth and dry eye symptoms.  She sees her ophthalmologist and dentist on a regular basis.  She denies any increased shortness of breath.    She continues to have some stiffness in her hands.  She complains of discomfort in her right CMC joint.  She continues to have pain in her right hip and is scheduled for right total hip replacement on October 12, 2024.  She continues to have some discomfort in her left hip and right knee joint.  She states her right knee joint will be replaced in the future.  She has generalized pain and discomfort from abnormality.    Activities of Daily Living:  Patient reports morning stiffness for 30 minutes.   Patient Reports nocturnal pain.  Difficulty dressing/grooming: Denies Difficulty climbing stairs: Denies Difficulty getting out of chair: Denies Difficulty using hands for taps, buttons, cutlery, and/or writing: Reports  Review of Systems  Constitutional:  Positive for fatigue.  HENT:  Positive for mouth dryness. Negative for mouth sores.   Eyes:  Positive for dryness.  Respiratory:  Negative for shortness of breath.   Cardiovascular:  Negative for chest pain and palpitations.  Gastrointestinal:  Negative for blood in stool, constipation and diarrhea.  Endocrine: Negative for increased urination.  Genitourinary:  Negative for involuntary urination.  Musculoskeletal:  Positive for joint pain, joint pain, muscle weakness, morning stiffness and muscle  tenderness. Negative for gait problem, joint swelling, myalgias and myalgias.  Skin:  Positive for hair loss and sensitivity to sunlight. Negative for color change and rash.  Allergic/Immunologic: Negative for susceptible to infections.  Neurological:  Negative for dizziness and headaches.  Hematological:  Negative for swollen glands.  Psychiatric/Behavioral:  Negative for depressed mood and sleep disturbance. The patient is not nervous/anxious.     PMFS History:  Patient Active Problem List   Diagnosis Date Noted   Osteoarthritis    GERD (gastroesophageal reflux disease)    Depression    Cataract    Allergy    Allergic rhinitis    Splenomegaly 06/28/2024   Emphysema lung (HCC) 06/28/2024   Unilateral primary osteoarthritis, left knee 05/23/2022   Pain in right hip 03/28/2022   Trochanteric bursitis, right hip 03/28/2022   CAD in native artery 06/26/2018   Sleep apnea 05/15/2018   Angina pectoris 05/15/2018   SOB (shortness of breath) on exertion 05/11/2018   Chest pressure 05/11/2018   Fibromyalgia 05/11/2018   Exertional shortness of breath 05/11/2018   Sjogren's syndrome with keratoconjunctivitis sicca 04/08/2018   Former smoker 04/08/2018   Family history of systemic lupus erythematosus (SLE) in mother 04/08/2018   Dyslipidemia 04/08/2018   Primary osteoarthritis of both hands 04/08/2018   History of depression 03/16/2018   History of hyperlipidemia 03/16/2018   Anxiety 04/02/2016   History of cervical dysplasia 04/02/2016   Hypercholesteremia 04/02/2016  Past Medical History:  Diagnosis Date   Allergic rhinitis    Allergy    Angina pectoris 05/15/2018   Anxiety 04/02/2016   See comments on depression above   CAD in native artery 06/26/2018   Cataract    Chest pressure 05/11/2018   Depression    I was prescribed medication when I stopped smoking, not for depression. I have recently stopped taking it.   Dyslipidemia 04/08/2018   Emphysema lung (HCC)  06/28/2024   Exertional shortness of breath 05/11/2018   Family history of systemic lupus erythematosus (SLE) in mother 04/08/2018   Fibromyalgia    Former smoker 04/08/2018   GERD (gastroesophageal reflux disease)    History of cervical dysplasia 04/02/2016   2013; hysterectomy d/t recurrent dysplasia s/p LEEP   History of depression 03/16/2018   History of hyperlipidemia 03/16/2018   Hypercholesteremia 04/02/2016   Osteoarthritis    Pain in right hip 03/28/2022   Primary osteoarthritis of both hands 04/08/2018   Sjogren's syndrome with keratoconjunctivitis sicca 04/08/2018   +ANA, +Ro, +La, sicca symptoms, fatigue   Sleep apnea    SOB (shortness of breath) on exertion 05/11/2018   Splenomegaly 06/28/2024   Trochanteric bursitis, right hip 01/09/2018   Unilateral primary osteoarthritis, left knee 05/23/2022    Family History  Problem Relation Age of Onset   Lupus Mother    Osteoarthritis Mother    Polymyalgia rheumatica Mother    Osteoarthritis Father    Alzheimer's disease Father    Healthy Sister    Colon cancer Brother        having surgery   Cancer Brother    Breast cancer Maternal Grandmother    Esophageal cancer Neg Hx    Rectal cancer Neg Hx    Stomach cancer Neg Hx    Past Surgical History:  Procedure Laterality Date   ABDOMINAL HYSTERECTOMY     HAS OVARIES   CATARACT EXTRACTION, BILATERAL  07/2022   EYE SURGERY     KNEE SURGERY Right    LYMPH NODE BIOPSY     groin    NASAL SEPTUM SURGERY  12/2020   Social History[1] Social History   Social History Narrative   Not on file     Immunization History  Administered Date(s) Administered   INFLUENZA, HIGH DOSE SEASONAL PF 06/24/2018, 06/22/2019, 06/28/2024   Influenza-Unspecified 09/04/2022   Moderna Covid-19 Fall Seasonal Vaccine 38yrs & older 04/04/2021   Moderna Sars-Covid-2 Vaccination 09/23/2019, 10/20/2019, 06/30/2020   PNEUMOCOCCAL CONJUGATE-20 06/28/2024   Tdap 05/17/2024      Objective: Vital Signs: BP 111/74   Pulse 90   Temp 97.6 F (36.4 C)   Resp 14   Ht 5' 2.5 (1.588 m)   Wt 184 lb 9.6 oz (83.7 kg)   BMI 33.23 kg/m    Physical Exam Vitals and nursing note reviewed.  Constitutional:      Appearance: She is well-developed.  HENT:     Head: Normocephalic and atraumatic.  Eyes:     Conjunctiva/sclera: Conjunctivae normal.  Cardiovascular:     Rate and Rhythm: Normal rate and regular rhythm.     Heart sounds: Normal heart sounds.  Pulmonary:     Effort: Pulmonary effort is normal.     Breath sounds: Normal breath sounds.  Abdominal:     General: Bowel sounds are normal.     Palpations: Abdomen is soft.  Musculoskeletal:     Cervical back: Normal range of motion.  Skin:    General: Skin is warm and  dry.     Capillary Refill: Capillary refill takes less than 2 seconds.  Neurological:     Mental Status: She is alert and oriented to person, place, and time.  Psychiatric:        Behavior: Behavior normal.      Musculoskeletal Exam: Cervical spine was in good range of motion.  Thoracic kyphosis was noted.  She had no tenderness over thoracic or lumbar spine.  Shoulders, elbows, wrist joints in good range of motion.  She had no tenderness over MCPs.  She had tenderness over her right CMC.  Bilateral DIP thickening was noted.  She had painful limited range of motion of her right hip joint.  She also had some limitation in the left hip joint.  She had difficulty with extension of her right knee joint.  Left knee joint was in good range of motion.  There was no tenderness over ankles or MTPs.  CDAI Exam: CDAI Score: -- Patient Global: --; Provider Global: -- Swollen: --; Tender: -- Joint Exam 09/29/2024   No joint exam has been documented for this visit   There is currently no information documented on the homunculus. Go to the Rheumatology activity and complete the homunculus joint exam.  Investigation: No additional  findings.  Imaging: No results found.  Recent Labs: Lab Results  Component Value Date   WBC 4.0 04/26/2024   HGB 12.5 04/26/2024   PLT 232 04/26/2024   NA 135 04/26/2024   K 4.2 04/26/2024   CL 103 04/26/2024   CO2 26 04/26/2024   GLUCOSE 120 (H) 04/26/2024   BUN 15 04/26/2024   CREATININE 0.70 04/26/2024   BILITOT 0.5 04/26/2024   AST 21 04/26/2024   ALT 7 04/26/2024   PROT 7.5 04/26/2024   PROT 7.6 04/26/2024   CALCIUM 9.6 04/26/2024   GFRAA 80 10/24/2020      Speciality Comments: PLQ Eye Exam: 05/06/2024 WNL, [facility] Follow up 6 months  Procedures:  No procedures performed Allergies: Codeine   Assessment / Plan:     Visit Diagnoses: Sjogren's syndrome with keratoconjunctivitis sicca - +ANA, +Ro, +La, +RF, sicca symptoms, fatigue: -She continues to have dry mouth and dry eye symptoms.  Over-the-counter products were reviewed.  She has been using Restasis.  She has been seeing dentist and ophthalmologist on a regular basis.  She denies any interruption in Plaquenil  therapy.  Labs obtained on April 26, 2024 were reviewed.  Double-stranded ENA was negative complements normal.  Rheumatoid factor was negative.  SSA and SSB antibodies remain positive.  I will check labs today.  Plan: Protein / creatinine ratio, urine, Sedimentation rate, C3 and C4  High risk medication use - Plaquenil  200 mg 1 tablet by mouth twice daily monday through friday. PLQ Eye Exam: 05/06/2024 - Plan: CBC with Differential/Platelet, Comprehensive metabolic panel with GFR.  Information regarding the immunization was placed in the AVS.  Positive ANA (antinuclear antibody) - ANA 1: 1280 nuclear, fine speckled, Ro>8, La>8, RF 15: Recent rheumatoid factor was negative.  Livedo reticularis  Primary osteoarthritis of both hands- bilateral DIP thickening was noted.  She has been experiencing some discomfort in the right CMC joint.  CMC brace was advised.  I would avoid cortisone injection as she has  right hip joint surgery coming up.  Primary osteoarthritis of right hip - She is scheduled for right total hip replacement on October 12, 2024.  She has some limitation to range of motion of her left hip joint.  Chronic  pain of left knee - MRI of the left knee was performed on 05/11/2022: Mild tricompartmental degenerative change.  She has osteoarthritis in both knees.  Other fatigue-she continues to have some fatigue.  Fibromyalgia-she continues to have some generalized pain and myalgias.  Osteopenia of multiple sites - DEXA previously ordered by PCP- 08/30/2019 which showed T score -1.6.May 17, 2024 DEXA scan T-score -1.9, BMD 0.771 left femoral neck.  DEXA scan results were discussed with the patient.  She has been taking calcium and vitamin D.  Medical problems listed as follows:  Hair loss  Family history of systemic lupus erythematosus (SLE) in mother  Obstructive sleep apnea syndrome  Dyslipidemia  History of depression  Former smoker  Orders: Orders Placed This Encounter  Procedures   Protein / creatinine ratio, urine   CBC with Differential/Platelet   Comprehensive metabolic panel with GFR   Sedimentation rate   C3 and C4   No orders of the defined types were placed in this encounter.    Follow-Up Instructions: Return in about 5 months (around 02/27/2025) for Sjogren's, Osteoarthritis.   Maya Nash, MD  Note - This record has been created using Animal nutritionist.  Chart creation errors have been sought, but may not always  have been located. Such creation errors do not reflect on  the standard of medical care.     [1]  Social History Tobacco Use   Smoking status: Former    Current packs/day: 0.00    Average packs/day: 1 pack/day for 30.0 years (30.0 ttl pk-yrs)    Types: Cigarettes    Start date: 09/02/1980    Quit date: 09/02/2010    Years since quitting: 14.0    Passive exposure: Never   Smokeless tobacco: Never  Vaping Use   Vaping  status: Never Used  Substance Use Topics   Alcohol use: Not Currently    Alcohol/week: 1.0 standard drink of alcohol    Comment: wine maybe every 6 months   Drug use: Never   "

## 2024-09-16 NOTE — Telephone Encounter (Signed)
 Patient had appointment with Dr.Olalere and these were reviewed.NFN

## 2024-09-19 ENCOUNTER — Other Ambulatory Visit (HOSPITAL_BASED_OUTPATIENT_CLINIC_OR_DEPARTMENT_OTHER): Payer: Self-pay | Admitting: Student

## 2024-09-19 DIAGNOSIS — F339 Major depressive disorder, recurrent, unspecified: Secondary | ICD-10-CM

## 2024-09-19 DIAGNOSIS — Z8659 Personal history of other mental and behavioral disorders: Secondary | ICD-10-CM

## 2024-09-29 ENCOUNTER — Ambulatory Visit: Admitting: Rheumatology

## 2024-09-29 ENCOUNTER — Encounter: Payer: Self-pay | Admitting: Orthopaedic Surgery

## 2024-09-29 ENCOUNTER — Encounter: Payer: Self-pay | Admitting: Rheumatology

## 2024-09-29 VITALS — BP 111/74 | HR 90 | Temp 97.6°F | Resp 14 | Ht 62.5 in | Wt 184.6 lb

## 2024-09-29 DIAGNOSIS — M797 Fibromyalgia: Secondary | ICD-10-CM | POA: Diagnosis not present

## 2024-09-29 DIAGNOSIS — R7689 Other specified abnormal immunological findings in serum: Secondary | ICD-10-CM | POA: Diagnosis not present

## 2024-09-29 DIAGNOSIS — M19042 Primary osteoarthritis, left hand: Secondary | ICD-10-CM

## 2024-09-29 DIAGNOSIS — R231 Pallor: Secondary | ICD-10-CM

## 2024-09-29 DIAGNOSIS — M8589 Other specified disorders of bone density and structure, multiple sites: Secondary | ICD-10-CM

## 2024-09-29 DIAGNOSIS — M19041 Primary osteoarthritis, right hand: Secondary | ICD-10-CM

## 2024-09-29 DIAGNOSIS — G8929 Other chronic pain: Secondary | ICD-10-CM

## 2024-09-29 DIAGNOSIS — M3501 Sicca syndrome with keratoconjunctivitis: Secondary | ICD-10-CM | POA: Diagnosis not present

## 2024-09-29 DIAGNOSIS — M1611 Unilateral primary osteoarthritis, right hip: Secondary | ICD-10-CM | POA: Diagnosis not present

## 2024-09-29 DIAGNOSIS — E785 Hyperlipidemia, unspecified: Secondary | ICD-10-CM

## 2024-09-29 DIAGNOSIS — M25562 Pain in left knee: Secondary | ICD-10-CM | POA: Diagnosis not present

## 2024-09-29 DIAGNOSIS — G4733 Obstructive sleep apnea (adult) (pediatric): Secondary | ICD-10-CM

## 2024-09-29 DIAGNOSIS — L659 Nonscarring hair loss, unspecified: Secondary | ICD-10-CM

## 2024-09-29 DIAGNOSIS — Z79899 Other long term (current) drug therapy: Secondary | ICD-10-CM | POA: Diagnosis not present

## 2024-09-29 DIAGNOSIS — R5383 Other fatigue: Secondary | ICD-10-CM | POA: Diagnosis not present

## 2024-09-29 DIAGNOSIS — Z8659 Personal history of other mental and behavioral disorders: Secondary | ICD-10-CM

## 2024-09-29 DIAGNOSIS — Z87891 Personal history of nicotine dependence: Secondary | ICD-10-CM

## 2024-09-29 DIAGNOSIS — Z8269 Family history of other diseases of the musculoskeletal system and connective tissue: Secondary | ICD-10-CM

## 2024-09-29 NOTE — Patient Instructions (Signed)
 Vaccines You are taking a medication(s) that can suppress your immune system.  The following immunizations are recommended: Flu annually Covid-19  Td/Tdap (tetanus, diphtheria, pertussis) every 10 years Pneumonia (Prevnar 15 then Pneumovax 23 at least 1 year apart.  Alternatively, can take Prevnar 20 without needing additional dose) Shingrix: 2 doses from 4 weeks to 6 months apart  Please check with your PCP to make sure you are up to date.

## 2024-09-30 ENCOUNTER — Other Ambulatory Visit: Payer: Self-pay | Admitting: Physician Assistant

## 2024-09-30 ENCOUNTER — Ambulatory Visit: Payer: Self-pay | Admitting: Physician Assistant

## 2024-09-30 DIAGNOSIS — Z01818 Encounter for other preprocedural examination: Secondary | ICD-10-CM

## 2024-09-30 LAB — COMPREHENSIVE METABOLIC PANEL WITH GFR
AG Ratio: 1.2 (calc) (ref 1.0–2.5)
ALT: 5 U/L — ABNORMAL LOW (ref 6–29)
AST: 21 U/L (ref 10–35)
Albumin: 4.4 g/dL (ref 3.6–5.1)
Alkaline phosphatase (APISO): 48 U/L (ref 37–153)
BUN: 18 mg/dL (ref 7–25)
CO2: 27 mmol/L (ref 20–32)
Calcium: 10.2 mg/dL (ref 8.6–10.4)
Chloride: 102 mmol/L (ref 98–110)
Creat: 0.91 mg/dL (ref 0.60–1.00)
Globulin: 3.8 g/dL — ABNORMAL HIGH (ref 1.9–3.7)
Glucose, Bld: 102 mg/dL — ABNORMAL HIGH (ref 65–99)
Potassium: 4.3 mmol/L (ref 3.5–5.3)
Sodium: 137 mmol/L (ref 135–146)
Total Bilirubin: 0.4 mg/dL (ref 0.2–1.2)
Total Protein: 8.2 g/dL — ABNORMAL HIGH (ref 6.1–8.1)
eGFR: 67 mL/min/{1.73_m2}

## 2024-09-30 LAB — CBC WITH DIFFERENTIAL/PLATELET
Absolute Lymphocytes: 864 {cells}/uL (ref 850–3900)
Absolute Monocytes: 354 {cells}/uL (ref 200–950)
Basophils Absolute: 92 {cells}/uL (ref 0–200)
Basophils Relative: 2.7 %
Eosinophils Absolute: 211 {cells}/uL (ref 15–500)
Eosinophils Relative: 6.2 %
HCT: 40 % (ref 35.9–46.0)
Hemoglobin: 12.9 g/dL (ref 11.7–15.5)
MCH: 26.7 pg — ABNORMAL LOW (ref 27.0–33.0)
MCHC: 32.3 g/dL (ref 31.6–35.4)
MCV: 82.8 fL (ref 81.4–101.7)
MPV: 10.6 fL (ref 7.5–12.5)
Monocytes Relative: 10.4 %
Neutro Abs: 1880 {cells}/uL (ref 1500–7800)
Neutrophils Relative %: 55.3 %
Platelets: 230 10*3/uL (ref 140–400)
RBC: 4.83 Million/uL (ref 3.80–5.10)
RDW: 13.1 % (ref 11.0–15.0)
Total Lymphocyte: 25.4 %
WBC: 3.4 10*3/uL — ABNORMAL LOW (ref 3.8–10.8)

## 2024-09-30 LAB — C3 AND C4
C3 Complement: 123 mg/dL (ref 83–193)
C4 Complement: 23 mg/dL (ref 15–57)

## 2024-09-30 LAB — PROTEIN / CREATININE RATIO, URINE
Creatinine, Urine: 116 mg/dL (ref 20–275)
Protein/Creat Ratio: 95 mg/g{creat} (ref 24–184)
Protein/Creatinine Ratio: 0.095 mg/mg{creat} (ref 0.024–0.184)
Total Protein, Urine: 11 mg/dL (ref 5–24)

## 2024-09-30 LAB — SEDIMENTATION RATE: Sed Rate: 38 mm/h — ABNORMAL HIGH (ref 0–30)

## 2024-09-30 NOTE — Progress Notes (Signed)
 " Cardiology Office Note:    Date:  10/01/2024   ID:  Kaitlyn Hudson, DOB [birth year], MRN [id number]  PCP:  Iven Lang DASEN, PA-C  Cardiologist:  Redell Leiter, MD    Referring MD: Iven Lang DASEN, PA-C    ASSESSMENT:    1. CAD in native artery   2. Mixed hyperlipidemia   3. Chronic obstructive pulmonary disease, unspecified COPD type (HCC)   4. Sjogren's syndrome with keratoconjunctivitis sicca    PLAN:    In order of problems listed above:  Stable doing well on medical therapy I have no anginal discomfort she will continue aspirin  lipid-lowering therapy including pravastatin  Reassuring echocardiogram no findings of heart failure or pulmonary artery hypertension She is optimized for planned surgical procedure intermediate risk elective and if appropriate same-day surgery Stable Sjogren's and COPD   Next appointment: 1 year follow-up with me   Medication Adjustments/Labs and Tests Ordered: Current medicines are reviewed at length with the patient today.  Concerns regarding medicines are outlined above.  No orders of the defined types were placed in this encounter.  No orders of the defined types were placed in this encounter.    History of Present Illness:    [name] is a 74 y.o. female with a hx of CAD hyperlipidemia Sjogren syndrome COPD and exertional shortness of breath last seen 07/01/2024.  CTA in 2018 showed moderate right coronary artery and LAD stenosis with normal FFR with a calcium score of 102. After that visit proBNP level was low and the rule out heart failure range.  Her echocardiogram showed left ventricle normal size wall thickness EF 60 to 65% and diastolic function. She is pending arthroplasty of the hip. Compliance with diet, lifestyle and medications: Yes  She is highly motivated to have total hip arthroplasty despite pain in the hip her exercise tolerance is greater than 4-5 METS she still does heavy housework Is not experiencing shortness of  breath chest pain palpitation or syncope If needed she can withhold aspirin  pre and postoperatively Past Medical History:  Diagnosis Date   Allergic rhinitis    Allergy    Angina pectoris 05/15/2018   Anxiety 04/02/2016   See comments on depression above   CAD in native artery 06/26/2018   Cataract    Chest pressure 05/11/2018   Depression    I was prescribed medication when I stopped smoking, not for depression. I have recently stopped taking it.   Dyslipidemia 04/08/2018   Emphysema lung (HCC) 06/28/2024   Exertional shortness of breath 05/11/2018   Family history of systemic lupus erythematosus (SLE) in mother 04/08/2018   Fibromyalgia    Former smoker 04/08/2018   GERD (gastroesophageal reflux disease)    History of cervical dysplasia 04/02/2016   2013; hysterectomy d/t recurrent dysplasia s/p LEEP   History of depression 03/16/2018   History of hyperlipidemia 03/16/2018   Hypercholesteremia 04/02/2016   Osteoarthritis    Pain in right hip 03/28/2022   Primary osteoarthritis of both hands 04/08/2018   Sjogren's syndrome with keratoconjunctivitis sicca 04/08/2018   +ANA, +Ro, +La, sicca symptoms, fatigue   Sleep apnea    SOB (shortness of breath) on exertion 05/11/2018   Splenomegaly 06/28/2024   Trochanteric bursitis, right hip 01/09/2018   Unilateral primary osteoarthritis, left knee 05/23/2022    Current Medications: Active Medications[1]    EKGs/Labs/Other Studies Reviewed:    The following studies were reviewed today:  Cardiac Studies & Procedures   ______________________________________________________________________________________________     ECHOCARDIOGRAM  ECHOCARDIOGRAM COMPLETE 07/27/2024  Narrative ECHOCARDIOGRAM REPORT    Patient Name:   Kaitlyn Hudson Date of Exam: 07/27/2024 Medical Rec #:  [id number]   Height:       62.0 in Accession #:    [id number]  Weight:       190.8 lb Date of Birth:  23-Apr-1951  BSA:          1.874 m Patient Age:     74 years    BP:           118/56 mmHg Patient Gender: F           HR:           63 bpm. Exam Location:  [place]  Procedure: 2D Echo, 3D Echo, Cardiac Doppler, Color Doppler and Strain Analysis (Both Spectral and Color Flow Doppler were utilized during procedure).  Indications:    CAD in native artery [I25.10 (ICD-10-CM)], Mixed hyperlipidemia [E78.2 (ICD-10-CM)], Sjogren's syndrome with keratoconjunctivitis sicca [M35.01 (ICD-10-CM)], Chronic obstructive pulmonary disease, unspecified COPD type (HCC) [J44.9 (ICD-10-CM)]  History:        Patient has prior history of Echocardiogram examinations, most recent 05/13/2018. CAD, COPD, Signs/Symptoms:Shortness of Breath; Risk Factors:Dyslipidemia.  Sonographer:    Charlie Jointer RDCS Referring Phys: [id number] Alastair Hennes J Shyloh Krinke  IMPRESSIONS   1. Left ventricular ejection fraction, by estimation, is 60 to 65%. The left ventricle has normal function. The left ventricle has no regional wall motion abnormalities. Left ventricular diastolic parameters were normal. The average left ventricular global longitudinal strain is -21.7 %. The global longitudinal strain is normal. 2. Right ventricular systolic function is normal. The right ventricular size is normal. There is mildly elevated pulmonary artery systolic pressure. 3. The mitral valve is normal in structure. Mild mitral valve regurgitation. No evidence of mitral stenosis. 4. The aortic valve is normal in structure. Aortic valve regurgitation is not visualized. No aortic stenosis is present. 5. The inferior vena cava is normal in size with greater than 50% respiratory variability, suggesting right atrial pressure of 3 mmHg.  FINDINGS Left Ventricle: Left ventricular ejection fraction, by estimation, is 60 to 65%. The left ventricle has normal function. The left ventricle has no regional wall motion abnormalities. The average left ventricular global longitudinal strain is -21.7 %. Strain was  performed and the global longitudinal strain is normal. The left ventricular internal cavity size was normal in size. There is no left ventricular hypertrophy. Left ventricular diastolic parameters were normal.  Right Ventricle: The right ventricular size is normal. No increase in right ventricular wall thickness. Right ventricular systolic function is normal. There is mildly elevated pulmonary artery systolic pressure. The tricuspid regurgitant velocity is 3.05 m/s, and with an assumed right atrial pressure of 3 mmHg, the estimated right ventricular systolic pressure is 40.2 mmHg.  Left Atrium: Left atrial size was normal in size.  Right Atrium: Right atrial size was normal in size.  Pericardium: There is no evidence of pericardial effusion.  Mitral Valve: The mitral valve is normal in structure. Mild mitral valve regurgitation. No evidence of mitral valve stenosis.  Tricuspid Valve: The tricuspid valve is normal in structure. Tricuspid valve regurgitation is mild . No evidence of tricuspid stenosis.  Aortic Valve: The aortic valve is normal in structure. Aortic valve regurgitation is not visualized. No aortic stenosis is present.  Pulmonic Valve: The pulmonic valve was normal in structure. Pulmonic valve regurgitation is not visualized. No evidence of pulmonic stenosis.  Aorta: The aortic root is normal in size  and structure.  Venous: The inferior vena cava is normal in size with greater than 50% respiratory variability, suggesting right atrial pressure of 3 mmHg.  IAS/Shunts: No atrial level shunt detected by color flow Doppler.   LEFT VENTRICLE PLAX 2D LVIDd:         4.00 cm   Diastology LVIDs:         2.60 cm   LV e' medial:    11.77 cm/s LV PW:         0.90 cm   LV E/e' medial:  8.5 LV IVS:        1.00 cm   LV e' lateral:   9.42 cm/s LVOT diam:     2.00 cm   LV E/e' lateral: 10.6 LV SV:         67 LV SV Index:   36        2D Longitudinal Strain LVOT Area:     3.14 cm  2D  Strain GLS Avg:     -21.7 %  3D Volume EF: 3D EF:        63 % LV EDV:       109 ml LV ESV:       40 ml LV SV:        69 ml  RIGHT VENTRICLE             IVC RV Basal diam:  4.60 cm     IVC diam: 1.20 cm RV Mid diam:    3.30 cm RV S prime:     13.90 cm/s TAPSE (M-mode): 2.9 cm  LEFT ATRIUM             Index        RIGHT ATRIUM           Index LA diam:        3.60 cm 1.92 cm/m   RA Area:     17.00 cm LA Vol (A2C):   47.4 ml 25.30 ml/m  RA Volume:   40.20 ml  21.45 ml/m LA Vol (A4C):   65.2 ml 34.80 ml/m LA Biplane Vol: 56.9 ml 30.37 ml/m AORTIC VALVE LVOT Vmax:   104.00 cm/s LVOT Vmean:  66.250 cm/s LVOT VTI:    0.214 m  AORTA Ao Root diam: 3.00 cm Ao Asc diam:  2.70 cm Ao Desc diam: 2.20 cm  MITRAL VALVE                  TRICUSPID VALVE MV Area (PHT): 4.53 cm       TR Peak grad:   37.2 mmHg MV Decel Time: 168 msec       TR Vmax:        305.00 cm/s MR Peak grad:    157.8 mmHg MR Mean grad:    104.0 mmHg   SHUNTS MR Vmax:         628.00 cm/s  Systemic VTI:  0.21 m MR Vmean:        473.0 cm/s   Systemic Diam: 2.00 cm MR PISA:         1.01 cm MR PISA Eff ROA: 6 mm MR PISA Radius:  0.40 cm MV E velocity: 99.90 cm/s MV A velocity: 52.35 cm/s MV E/A ratio:  1.91  Lamar Fitch MD Electronically signed by Lamar Fitch MD Signature Date/Time: 07/27/2024/10:28:13 AM    Final      CT SCANS  CT CORONARY FRACTIONAL FLOW RESERVE DATA PREP 06/15/2018  Narrative EXAM: CT FFR  ANALYSIS  CLINICAL DATA:  Reason for exam: angina pectoris  FINDINGS: FFRct analysis was performed on the original cardiac CT angiogram dataset. Diagrammatic representation of the FFRct analysis is provided in a separate PDF document in PACS. This dictation was created using the PDF document and an interactive 3D model of the results. 3D model is not available in the EMR/PACS. Normal FFR range is >0.80.  1. Left Main:  No significant stenosis.  FFR = 0.94  2. LAD: Ostial LAD  FFR =0.92, proximal LAD FFR = 0.92, mid LAD FFR = 0.84. There is distal LAD disease with FFR = 0.7 3. LCX: No significant stenosis. FFR proximal circumflex = 0.96, distal circumflex 0.94 4. RCA: No significant stenosis. Proximal RCA FFR 0.97, mid RCA FFR = 0.92, distal RCA FFR = 0.86  IMPRESSION: 1. CT FFR analysis showed no significant mid LAD stenosis with FFR = 0.84. Diffuse distal LAD disease with terminal LAD FFR = 0.7. No significant stenosis in the RCA or Left Circumflex arteries.   Electronically Signed By: Soyla Merck On: 06/23/2018 13:44   CT SCANS  CT CORONARY MORPH W/CTA COR W/SCORE 06/11/2018  Addendum 06/16/2018  8:05 AM ADDENDUM REPORT: 06/14/2018 18:27  EXAM: Cardiac/Coronary  CT  TECHNIQUE: The patient was scanned on a [employer].  PROTOCOL: A 120 kV prospective scan was triggered in the descending thoracic aorta at 111 HU's. Axial non-contrast 3 mm slices were carried out through the heart. The data set was analyzed on a dedicated work station and scored using the Agatson method. Gantry rotation speed was 250 msecs and collimation was .6 mm. No beta blockade and 0.8 mg of sl NTG was given. The 3D data set was reconstructed in 5% intervals of the 67-82 % of the R-R cycle. Diastolic phases were analyzed on a dedicated work station using MPR, MIP and VRT modes. The patient received 80 cc of contrast.  FINDINGS: Coronary calcium score: The coronary calcium score was 202, which places the patient in the 88th percentile. This number is greater than expected as compared to age and gender matched peers.  Coronary arteries: Normal coronary origin.  Right dominance.  Right Coronary Artery: Mild mixed atherosclerosis throughout the RCA (25-49 percent stenosis.) There is a moderate mixed atherosclerotic lesion in the mid RCA (50-69 percent stenosis). Patent small caliber PDA and posterolateral branches with scattered atherosclerosis.  Left  Main Coronary Artery: Minimal ostial left main coronary artery disease (<25% stenosis).  Left Anterior Descending Coronary Artery: Mild ostial LAD mixed atherosclerosis (25-49% stenosis), mild proximal LAD noncalcified atherosclerosis (25-49% stenosis) just proximal to the takeoff of the first septal perforator. There is a moderate mixed atherosclerotic lesion in the mid LAD (50-69 % stenosis). Mild scattered mixed atherosclerosis in the distal LAD (25-49% stenosis). Patent diagonal branches.  Left Circumflex Artery: Mild mixed atherosclerosis in the proximal and early portion of the distal left circumflex artery. There is a prominent calcification just proximal to the takeoff of the first obtuse marginal branch, likely creating mild stenosis (25-49 percent stenosis). The OM 1 is patent.  Aorta:  Normal size.  No calcifications.  No dissection.  Aortic Valve: Trileaflet. Mild calcifications at the aortic annulus.  Other findings:  Normal pulmonary vein drainage into the left atrium.  Normal let atrial appendage without a thrombus.  The region of interest on this exam does not allow for complete visualization of the pulmonary artery for size nor the mid ascending aorta.  IMPRESSION: 1. Coronary calcium score of 202.  This was 88th percentile for age and sex matched control.  2. Normal coronary origin with right dominance.  3. Moderate coronary artery disease in the mid RCA and mid LAD, 50-69% stenosis with mixed plaque. CADRADS = 3.  Soyla Merck   Electronically Signed By: Soyla Merck On: 06/14/2018 18:27  Narrative EXAM: OVER-READ INTERPRETATION  CT CHEST  The following report is an over-read performed by radiologist Dr. Toribio Aye of [facility] on 06/11/2018. This over-read does not include interpretation of cardiac or coronary anatomy or pathology. The coronary calcium score/coronary CTA interpretation by the cardiologist is  attached.  COMPARISON:  None.  FINDINGS: Within the visualized portions of the thorax there are no suspicious appearing pulmonary nodules or masses, there is no acute consolidative airspace disease, no pleural effusions, no pneumothorax and no lymphadenopathy. Visualized portions of the upper abdomen are unremarkable. There are no aggressive appearing lytic or blastic lesions noted in the visualized portions of the skeleton.  IMPRESSION: No significant incidental noncardiac findings are noted.  Electronically Signed: By: Toribio Aye M.D. On: 06/11/2018 15:17     ______________________________________________________________________________________________          Recent Labs: 07/01/2024: NT-Pro BNP 192 09/29/2024: ALT 5; BUN 18; Creat 0.91; Hemoglobin 12.9; Platelets 230; Potassium 4.3; Sodium 137  Recent Lipid Panel    Component Value Date/Time   CHOL 176 06/28/2024 1000   TRIG 271 (H) 06/28/2024 1000   HDL 37 (L) 06/28/2024 1000   CHOLHDL 4.8 (H) 06/28/2024 1000   LDLCALC 93 06/28/2024 1000    Physical Exam:    VS:  BP (!) 102/56   Pulse 80   Ht 5' 2.5 (1.588 m)   Wt 184 lb 6.4 oz (83.6 kg)   SpO2 98%   BMI 33.19 kg/m     Wt Readings from Last 3 Encounters:  10/01/24 184 lb 6.4 oz (83.6 kg)  09/29/24 184 lb 9.6 oz (83.7 kg)  09/13/24 185 lb 12.8 oz (84.3 kg)     GEN:  Well nourished, well developed in no acute distress HEENT: Normal NECK: No JVD; No carotid bruits LYMPHATICS: No lymphadenopathy CARDIAC: RRR, no murmurs, rubs, gallops RESPIRATORY:  Clear to auscultation without rales, wheezing or rhonchi  ABDOMEN: Soft, non-tender, non-distended MUSCULOSKELETAL:  No edema; No deformity  SKIN: Warm and dry NEUROLOGIC:  Alert and oriented x 3 PSYCHIATRIC:  Normal affect    Signed, Redell Leiter, MD  10/01/2024 11:26 AM    [facility]      [1]  Current Meds  Medication Sig   Acetaminophen (TYLENOL PO) Take by  mouth as needed.   albuterol  (VENTOLIN  HFA) 108 (90 Base) MCG/ACT inhaler Inhale 2 puffs into the lungs every 6 (six) hours as needed for wheezing or shortness of breath.   aspirin  EC 81 MG tablet Take 1 tablet (81 mg total) by mouth daily.   Calcium Carbonate-Vit D-Min (CALTRATE 600+D PLUS MINIS PO) 1,200 mg daily. 1200mg  Ca+ & unsure of D   cetirizine (ZYRTEC) 10 MG tablet Take 10 mg by mouth daily.   Esomeprazole Magnesium (NEXIUM PO) Take 40 mg by mouth daily.   fluticasone  (FLONASE ) 50 MCG/ACT nasal spray Place 1 spray into both nostrils daily.   hydroxychloroquine  (PLAQUENIL ) 200 MG tablet TAKE ONE TABLET BY MOUTH TWICE DAILY MONDAY THROUGH FRIDAY   Polyethyl Glycol-Propyl Glycol (SYSTANE OP) Apply to eye.   pravastatin  (PRAVACHOL ) 40 MG tablet Take 1 tablet (40 mg total) by mouth daily.   RESTASIS  0.05 % ophthalmic emulsion    sertraline  (ZOLOFT ) 100 MG tablet Take 1 tablet (100 mg total) by mouth daily.   "

## 2024-10-01 ENCOUNTER — Encounter: Payer: Self-pay | Admitting: Cardiology

## 2024-10-01 ENCOUNTER — Ambulatory Visit: Attending: Cardiology | Admitting: Cardiology

## 2024-10-01 VITALS — BP 102/56 | HR 80 | Ht 62.5 in | Wt 184.4 lb

## 2024-10-01 DIAGNOSIS — E782 Mixed hyperlipidemia: Secondary | ICD-10-CM

## 2024-10-01 DIAGNOSIS — J449 Chronic obstructive pulmonary disease, unspecified: Secondary | ICD-10-CM

## 2024-10-01 DIAGNOSIS — I2510 Atherosclerotic heart disease of native coronary artery without angina pectoris: Secondary | ICD-10-CM | POA: Diagnosis not present

## 2024-10-01 DIAGNOSIS — M3501 Sicca syndrome with keratoconjunctivitis: Secondary | ICD-10-CM | POA: Diagnosis not present

## 2024-10-01 NOTE — Patient Instructions (Signed)

## 2024-10-07 NOTE — Pre-Procedure Instructions (Signed)
 Surgical Instructions   Your procedure is scheduled on October 12, 2024. Report to [facility] Main Entrance A at 5:30 A.M., then check in with the Admitting office. Any questions or running late day of surgery: call [phone]  Questions prior to your surgery date: call [phone], Monday-Friday, 8am-4pm. If you experience any cold or flu symptoms such as cough, fever, chills, shortness of breath, etc. between now and your scheduled surgery, please notify us  at the above number.     Remember:  Do not eat after midnight the night before your surgery  You may drink clear liquids until 4:30 AM the morning of your surgery.   Clear liquids allowed are: Water, Non-Citrus Juices (without pulp), Carbonated Beverages, Clear Tea (no milk, honey, etc.), Black Coffee Only (NO MILK, CREAM OR POWDERED CREAMER of any kind), and Gatorade.  Patient Instructions  The night before surgery:  No food after midnight. ONLY clear liquids after midnight  The day of surgery (if you do NOT have diabetes):  Drink ONE (1) Pre-Surgery Clear Ensure by 4:30 AM the morning of surgery. Drink in one sitting. Do not sip.  This drink was given to you during your hospital  pre-op appointment visit.  Nothing else to drink after completing the  Pre-Surgery Clear Ensure.         If you have questions, please contact your surgeons office.    Take these medicines the morning of surgery with A SIP OF WATER: fluticasone  (FLONASE ) nasal spray  omeprazole (PRILOSEC OTC)  pravastatin  (PRAVACHOL )  RESTASIS ophthalmic emulsion  sertraline  (ZOLOFT )    May take these medicines IF NEEDED: acetaminophen (TYLENOL)    Please follow your prescribing physician's instructions on stopping hydroxychloroquine  (PLAQUENIL ).   Follow your surgeon's instructions on when to stop Aspirin .  If no instructions were given by your surgeon then you will need to call the office to get those instructions.     One week prior to  surgery, STOP taking any Aleve, Naproxen, Ibuprofen, Motrin, Advil, Goody's, BC's, all herbal medications, fish oil, and non-prescription vitamins.                     Do NOT Smoke (Tobacco/Vaping) for 24 hours prior to your procedure.  If you use a CPAP at night, you may bring your mask/headgear for your overnight stay.   You will be asked to remove any contacts, glasses, piercing's, hearing aid's, dentures/partials prior to surgery. Please bring cases for these items if needed.    Your surgeon will determine if you are to be admitted or discharged the same day.  Patients discharged the day of surgery will not be allowed to drive home, and someone needs to stay with them for 24 hours.  SURGICAL WAITING ROOM VISITATION Patients may have no more than 2 support people in the waiting area - these visitors may rotate.   Pre-op nurse will coordinate an appropriate time for 2 ADULT support persons, who may not rotate, to accompany patient in pre-op.  Children under the age of 13 must have an adult with them who is not the patient and must remain in the main waiting area with an adult.  If the patient needs to stay at the hospital during part of their recovery, the visitor guidelines for inpatient rooms apply.  Please refer to the [facility] website for the visitor guidelines for any additional information.   If you received a COVID test during your pre-op visit  it is requested that you  wear a mask when out in public, stay away from anyone that may not be feeling well and notify your surgeon if you develop symptoms. If you have been in contact with anyone that has tested positive in the last 10 days please notify you surgeon.      Pre-operative 4 CHG Bathing Instructions   You can play a key role in reducing the risk of infection after surgery. Your skin needs to be as free of germs as possible. You can reduce the number of germs on your skin by washing with CHG (chlorhexidine gluconate) soap  before surgery. CHG is an antiseptic soap that kills germs and continues to kill germs even after washing.   DO NOT use if you have an allergy to chlorhexidine/CHG or antibacterial soaps. If your skin becomes reddened or irritated, stop using the CHG and notify one of our RNs at [phone].   Please shower with the CHG soap starting 4 days before surgery using the following schedule:     Please keep in mind the following:  DO NOT shave, including legs and underarms, starting the day of your first shower.   You may shave your face at any point before/day of surgery.  Place clean sheets on your bed the day you start using CHG soap. Use a clean washcloth (not used since being washed) for each shower. DO NOT sleep with pets once you start using the CHG.   CHG Shower Instructions:  Wash your face and private area with normal soap. If you choose to wash your hair, wash first with your normal shampoo.  After you use shampoo/soap, rinse your hair and body thoroughly to remove shampoo/soap residue.  Turn the water OFF and apply  bottle of CHG soap to a CLEAN washcloth.  Apply CHG soap ONLY FROM YOUR NECK DOWN TO YOUR TOES (washing for 3-5 minutes)  DO NOT use CHG soap on face, private areas, open wounds, or sores.  Pay special attention to the area where your surgery is being performed.  If you are having back surgery, having someone wash your back for you may be helpful. Wait 2 minutes after CHG soap is applied, then you may rinse off the CHG soap.  Pat dry with a clean towel  Put on clean clothes/pajamas   If you choose to wear lotion, please use ONLY the CHG-compatible lotions that are listed below.  Additional instructions for the day of surgery:  If you choose, you may shower the morning of surgery with an antibacterial soap.  DO NOT APPLY any lotions, deodorants, cologne, or perfumes.   Do not bring valuables to the hospital. [facility] is not responsible for any  belongings/valuables. Do not wear nail polish, gel polish, artificial nails, or any other type of covering on natural nails (fingers and toes) Do not wear jewelry or makeup Put on clean/comfortable clothes.  Please brush your teeth.  Ask your nurse before applying any prescription medications to the skin.     CHG Compatible Lotions   Aveeno Moisturizing lotion  Cetaphil Moisturizing Cream  Cetaphil Moisturizing Lotion  Clairol Herbal Essence Moisturizing Lotion, Dry Skin  Clairol Herbal Essence Moisturizing Lotion, Extra Dry Skin  Clairol Herbal Essence Moisturizing Lotion, Normal Skin  Curel Age Defying Therapeutic Moisturizing Lotion with Alpha Hydroxy  Curel Extreme Care Body Lotion  Curel Soothing Hands Moisturizing Hand Lotion  Curel Therapeutic Moisturizing Cream, Fragrance-Free  Curel Therapeutic Moisturizing Lotion, Fragrance-Free  Curel Therapeutic Moisturizing Lotion, Original Formula  Eucerin Daily Replenishing  Lotion  Eucerin Dry Skin Therapy Plus Alpha Hydroxy Crme  Eucerin Dry Skin Therapy Plus Alpha Hydroxy Lotion  Eucerin Original Crme  Eucerin Original Lotion  Eucerin Plus Crme Eucerin Plus Lotion  Eucerin TriLipid Replenishing Lotion  Keri Anti-Bacterial Hand Lotion  Keri Deep Conditioning Original Lotion Dry Skin Formula Softly Scented  Keri Deep Conditioning Original Lotion, Fragrance Free Sensitive Skin Formula  Keri Lotion Fast Absorbing Fragrance Free Sensitive Skin Formula  Keri Lotion Fast Absorbing Softly Scented Dry Skin Formula  Keri Original Lotion  Keri Skin Renewal Lotion Keri Silky Smooth Lotion  Keri Silky Smooth Sensitive Skin Lotion  Nivea Body Creamy Conditioning Oil  Nivea Body Extra Enriched Lotion  Nivea Body Original Lotion  Nivea Body Sheer Moisturizing Lotion Nivea Crme  Nivea Skin Firming Lotion  NutraDerm 30 Skin Lotion  NutraDerm Skin Lotion  NutraDerm Therapeutic Skin Cream  NutraDerm Therapeutic Skin Lotion  ProShield  Protective Hand Cream  Provon moisturizing lotion  Please read over the following fact sheets that you were given.

## 2024-10-08 ENCOUNTER — Inpatient Hospital Stay (HOSPITAL_COMMUNITY): Admission: RE | Admit: 2024-10-08

## 2024-10-08 ENCOUNTER — Encounter (HOSPITAL_COMMUNITY): Payer: Self-pay

## 2024-10-08 ENCOUNTER — Other Ambulatory Visit: Payer: Self-pay

## 2024-10-08 VITALS — BP 119/63 | HR 85 | Temp 98.3°F | Resp 16 | Ht 62.5 in | Wt 180.0 lb

## 2024-10-08 DIAGNOSIS — Z01818 Encounter for other preprocedural examination: Secondary | ICD-10-CM

## 2024-10-08 LAB — CBC
HCT: 38.4 % (ref 36.0–46.0)
Hemoglobin: 12.2 g/dL (ref 12.0–15.0)
MCH: 26.8 pg (ref 26.0–34.0)
MCHC: 31.8 g/dL (ref 30.0–36.0)
MCV: 84.2 fL (ref 80.0–100.0)
Platelets: 200 10*3/uL (ref 150–400)
RBC: 4.56 MIL/uL (ref 3.87–5.11)
RDW: 13.7 % (ref 11.5–15.5)
WBC: 3.7 10*3/uL — ABNORMAL LOW (ref 4.0–10.5)
nRBC: 0 % (ref 0.0–0.2)

## 2024-10-08 LAB — TYPE AND SCREEN
ABO/RH(D): O NEG
Antibody Screen: NEGATIVE

## 2024-10-08 LAB — COMPREHENSIVE METABOLIC PANEL WITH GFR
ALT: <5 U/L (ref 0–44)
AST: 23 U/L (ref 15–41)
Albumin: 4.1 g/dL (ref 3.5–5.0)
Alkaline Phosphatase: 55 U/L (ref 38–126)
Anion gap: 11 (ref 5–15)
BUN: 20 mg/dL (ref 8–23)
CO2: 22 mmol/L (ref 22–32)
Calcium: 10.2 mg/dL (ref 8.9–10.3)
Chloride: 104 mmol/L (ref 98–111)
Creatinine, Ser: 1 mg/dL (ref 0.44–1.00)
GFR, Estimated: 60 mL/min — ABNORMAL LOW
Glucose, Bld: 98 mg/dL (ref 70–99)
Potassium: 4.2 mmol/L (ref 3.5–5.1)
Sodium: 137 mmol/L (ref 135–145)
Total Bilirubin: 0.4 mg/dL (ref 0.0–1.2)
Total Protein: 7.9 g/dL (ref 6.5–8.1)

## 2024-10-08 LAB — SURGICAL PCR SCREEN
MRSA, PCR: NEGATIVE
Staphylococcus aureus: NEGATIVE

## 2024-10-08 NOTE — Progress Notes (Signed)
 PCP - Lang Alberta, PA-C Cardiologist - Dr. Redell Leiter - last office visit 10/01/2024 Rheumatologist - Dr. Maya Nash - last office visit 09/29/2024  PPM/ICD - Denies Device Orders - n/a Rep Notified - n/a  Chest x-ray - n/a EKG - 07/01/2024 Stress Test - Denies ECHO - 07/27/2024 Cardiac Cath - Denies  Sleep Study - Per pt, sleep study showed mild OSA. Per MD, pt did not require CPAP  No DM  Last dose of GLP1 agonist- n/a GLP1 instructions: n/a   Blood Thinner Instructions: n/a Aspirin  Instructions: Pt instructed to continue taking ASA through the day before surgery and none the morning of surgery (instructions per [facility] with Dr. Damian office)  ERAS Protcol - Clear liquids until 0430 morning of surgery PRE-SURGERY Ensure or G2- Ensure given to pt with instructions  COVID TEST- n/a   Anesthesia review: Yes. Cardiac clearance. Pt instructed to contact Rheumatologist for instructions regarding hydroxychloroquine  (PLAQUENIL ).  Patient denies shortness of breath, fever, cough and chest pain at PAT appointment. Pt denies any respiratory illness/infection in the last two months.   All instructions explained to the patient, with a verbal understanding of the material. Patient agrees to go over the instructions while at home for a better understanding. Patient also instructed to self quarantine after being tested for COVID-19. The opportunity to ask questions was provided.

## 2024-10-12 ENCOUNTER — Encounter (HOSPITAL_COMMUNITY): Admission: RE | Payer: Self-pay | Source: Home / Self Care

## 2024-10-12 ENCOUNTER — Ambulatory Visit (HOSPITAL_COMMUNITY): Admission: RE | Admit: 2024-10-12 | Admitting: Orthopaedic Surgery

## 2024-10-25 ENCOUNTER — Encounter: Admitting: Orthopaedic Surgery

## 2024-12-27 ENCOUNTER — Ambulatory Visit (HOSPITAL_BASED_OUTPATIENT_CLINIC_OR_DEPARTMENT_OTHER): Admitting: Student

## 2025-01-12 ENCOUNTER — Ambulatory Visit: Admitting: Pulmonary Disease

## 2025-03-01 ENCOUNTER — Ambulatory Visit: Admitting: Rheumatology
